# Patient Record
Sex: Female | Born: 1990 | Race: White | Hispanic: No | Marital: Single | Smoking: Never smoker
Health system: Southern US, Community
[De-identification: ages and names within clinical notes are randomized; demographics above are authoritative.]

## PROBLEM LIST (undated history)

## (undated) DIAGNOSIS — Z83719 Family history of colon polyps, unspecified: Secondary | ICD-10-CM

## (undated) DIAGNOSIS — F329 Major depressive disorder, single episode, unspecified: Secondary | ICD-10-CM

## (undated) DIAGNOSIS — Z808 Family history of malignant neoplasm of other organs or systems: Secondary | ICD-10-CM

## (undated) DIAGNOSIS — J45909 Unspecified asthma, uncomplicated: Secondary | ICD-10-CM

## (undated) DIAGNOSIS — Z8371 Family history of colonic polyps: Secondary | ICD-10-CM

## (undated) DIAGNOSIS — F419 Anxiety disorder, unspecified: Secondary | ICD-10-CM

## (undated) DIAGNOSIS — R112 Nausea with vomiting, unspecified: Secondary | ICD-10-CM

## (undated) DIAGNOSIS — K900 Celiac disease: Secondary | ICD-10-CM

## (undated) DIAGNOSIS — Z803 Family history of malignant neoplasm of breast: Secondary | ICD-10-CM

## (undated) DIAGNOSIS — T8859XA Other complications of anesthesia, initial encounter: Secondary | ICD-10-CM

## (undated) DIAGNOSIS — G43909 Migraine, unspecified, not intractable, without status migrainosus: Secondary | ICD-10-CM

## (undated) DIAGNOSIS — Z800 Family history of malignant neoplasm of digestive organs: Secondary | ICD-10-CM

## (undated) DIAGNOSIS — F32A Depression, unspecified: Secondary | ICD-10-CM

## (undated) HISTORY — DX: Family history of malignant neoplasm of other organs or systems: Z80.8

## (undated) HISTORY — DX: Depression, unspecified: F32.A

## (undated) HISTORY — DX: Family history of colonic polyps: Z83.71

## (undated) HISTORY — DX: Family history of colon polyps, unspecified: Z83.719

## (undated) HISTORY — DX: Family history of malignant neoplasm of digestive organs: Z80.0

## (undated) HISTORY — PX: WRIST SURGERY: SHX841

## (undated) HISTORY — PX: WISDOM TOOTH EXTRACTION: SHX21

## (undated) HISTORY — DX: Celiac disease: K90.0

## (undated) HISTORY — DX: Migraine, unspecified, not intractable, without status migrainosus: G43.909

## (undated) HISTORY — DX: Major depressive disorder, single episode, unspecified: F32.9

## (undated) HISTORY — DX: Family history of malignant neoplasm of breast: Z80.3

---
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
## 2008-01-07 ENCOUNTER — Ambulatory Visit: Payer: Self-pay | Admitting: Family Medicine

## 2008-01-07 DIAGNOSIS — G43909 Migraine, unspecified, not intractable, without status migrainosus: Secondary | ICD-10-CM | POA: Insufficient documentation

## 2008-07-13 ENCOUNTER — Telehealth: Payer: Self-pay | Admitting: Family Medicine

## 2008-10-14 ENCOUNTER — Ambulatory Visit: Payer: Self-pay | Admitting: Family Medicine

## 2008-10-14 ENCOUNTER — Other Ambulatory Visit: Admission: RE | Admit: 2008-10-14 | Discharge: 2008-10-14 | Payer: Self-pay | Admitting: Family Medicine

## 2008-10-14 ENCOUNTER — Encounter: Payer: Self-pay | Admitting: Family Medicine

## 2008-10-14 DIAGNOSIS — N938 Other specified abnormal uterine and vaginal bleeding: Secondary | ICD-10-CM | POA: Insufficient documentation

## 2008-10-14 DIAGNOSIS — N949 Unspecified condition associated with female genital organs and menstrual cycle: Secondary | ICD-10-CM

## 2009-01-03 ENCOUNTER — Ambulatory Visit: Payer: Self-pay | Admitting: Family Medicine

## 2009-07-21 ENCOUNTER — Telehealth: Payer: Self-pay | Admitting: Family Medicine

## 2009-10-28 ENCOUNTER — Ambulatory Visit: Payer: Self-pay | Admitting: Family Medicine

## 2009-10-28 ENCOUNTER — Other Ambulatory Visit: Admission: RE | Admit: 2009-10-28 | Discharge: 2009-10-28 | Payer: Self-pay | Admitting: Family Medicine

## 2009-10-28 DIAGNOSIS — L301 Dyshidrosis [pompholyx]: Secondary | ICD-10-CM | POA: Insufficient documentation

## 2009-10-28 LAB — CONVERTED CEMR LAB: Pap Smear: NEGATIVE

## 2010-04-18 NOTE — Progress Notes (Signed)
Summary: Refills singulair  Phone Note Refill Request Message from:  Pharmacy on Jul 21, 2009 3:34 PM  Refills Requested: Medication #1:  SINGULAIR 10 MG TABS once daily Initial call taken by: Kathrynn Speed CMA,  Jul 21, 2009 3:35 PM    Prescriptions: SINGULAIR 10 MG TABS (MONTELUKAST SODIUM) once daily  #90 x 2   Entered by:   Kathrynn Speed CMA   Authorized by:   Roderick Pee MD   Signed by:   Kathrynn Speed CMA on 07/21/2009   Method used:   Electronically to        [facility]* (retail)       [address]       [place], [place]  [address]       Ph: [phone]       Fax: [phone]   RxID:   [id number]

## 2010-04-18 NOTE — Assessment & Plan Note (Signed)
Summary: cpx/ pap/mm   Vital Signs:  Patient profile:   20 year old female Menstrual status:  regular Height:      62 inches Weight:      106 pounds BMI:     19.46 Temp:     98.6 degrees F oral BP sitting:   98 / 70  (left arm) Cuff size:   regular  Vitals Entered By: Kathrynn Speed CMA (October 28, 2009 9:24 AM) CC: cxp, pap, src Is Patient Diabetic? No   CC:  cxp, pap, and src.  History of Present Illness: Pamela Norton is a 20 year old, married female, nonsmoker, who comes in today for general physical examination because of dysfunction uterine bleeding.  Migraine headaches, allergic rhinitis.  The dysfunction uterine bleeding is treated with BCPs 135.  Doing well.  No side effects, actually it's also decreased her migraines.  She takes an occasional Maxalt, but they decreased in frequency and severity since she started the BCPs.  For her allergy symptoms.  She takes Allegra 180 daily, Singulair 10 daily, Nasacort, and Astelin sprays also, prior p.r.n.  She's also on allergy shots weekly from her allergist.  Review of systems negative except for some eczema and a stress fracture in her left ankle, which is being treated by the orthopedist.  Tetanus 2007 seasonal flu shot 2010 meningitis vaccine 2007  Preventive Screening-Counseling & Management  Alcohol-Tobacco     Smoking Status: never     Passive Smoke Exposure: no  Current Medications (verified): 1)  Maxalt-Mlt 10 Mg  Tbdp (Rizatriptan Benzoate) .... Uad As Needed 2)  Allegra 180 Mg Tabs (Fexofenadine Hcl) .... Take One Tab Once Daily 3)  Singulair 10 Mg Tabs (Montelukast Sodium) .... Once Daily 4)  Nasacort Aq 55 Mcg/act Aers (Triamcinolone Acetonide(Nasal)) .... Once Daily 5)  Astelin 137 Mcg/spray Soln (Azelastine Hcl) .... Once Daily 6)  Proair Hfa 108 (90 Base) Mcg/act Aers (Albuterol Sulfate) .... Once Daily 7)  Allergy Shots .... Once A Week 8)  Zovia 1/35e (28) 1-35 Mg-Mcg Tabs (Ethynodiol Diac-Eth Estradiol) ....  Uad  Allergies (verified): 1)  ! Ceclor  Past History:  Past medical, surgical, family and social histories (including risk factors) reviewed, and no changes noted (except as noted below).  Past Medical History: Reviewed history from 09/26/2006 and no changes required. Allergic Rhinitis Asthma Headaches  Family History: Reviewed history from 09/26/2006 and no changes required. Family History of Hypertension Fam hx DM Fam hx Fibromyalgia  Social History: Reviewed history from 01/07/2008 and no changes required. Negative history of passive tobacco smoke exposure.  Not using alcohol Not using substances of abuse student at [employer], [place], double major Investment banker, corporate.  History and drama  Review of Systems      See HPI  Physical Exam  General:  Well-developed,well-nourished,in no acute distress; alert,appropriate and cooperative throughout examination Head:  Normocephalic and atraumatic without obvious abnormalities. No apparent alopecia or balding. Eyes:  No corneal or conjunctival inflammation noted. EOMI. Perrla. Funduscopic exam benign, without hemorrhages, exudates or papilledema. Vision grossly normal. Ears:  External ear exam shows no significant lesions or deformities.  Otoscopic examination reveals clear canals, tympanic membranes are intact bilaterally without bulging, retraction, inflammation or discharge. Hearing is grossly normal bilaterally. Nose:  External nasal examination shows no deformity or inflammation. Nasal mucosa are pink and moist without lesions or exudates. Mouth:  Oral mucosa and oropharynx without lesions or exudates.  Teeth in good repair. Neck:  No deformities, masses, or tenderness noted. Chest Wall:  No  deformities, masses, or tenderness noted. Breasts:  No mass, nodules, thickening, tenderness, bulging, retraction, inflamation, nipple discharge or skin changes noted.   Lungs:  Normal respiratory effort, chest expands symmetrically.  Lungs are clear to auscultation, no crackles or wheezes. Heart:  Normal rate and regular rhythm. S1 and S2 normal without gallop, murmur, click, rub or other extra sounds. Abdomen:  Bowel sounds positive,abdomen soft and non-tender without masses, organomegaly or hernias noted. Genitalia:  Pelvic Exam:        External: normal female genitalia without lesions or masses        Vagina: normal without lesions or masses        Cervix: normal without lesions or masses        Adnexa: normal bimanual exam without masses or fullness        Uterus: normal by palpation        Pap smear: performed Msk:  No deformity or scoliosis noted of thoracic or lumbar spine.   Pulses:  R and L carotid,radial,femoral,dorsalis pedis and posterior tibial pulses are full and equal bilaterally Extremities:  No clubbing, cyanosis, edema, or deformity noted with normal full range of motion of all joints.   Neurologic:  No cranial nerve deficits noted. Station and gait are normal. Plantar reflexes are down-going bilaterally. DTRs are symmetrical throughout. Sensory, motor and coordinative functions appear intact. Skin:  Intact without suspicious lesions or rashes Cervical Nodes:  No lymphadenopathy noted Axillary Nodes:  No palpable lymphadenopathy Inguinal Nodes:  No significant adenopathy Psych:  Cognition and judgment appear intact. Alert and cooperative with normal attention span and concentration. 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 Uad as needed  Orders: Prescription Created Electronically [phone])  Problem # 3:  WELL ADULT EXAM (ICD-V70.0) Assessment:  Unchanged  Orders: Prescription Created Electronically [phone])  Problem # 4:  ASTHMA (ICD-493.90) Assessment: Improved  Her updated medication list for this problem includes:    Singulair 10 Mg Tabs (Montelukast sodium) ..... Once daily    Proair Hfa 108 (90 Base) Mcg/act Aers (Albuterol sulfate) ..... Once daily  Problem # 5:  S/P ALLERGIC RHINITIS (ICD-477.9) Assessment: Improved  Her updated medication list for this problem includes:    Allegra 180 Mg Tabs (Fexofenadine hcl) .Marland Kitchen... Take one tab once daily    Nasacort Aq 55 Mcg/act Aers (Triamcinolone acetonide(nasal)) ..... Once daily    Astelin 137 Mcg/spray Soln (Azelastine hcl) ..... Once daily  Problem # 6:  DYSHIDROSIS (ICD-705.81) Assessment: New  Orders: Prescription Created Electronically [phone])  Complete Medication List: 1)  Maxalt-mlt 10 Mg Tbdp (Rizatriptan benzoate) .... Uad as needed 2)  Allegra 180 Mg Tabs (Fexofenadine hcl) .... Take one tab once daily 3)  Singulair 10 Mg Tabs (Montelukast sodium) .... Once daily 4)  Nasacort Aq 55 Mcg/act Aers (Triamcinolone acetonide(nasal)) .... Once daily 5)  Astelin 137 Mcg/spray Soln (Azelastine hcl) .... Once daily 6)  Proair Hfa 108 (90 Base) Mcg/act Aers (Albuterol sulfate) .... Once daily 7)  Allergy Shots  .... Once a week 8)  Zovia 1/35e (28) 1-35 Mg-mcg Tabs (Ethynodiol diac-eth estradiol) .... Uad 9)  Triamcinolone Acetonide 0.1 % Oint (Triamcinolone acetonide) .... Apply two times a day as needed  Patient Instructions: 1)  use udder crm/and cortisone cream twice daily as needed for outbreaks of the skin rash. 2)  Please schedule a follow-up appointment in 1 year. Prescriptions: ZOVIA 1/35E (28) 1-35 MG-MCG TABS (ETHYNODIOL DIAC-ETH ESTRADIOL) UAD  #3 paks x 4   Entered and Authorized by:   Roderick Pee MD   Signed by:   Roderick Pee MD on 10/28/2009   Method used:   Electronically to        [facility] [phone]* (retail)       [address]        [place], [place]  [address]       Ph: [phone] or [phone]       Fax: [phone]   RxID:   [id number] MAXALT-MLT 10 MG  TBDP (RIZATRIPTAN BENZOATE) uad as needed  #6 x 11   Entered and Authorized by:   Roderick Pee MD   Signed by:   Roderick Pee MD on 10/28/2009   Method used:   Electronically to        [facility] [phone]* (retail)       [address]       [place], [place]  [address]       Ph: [phone] or [phone]       Fax: [phone]   RxID:   [id number] TRIAMCINOLONE ACETONIDE 0.1 % OINT (TRIAMCINOLONE ACETONIDE) apply two times a day as needed  #60 gr x 2   Entered and Authorized by:   Roderick Pee MD   Signed by:   Roderick Pee MD on 10/28/2009   Method used:   Electronically to        [facility] [phone]* (retail)       [address]       [place], [place]  [address]       Ph: [phone] or [phone]       Fax: [phone]   RxID:   [id number]

## 2010-08-04 NOTE — Assessment & Plan Note (Signed)
[facility]                              [facility] OFFICE NOTE   NAME:Pamela Norton, Pamela Norton                       MRN:          [id number]  DATE:10/16/2005                            DOB:          [birth year]    This is a first [facility] visit for this 20 year old female who comes in  accompanied by her mother for evaluation of numerous issues.   PAST MEDICAL HISTORY, PAST HOSPITALIZATIONS:  None.   OUTPATIENT SURGERY:  None.   ILLNESSES:  None.   INJURY:  None.   DRUG ALLERGIES:  Ceclor gives her hives.   Does not smoke or drink any alcohol or use any street drugs.   CURRENT MEDICATIONS:  1.  Allegra 180 q. day.  2.  Singulair 10 q. day.  3.  Afrin nasal spray b.i.d.  4.  Nasacort nasal spray.  5.  She also uses Atarax 10 mg on a p.r.n. basis.  6.  Albuterol p.r.n. for asthma.  7.  She says she uses maybe 2 cans of  albuterol in a 12 month period of      time.  8.  She also sees Dr. Stevphen Rochester for allergy shots.   REVIEW OF SYSTEMS:  She has migraine headaches which she would like to  discuss.  Her migraines are without ora.  They are not menstrual and they  occur maybe once to three times a year but sometimes they only last two or  three days.  She tried over-the-counter medications and they do not help and  would like to discuss prescription medicine.  HEENT:  Negative.  Regular  dental care.  CARDIOPULMONARY:  Negative.  GASTROINTESTINAL:  Negative.  GENITOURINARY:  Negative.  GYN:  Gravida 0, para 0. LMP July 17 to 21.  Menses are regular, no problems.  Minimal cramps first day.  She is not  sexually active.  ENDOCRINOLOGY:  Negative.  Weight steady.  MUSCULOSKELETAL:  Negative except she is a horse back rider and we talked  about that in relationship to her total health, head injuries, etc.  She  said she wears a helmet.  VASCULAR:  Negative.  ALLERGY:  Positive as noted  above.  SKIN:  Negative.   SOCIAL HISTORY:  She is a  Holiday representative at [employer].  Her GPA is 4.6.  She  is not sure where she is going to school after high school.   FAMILY HISTORY:  Dad has a PhD in good health.  Mother has multiple medical  problems including fibromyalgia, hypertension, hyperlipidemia, obesity and  diabetes.  No brothers or sisters.   VACCINATION HISTORY TO DATE:  Mother does not have the records but knows she  needs a DT and wants a meningitis vaccine.  She said her insurance denied  the Gardasil.   PHYSICAL EVALUATION:  VITAL SIGNS:  Height 5 foot 2 inches.  Weight 113  pounds.  Pulse was 70 and regular.  Temperature 99.  GENERAL:  She is a well developed, well nourished white female in no acute  distress.  HEENT:  Negative.  NECK:  Supple.  Thyroid is not enlarged.  CHEST:  Clear to auscultation.  CARDIAC:  Negative.  ABDOMEN:  Negative.  EXTREMITIES: No ischemia.  Peripheral pulses normal.   LABORATORY DATA:  None indicated.   IMPRESSION:  Allergic rhinitis.   PLAN:  1.  Continue Allegra 180 q. day.  2.  Asthma - continue Singulair 10 q. day.  Nasonex nasal spray, Nasacort      and allergy shots.  Also Atarax p.r.n.  Also albuterol p.r.n.  3.  Migraine headaches.  We had a discussion of migraines and she opts for      Zomig 5 mg ZMT.  She knows how to use it.  Will give her a trial.  If      this controls her migraines we will do nothing further.  If however, it      does not she will come back to see [place].                                   Jeffrey A. Tawanna Cooler, MD   JAT/MedQ  DD:  10/16/2005  DT:  10/16/2005  Job #:  [id number]

## 2011-03-23 ENCOUNTER — Other Ambulatory Visit (HOSPITAL_COMMUNITY)
Admission: RE | Admit: 2011-03-23 | Discharge: 2011-03-23 | Disposition: A | Discharge status: Home / Self Care | Payer: BC Managed Care – PPO | Source: Ambulatory Visit | Attending: Family Medicine | Admitting: Family Medicine

## 2011-03-23 DIAGNOSIS — Z124 Encounter for screening for malignant neoplasm of cervix: Secondary | ICD-10-CM | POA: Insufficient documentation

## 2011-03-28 ENCOUNTER — Other Ambulatory Visit: Payer: Self-pay | Admitting: Physician Assistant

## 2012-05-13 ENCOUNTER — Other Ambulatory Visit (HOSPITAL_COMMUNITY)
Admission: RE | Admit: 2012-05-13 | Discharge: 2012-05-13 | Disposition: A | Discharge status: Home / Self Care | Payer: BC Managed Care – PPO | Source: Ambulatory Visit | Attending: Family Medicine | Admitting: Family Medicine

## 2012-05-13 ENCOUNTER — Other Ambulatory Visit: Payer: Self-pay | Admitting: Physician Assistant

## 2012-05-13 DIAGNOSIS — Z124 Encounter for screening for malignant neoplasm of cervix: Secondary | ICD-10-CM | POA: Insufficient documentation

## 2012-09-18 ENCOUNTER — Emergency Department (HOSPITAL_COMMUNITY)
Admission: EM | Admit: 2012-09-18 | Discharge: 2012-09-18 | Disposition: A | Discharge status: Home / Self Care | Payer: BC Managed Care – PPO | Attending: Emergency Medicine | Admitting: Emergency Medicine

## 2012-09-18 ENCOUNTER — Encounter (HOSPITAL_COMMUNITY): Payer: Self-pay | Admitting: Emergency Medicine

## 2012-09-18 DIAGNOSIS — R42 Dizziness and giddiness: Secondary | ICD-10-CM | POA: Insufficient documentation

## 2012-09-18 DIAGNOSIS — J45901 Unspecified asthma with (acute) exacerbation: Secondary | ICD-10-CM | POA: Insufficient documentation

## 2012-09-18 DIAGNOSIS — R059 Cough, unspecified: Secondary | ICD-10-CM | POA: Insufficient documentation

## 2012-09-18 DIAGNOSIS — Z8659 Personal history of other mental and behavioral disorders: Secondary | ICD-10-CM | POA: Insufficient documentation

## 2012-09-18 DIAGNOSIS — R05 Cough: Secondary | ICD-10-CM | POA: Insufficient documentation

## 2012-09-18 DIAGNOSIS — R0789 Other chest pain: Secondary | ICD-10-CM | POA: Insufficient documentation

## 2012-09-18 DIAGNOSIS — J4521 Mild intermittent asthma with (acute) exacerbation: Secondary | ICD-10-CM

## 2012-09-18 HISTORY — DX: Anxiety disorder, unspecified: F41.9

## 2012-09-18 HISTORY — DX: Unspecified asthma, uncomplicated: J45.909

## 2012-09-18 MED ORDER — ALBUTEROL SULFATE (5 MG/ML) 0.5% IN NEBU
2.5000 mg | INHALATION_SOLUTION | Freq: Once | RESPIRATORY_TRACT | Status: AC
  Administered 2012-09-18: 2.5 mg via RESPIRATORY_TRACT
  Filled 2012-09-18: qty 0.5

## 2012-09-18 MED ORDER — IPRATROPIUM BROMIDE 0.02 % IN SOLN
0.5000 mg | Freq: Once | RESPIRATORY_TRACT | Status: DC
  Filled 2012-09-18: qty 2.5

## 2012-09-18 MED ORDER — DEXAMETHASONE SODIUM PHOSPHATE 10 MG/ML IJ SOLN
10.0000 mg | Freq: Once | INTRAMUSCULAR | Status: AC
  Administered 2012-09-18: 10 mg via INTRAMUSCULAR
  Filled 2012-09-18: qty 1

## 2012-09-18 NOTE — ED Notes (Addendum)
Pt reports difficulty breathing and reports that her asthma has flared up. Pt reports a history of asthma. Pt O2 saturation on room air is 98%.  Wheezing heard upon ascultation to the bilateral lung bases. NAD. Pt reports having a non-productive cough.

## 2012-09-18 NOTE — ED Notes (Signed)
After IM administration pt c/o lightheaded and dizziness. Pt laid in supine position and VS obtained. HR 73 BP 85/46. Abigail PA notified. Pt VS's to be reassessed before discharge.

## 2012-09-18 NOTE — ED Provider Notes (Signed)
History    This chart was scribed for non-physician practitioner working with Derwood Kaplan, MD by Leone Payor, ED Scribe. This patient was seen in room WTR5/WTR5 and the patient's care was started at 1708.  CSN: [id number] Arrival date & time 09/18/12  1708  None    No chief complaint on file.   The history is provided by the patient. No language interpreter was used.    HPI Comments: Pamela Norton is a 22 y.o. female with past medical history of asthma, who presents to the Emergency Department complaining of constant, unchanged, non-radiating, central chest tightness starting today at 12:30pm. Has tried to use her rescue inhaler without relief. Pain is worse with deep breathing. She has some associated lightheadedness, cough. Reports taking birth control pills. Denies being pregnant. States she has h/o anxiety but this pain is unlike that. Denies recent travel, h/o blood clots. Mother has h/o DVT/PE. Denies hemoptysis, sore throat. She has a history of anxiety and panic attack.  Pt denies having any sick contacts with similar symptoms.    No past medical history on file. No past surgical history on file. No family history on file. History  Substance Use Topics  . Smoking status: Not on file  . Smokeless tobacco: Not on file  . Alcohol Use: Not on file   OB History   No data available     Review of Systems  Constitutional: Negative for fever and chills.  HENT: Negative for congestion, sore throat, rhinorrhea, trouble swallowing, voice change and sinus pressure.   Eyes: Negative for discharge and itching.  Respiratory: Positive for cough, chest tightness and shortness of breath. Negative for wheezing and stridor.   Cardiovascular: Negative for chest pain.  Gastrointestinal: Negative for nausea, vomiting and abdominal pain.  Genitourinary: Negative for dysuria.  Musculoskeletal: Negative for myalgias and arthralgias.  Skin: Negative for rash.  Neurological: Positive for  light-headedness.    Allergies  Cefaclor  Home Medications  No current outpatient prescriptions on file. BP 123/80  Pulse 83  Temp(Src) 98.4 F (36.9 C) (Oral)  Resp 14  Ht 5\' 2"  (1.575 m)  Wt 105 lb (47.628 kg)  BMI 19.2 kg/m2  SpO2 98% Physical Exam  Nursing note and vitals reviewed. Constitutional: She is oriented to person, place, and time. She appears well-developed and well-nourished. No distress.  HENT:  Head: Normocephalic and atraumatic.  Eyes: EOM are normal.  Neck: Normal range of motion. Neck supple. No tracheal deviation present.  Cardiovascular: Normal rate, regular rhythm and normal heart sounds.   Pulmonary/Chest: Effort normal. No respiratory distress. She exhibits no tenderness.  Dry cough noted.   Rhonchi noted. Upper airway clear with cough. Very mild expiratory wheezes. No increased respiratory effort or use of accessory muscles.   Musculoskeletal: Normal range of motion.  Lymphadenopathy:    She has no cervical adenopathy.  Neurological: She is alert and oriented to person, place, and time.  Skin: Skin is warm and dry.  Multiple, well-healed scars that appear to self-inflicted from superficial lacerations to the L anterior forearm.   Psychiatric: She has a normal mood and affect. Her behavior is normal.    ED Course  Procedures (including critical care time)  DIAGNOSTIC STUDIES: Oxygen Saturation is 98% on RA, normal by my interpretation.    COORDINATION OF CARE: 5:38 PM Discussed treatment plan with pt at bedside and pt agreed to plan.   Labs Reviewed - No data to display No results found. 1. Asthma exacerbation, mild  intermittent     MDM  no current signs of respiratory distress. Lung exam improved after nebulizer treatment. Patient feels better. Decadron given in the ED. Pt states they are breathing at baseline. Pt has been instructed to continue using prescribed medications and to speak with PCP about today's exacerbation.    I  personally performed the services described in this documentation, which was scribed in my presence. The recorded information has been reviewed and is accurate.    Arthor Captain, PA-C 09/25/12 1339

## 2012-09-26 NOTE — ED Provider Notes (Signed)
Medical screening examination/treatment/procedure(s) were performed by non-physician practitioner and as supervising physician I was immediately available for consultation/collaboration.  Carle Fenech, MD 09/26/12 0728 

## 2012-10-01 ENCOUNTER — Other Ambulatory Visit: Payer: Self-pay | Admitting: Allergy and Immunology

## 2012-10-01 ENCOUNTER — Other Ambulatory Visit: Payer: Self-pay

## 2012-10-01 ENCOUNTER — Ambulatory Visit
Admission: RE | Admit: 2012-10-01 | Discharge: 2012-10-01 | Disposition: A | Discharge status: Home / Self Care | Payer: BC Managed Care – PPO | Source: Ambulatory Visit | Attending: Allergy and Immunology | Admitting: Allergy and Immunology

## 2012-10-01 DIAGNOSIS — R079 Chest pain, unspecified: Secondary | ICD-10-CM

## 2012-10-01 DIAGNOSIS — R05 Cough: Secondary | ICD-10-CM

## 2012-10-01 DIAGNOSIS — R059 Cough, unspecified: Secondary | ICD-10-CM

## 2013-05-26 ENCOUNTER — Other Ambulatory Visit: Payer: Self-pay | Admitting: Physician Assistant

## 2013-05-26 ENCOUNTER — Other Ambulatory Visit (HOSPITAL_COMMUNITY)
Admission: RE | Admit: 2013-05-26 | Discharge: 2013-05-26 | Disposition: A | Discharge status: Home / Self Care | Payer: BC Managed Care – PPO | Source: Ambulatory Visit | Attending: Family Medicine | Admitting: Family Medicine

## 2013-05-26 DIAGNOSIS — Z124 Encounter for screening for malignant neoplasm of cervix: Secondary | ICD-10-CM | POA: Insufficient documentation

## 2014-05-28 ENCOUNTER — Other Ambulatory Visit (HOSPITAL_COMMUNITY)
Admission: RE | Admit: 2014-05-28 | Discharge: 2014-05-28 | Disposition: A | Discharge status: Home / Self Care | Payer: BLUE CROSS/BLUE SHIELD | Source: Ambulatory Visit | Attending: Family Medicine | Admitting: Family Medicine

## 2014-05-28 ENCOUNTER — Other Ambulatory Visit: Payer: Self-pay | Admitting: Physician Assistant

## 2014-05-28 DIAGNOSIS — Z124 Encounter for screening for malignant neoplasm of cervix: Secondary | ICD-10-CM | POA: Insufficient documentation

## 2014-06-01 LAB — CYTOLOGY - PAP

## 2015-02-22 DIAGNOSIS — M654 Radial styloid tenosynovitis [de Quervain]: Secondary | ICD-10-CM | POA: Insufficient documentation

## 2015-02-25 ENCOUNTER — Other Ambulatory Visit: Payer: Self-pay | Admitting: Allergy and Immunology

## 2015-02-25 DIAGNOSIS — J0191 Acute recurrent sinusitis, unspecified: Secondary | ICD-10-CM

## 2015-03-08 ENCOUNTER — Ambulatory Visit
Admission: RE | Admit: 2015-03-08 | Discharge: 2015-03-08 | Disposition: A | Discharge status: Home / Self Care | Payer: BLUE CROSS/BLUE SHIELD | Source: Ambulatory Visit | Attending: Allergy and Immunology | Admitting: Allergy and Immunology

## 2015-03-08 DIAGNOSIS — J0191 Acute recurrent sinusitis, unspecified: Secondary | ICD-10-CM

## 2015-06-09 ENCOUNTER — Other Ambulatory Visit: Payer: Self-pay | Admitting: Physician Assistant

## 2015-06-09 ENCOUNTER — Other Ambulatory Visit (HOSPITAL_COMMUNITY)
Admission: RE | Admit: 2015-06-09 | Discharge: 2015-06-09 | Disposition: A | Discharge status: Home / Self Care | Payer: BLUE CROSS/BLUE SHIELD | Source: Ambulatory Visit | Attending: Family Medicine | Admitting: Family Medicine

## 2015-06-09 DIAGNOSIS — Z124 Encounter for screening for malignant neoplasm of cervix: Secondary | ICD-10-CM | POA: Insufficient documentation

## 2015-06-13 LAB — CYTOLOGY - PAP

## 2015-06-22 DIAGNOSIS — J3081 Allergic rhinitis due to animal (cat) (dog) hair and dander: Secondary | ICD-10-CM | POA: Diagnosis not present

## 2015-06-22 DIAGNOSIS — J3089 Other allergic rhinitis: Secondary | ICD-10-CM | POA: Diagnosis not present

## 2015-06-22 DIAGNOSIS — J301 Allergic rhinitis due to pollen: Secondary | ICD-10-CM | POA: Diagnosis not present

## 2015-07-04 DIAGNOSIS — K900 Celiac disease: Secondary | ICD-10-CM | POA: Diagnosis not present

## 2015-07-06 DIAGNOSIS — J3089 Other allergic rhinitis: Secondary | ICD-10-CM | POA: Diagnosis not present

## 2015-07-06 DIAGNOSIS — J301 Allergic rhinitis due to pollen: Secondary | ICD-10-CM | POA: Diagnosis not present

## 2015-07-06 DIAGNOSIS — J3081 Allergic rhinitis due to animal (cat) (dog) hair and dander: Secondary | ICD-10-CM | POA: Diagnosis not present

## 2015-07-13 DIAGNOSIS — J3081 Allergic rhinitis due to animal (cat) (dog) hair and dander: Secondary | ICD-10-CM | POA: Diagnosis not present

## 2015-07-13 DIAGNOSIS — J301 Allergic rhinitis due to pollen: Secondary | ICD-10-CM | POA: Diagnosis not present

## 2015-07-13 DIAGNOSIS — J3089 Other allergic rhinitis: Secondary | ICD-10-CM | POA: Diagnosis not present

## 2015-07-20 DIAGNOSIS — J301 Allergic rhinitis due to pollen: Secondary | ICD-10-CM | POA: Diagnosis not present

## 2015-07-20 DIAGNOSIS — J3089 Other allergic rhinitis: Secondary | ICD-10-CM | POA: Diagnosis not present

## 2015-07-20 DIAGNOSIS — J3081 Allergic rhinitis due to animal (cat) (dog) hair and dander: Secondary | ICD-10-CM | POA: Diagnosis not present

## 2015-07-27 DIAGNOSIS — J3089 Other allergic rhinitis: Secondary | ICD-10-CM | POA: Diagnosis not present

## 2015-07-27 DIAGNOSIS — J301 Allergic rhinitis due to pollen: Secondary | ICD-10-CM | POA: Diagnosis not present

## 2015-07-27 DIAGNOSIS — J3081 Allergic rhinitis due to animal (cat) (dog) hair and dander: Secondary | ICD-10-CM | POA: Diagnosis not present

## 2015-08-03 DIAGNOSIS — J3081 Allergic rhinitis due to animal (cat) (dog) hair and dander: Secondary | ICD-10-CM | POA: Diagnosis not present

## 2015-08-03 DIAGNOSIS — J301 Allergic rhinitis due to pollen: Secondary | ICD-10-CM | POA: Diagnosis not present

## 2015-08-03 DIAGNOSIS — J3089 Other allergic rhinitis: Secondary | ICD-10-CM | POA: Diagnosis not present

## 2015-08-10 DIAGNOSIS — J301 Allergic rhinitis due to pollen: Secondary | ICD-10-CM | POA: Diagnosis not present

## 2015-08-10 DIAGNOSIS — J3089 Other allergic rhinitis: Secondary | ICD-10-CM | POA: Diagnosis not present

## 2015-08-10 DIAGNOSIS — J3081 Allergic rhinitis due to animal (cat) (dog) hair and dander: Secondary | ICD-10-CM | POA: Diagnosis not present

## 2015-08-17 DIAGNOSIS — J301 Allergic rhinitis due to pollen: Secondary | ICD-10-CM | POA: Diagnosis not present

## 2015-08-17 DIAGNOSIS — J3089 Other allergic rhinitis: Secondary | ICD-10-CM | POA: Diagnosis not present

## 2015-08-17 DIAGNOSIS — J3081 Allergic rhinitis due to animal (cat) (dog) hair and dander: Secondary | ICD-10-CM | POA: Diagnosis not present

## 2015-08-18 ENCOUNTER — Other Ambulatory Visit: Payer: Self-pay | Admitting: Physician Assistant

## 2015-08-18 ENCOUNTER — Ambulatory Visit
Admission: RE | Admit: 2015-08-18 | Discharge: 2015-08-18 | Disposition: A | Discharge status: Home / Self Care | Payer: BLUE CROSS/BLUE SHIELD | Source: Ambulatory Visit | Attending: Physician Assistant | Admitting: Physician Assistant

## 2015-08-18 DIAGNOSIS — N643 Galactorrhea not associated with childbirth: Secondary | ICD-10-CM | POA: Diagnosis not present

## 2015-08-18 DIAGNOSIS — R05 Cough: Secondary | ICD-10-CM

## 2015-08-18 DIAGNOSIS — R059 Cough, unspecified: Secondary | ICD-10-CM

## 2015-08-18 DIAGNOSIS — O926 Galactorrhea: Secondary | ICD-10-CM | POA: Diagnosis not present

## 2015-08-24 DIAGNOSIS — J3081 Allergic rhinitis due to animal (cat) (dog) hair and dander: Secondary | ICD-10-CM | POA: Diagnosis not present

## 2015-08-24 DIAGNOSIS — J3089 Other allergic rhinitis: Secondary | ICD-10-CM | POA: Diagnosis not present

## 2015-08-24 DIAGNOSIS — J301 Allergic rhinitis due to pollen: Secondary | ICD-10-CM | POA: Diagnosis not present

## 2015-08-26 DIAGNOSIS — J4540 Moderate persistent asthma, uncomplicated: Secondary | ICD-10-CM | POA: Diagnosis not present

## 2015-08-26 DIAGNOSIS — J3081 Allergic rhinitis due to animal (cat) (dog) hair and dander: Secondary | ICD-10-CM | POA: Diagnosis not present

## 2015-08-26 DIAGNOSIS — J0191 Acute recurrent sinusitis, unspecified: Secondary | ICD-10-CM | POA: Diagnosis not present

## 2015-08-26 DIAGNOSIS — J301 Allergic rhinitis due to pollen: Secondary | ICD-10-CM | POA: Diagnosis not present

## 2015-09-02 DIAGNOSIS — J3089 Other allergic rhinitis: Secondary | ICD-10-CM | POA: Diagnosis not present

## 2015-09-02 DIAGNOSIS — J3081 Allergic rhinitis due to animal (cat) (dog) hair and dander: Secondary | ICD-10-CM | POA: Diagnosis not present

## 2015-09-02 DIAGNOSIS — J301 Allergic rhinitis due to pollen: Secondary | ICD-10-CM | POA: Diagnosis not present

## 2015-09-09 DIAGNOSIS — J3081 Allergic rhinitis due to animal (cat) (dog) hair and dander: Secondary | ICD-10-CM | POA: Diagnosis not present

## 2015-09-09 DIAGNOSIS — J3089 Other allergic rhinitis: Secondary | ICD-10-CM | POA: Diagnosis not present

## 2015-09-09 DIAGNOSIS — J301 Allergic rhinitis due to pollen: Secondary | ICD-10-CM | POA: Diagnosis not present

## 2015-09-16 DIAGNOSIS — J301 Allergic rhinitis due to pollen: Secondary | ICD-10-CM | POA: Diagnosis not present

## 2015-09-16 DIAGNOSIS — J3081 Allergic rhinitis due to animal (cat) (dog) hair and dander: Secondary | ICD-10-CM | POA: Diagnosis not present

## 2015-09-16 DIAGNOSIS — J3089 Other allergic rhinitis: Secondary | ICD-10-CM | POA: Diagnosis not present

## 2015-09-21 DIAGNOSIS — J3089 Other allergic rhinitis: Secondary | ICD-10-CM | POA: Diagnosis not present

## 2015-09-21 DIAGNOSIS — J301 Allergic rhinitis due to pollen: Secondary | ICD-10-CM | POA: Diagnosis not present

## 2015-09-21 DIAGNOSIS — J3081 Allergic rhinitis due to animal (cat) (dog) hair and dander: Secondary | ICD-10-CM | POA: Diagnosis not present

## 2015-09-26 DIAGNOSIS — J3089 Other allergic rhinitis: Secondary | ICD-10-CM | POA: Diagnosis not present

## 2015-09-26 DIAGNOSIS — J3081 Allergic rhinitis due to animal (cat) (dog) hair and dander: Secondary | ICD-10-CM | POA: Diagnosis not present

## 2015-09-26 DIAGNOSIS — J301 Allergic rhinitis due to pollen: Secondary | ICD-10-CM | POA: Diagnosis not present

## 2015-10-04 DIAGNOSIS — J45909 Unspecified asthma, uncomplicated: Secondary | ICD-10-CM | POA: Diagnosis not present

## 2015-10-04 DIAGNOSIS — R0602 Shortness of breath: Secondary | ICD-10-CM | POA: Diagnosis not present

## 2015-10-04 DIAGNOSIS — J309 Allergic rhinitis, unspecified: Secondary | ICD-10-CM | POA: Diagnosis not present

## 2015-10-11 DIAGNOSIS — J301 Allergic rhinitis due to pollen: Secondary | ICD-10-CM | POA: Diagnosis not present

## 2015-10-11 DIAGNOSIS — J4540 Moderate persistent asthma, uncomplicated: Secondary | ICD-10-CM | POA: Diagnosis not present

## 2015-10-11 DIAGNOSIS — J3089 Other allergic rhinitis: Secondary | ICD-10-CM | POA: Diagnosis not present

## 2015-10-11 DIAGNOSIS — J3081 Allergic rhinitis due to animal (cat) (dog) hair and dander: Secondary | ICD-10-CM | POA: Diagnosis not present

## 2015-10-20 DIAGNOSIS — J301 Allergic rhinitis due to pollen: Secondary | ICD-10-CM | POA: Diagnosis not present

## 2015-10-20 DIAGNOSIS — J3089 Other allergic rhinitis: Secondary | ICD-10-CM | POA: Diagnosis not present

## 2015-10-20 DIAGNOSIS — J3081 Allergic rhinitis due to animal (cat) (dog) hair and dander: Secondary | ICD-10-CM | POA: Diagnosis not present

## 2015-10-25 DIAGNOSIS — L918 Other hypertrophic disorders of the skin: Secondary | ICD-10-CM | POA: Diagnosis not present

## 2015-10-25 DIAGNOSIS — I781 Nevus, non-neoplastic: Secondary | ICD-10-CM | POA: Diagnosis not present

## 2015-10-25 DIAGNOSIS — Z1283 Encounter for screening for malignant neoplasm of skin: Secondary | ICD-10-CM | POA: Diagnosis not present

## 2015-10-27 DIAGNOSIS — J3089 Other allergic rhinitis: Secondary | ICD-10-CM | POA: Diagnosis not present

## 2015-10-27 DIAGNOSIS — J301 Allergic rhinitis due to pollen: Secondary | ICD-10-CM | POA: Diagnosis not present

## 2015-10-27 DIAGNOSIS — J3081 Allergic rhinitis due to animal (cat) (dog) hair and dander: Secondary | ICD-10-CM | POA: Diagnosis not present

## 2015-11-03 DIAGNOSIS — J3081 Allergic rhinitis due to animal (cat) (dog) hair and dander: Secondary | ICD-10-CM | POA: Diagnosis not present

## 2015-11-03 DIAGNOSIS — J3089 Other allergic rhinitis: Secondary | ICD-10-CM | POA: Diagnosis not present

## 2015-11-03 DIAGNOSIS — J301 Allergic rhinitis due to pollen: Secondary | ICD-10-CM | POA: Diagnosis not present

## 2015-11-10 DIAGNOSIS — J3089 Other allergic rhinitis: Secondary | ICD-10-CM | POA: Diagnosis not present

## 2015-11-10 DIAGNOSIS — J3081 Allergic rhinitis due to animal (cat) (dog) hair and dander: Secondary | ICD-10-CM | POA: Diagnosis not present

## 2015-11-10 DIAGNOSIS — J301 Allergic rhinitis due to pollen: Secondary | ICD-10-CM | POA: Diagnosis not present

## 2015-11-15 DIAGNOSIS — J3089 Other allergic rhinitis: Secondary | ICD-10-CM | POA: Diagnosis not present

## 2015-11-17 DIAGNOSIS — J3081 Allergic rhinitis due to animal (cat) (dog) hair and dander: Secondary | ICD-10-CM | POA: Diagnosis not present

## 2015-11-17 DIAGNOSIS — J3089 Other allergic rhinitis: Secondary | ICD-10-CM | POA: Diagnosis not present

## 2015-11-17 DIAGNOSIS — J301 Allergic rhinitis due to pollen: Secondary | ICD-10-CM | POA: Diagnosis not present

## 2015-11-23 DIAGNOSIS — J3081 Allergic rhinitis due to animal (cat) (dog) hair and dander: Secondary | ICD-10-CM | POA: Diagnosis not present

## 2015-11-23 DIAGNOSIS — J301 Allergic rhinitis due to pollen: Secondary | ICD-10-CM | POA: Diagnosis not present

## 2015-11-23 DIAGNOSIS — J3089 Other allergic rhinitis: Secondary | ICD-10-CM | POA: Diagnosis not present

## 2015-11-30 DIAGNOSIS — J3081 Allergic rhinitis due to animal (cat) (dog) hair and dander: Secondary | ICD-10-CM | POA: Diagnosis not present

## 2015-11-30 DIAGNOSIS — J301 Allergic rhinitis due to pollen: Secondary | ICD-10-CM | POA: Diagnosis not present

## 2015-11-30 DIAGNOSIS — J3089 Other allergic rhinitis: Secondary | ICD-10-CM | POA: Diagnosis not present

## 2015-12-15 DIAGNOSIS — J3089 Other allergic rhinitis: Secondary | ICD-10-CM | POA: Diagnosis not present

## 2015-12-15 DIAGNOSIS — J301 Allergic rhinitis due to pollen: Secondary | ICD-10-CM | POA: Diagnosis not present

## 2015-12-15 DIAGNOSIS — J3081 Allergic rhinitis due to animal (cat) (dog) hair and dander: Secondary | ICD-10-CM | POA: Diagnosis not present

## 2015-12-20 DIAGNOSIS — J301 Allergic rhinitis due to pollen: Secondary | ICD-10-CM | POA: Diagnosis not present

## 2015-12-20 DIAGNOSIS — J3081 Allergic rhinitis due to animal (cat) (dog) hair and dander: Secondary | ICD-10-CM | POA: Diagnosis not present

## 2015-12-27 DIAGNOSIS — F339 Major depressive disorder, recurrent, unspecified: Secondary | ICD-10-CM | POA: Diagnosis not present

## 2015-12-27 DIAGNOSIS — R51 Headache: Secondary | ICD-10-CM | POA: Diagnosis not present

## 2015-12-29 DIAGNOSIS — J3089 Other allergic rhinitis: Secondary | ICD-10-CM | POA: Diagnosis not present

## 2015-12-29 DIAGNOSIS — J3081 Allergic rhinitis due to animal (cat) (dog) hair and dander: Secondary | ICD-10-CM | POA: Diagnosis not present

## 2015-12-29 DIAGNOSIS — J301 Allergic rhinitis due to pollen: Secondary | ICD-10-CM | POA: Diagnosis not present

## 2016-01-03 DIAGNOSIS — J3081 Allergic rhinitis due to animal (cat) (dog) hair and dander: Secondary | ICD-10-CM | POA: Diagnosis not present

## 2016-01-03 DIAGNOSIS — J3089 Other allergic rhinitis: Secondary | ICD-10-CM | POA: Diagnosis not present

## 2016-01-03 DIAGNOSIS — J301 Allergic rhinitis due to pollen: Secondary | ICD-10-CM | POA: Diagnosis not present

## 2016-01-11 DIAGNOSIS — J3081 Allergic rhinitis due to animal (cat) (dog) hair and dander: Secondary | ICD-10-CM | POA: Diagnosis not present

## 2016-01-11 DIAGNOSIS — J3089 Other allergic rhinitis: Secondary | ICD-10-CM | POA: Diagnosis not present

## 2016-01-11 DIAGNOSIS — J301 Allergic rhinitis due to pollen: Secondary | ICD-10-CM | POA: Diagnosis not present

## 2016-01-18 DIAGNOSIS — J3089 Other allergic rhinitis: Secondary | ICD-10-CM | POA: Diagnosis not present

## 2016-01-18 DIAGNOSIS — J301 Allergic rhinitis due to pollen: Secondary | ICD-10-CM | POA: Diagnosis not present

## 2016-01-18 DIAGNOSIS — J3081 Allergic rhinitis due to animal (cat) (dog) hair and dander: Secondary | ICD-10-CM | POA: Diagnosis not present

## 2016-02-02 DIAGNOSIS — J3089 Other allergic rhinitis: Secondary | ICD-10-CM | POA: Diagnosis not present

## 2016-02-02 DIAGNOSIS — J301 Allergic rhinitis due to pollen: Secondary | ICD-10-CM | POA: Diagnosis not present

## 2016-02-02 DIAGNOSIS — J3081 Allergic rhinitis due to animal (cat) (dog) hair and dander: Secondary | ICD-10-CM | POA: Diagnosis not present

## 2016-02-08 DIAGNOSIS — J3081 Allergic rhinitis due to animal (cat) (dog) hair and dander: Secondary | ICD-10-CM | POA: Diagnosis not present

## 2016-02-08 DIAGNOSIS — J3089 Other allergic rhinitis: Secondary | ICD-10-CM | POA: Diagnosis not present

## 2016-02-08 DIAGNOSIS — J301 Allergic rhinitis due to pollen: Secondary | ICD-10-CM | POA: Diagnosis not present

## 2016-02-13 ENCOUNTER — Encounter: Payer: Self-pay | Admitting: Obstetrics and Gynecology

## 2016-02-13 ENCOUNTER — Ambulatory Visit (INDEPENDENT_AMBULATORY_CARE_PROVIDER_SITE_OTHER): Payer: BLUE CROSS/BLUE SHIELD | Admitting: Obstetrics and Gynecology

## 2016-02-13 VITALS — BP 108/60 | HR 100 | Resp 15 | Ht 62.25 in | Wt 131.0 lb

## 2016-02-13 DIAGNOSIS — L659 Nonscarring hair loss, unspecified: Secondary | ICD-10-CM | POA: Diagnosis not present

## 2016-02-13 DIAGNOSIS — R5383 Other fatigue: Secondary | ICD-10-CM | POA: Diagnosis not present

## 2016-02-13 DIAGNOSIS — N915 Oligomenorrhea, unspecified: Secondary | ICD-10-CM | POA: Diagnosis not present

## 2016-02-13 DIAGNOSIS — O926 Galactorrhea: Secondary | ICD-10-CM

## 2016-02-13 DIAGNOSIS — N643 Galactorrhea not associated with childbirth: Secondary | ICD-10-CM

## 2016-02-13 LAB — COMPREHENSIVE METABOLIC PANEL
ALK PHOS: 45 U/L (ref 33–115)
ALT: 13 U/L (ref 6–29)
AST: 18 U/L (ref 10–30)
Albumin: 4.3 g/dL (ref 3.6–5.1)
BUN: 9 mg/dL (ref 7–25)
CO2: 26 mmol/L (ref 20–31)
CREATININE: 0.64 mg/dL (ref 0.50–1.10)
Calcium: 9.6 mg/dL (ref 8.6–10.2)
Chloride: 106 mmol/L (ref 98–110)
Glucose, Bld: 81 mg/dL (ref 65–99)
Potassium: 4.4 mmol/L (ref 3.5–5.3)
SODIUM: 141 mmol/L (ref 135–146)
TOTAL PROTEIN: 7.1 g/dL (ref 6.1–8.1)
Total Bilirubin: 0.3 mg/dL (ref 0.2–1.2)

## 2016-02-13 LAB — CBC
HEMATOCRIT: 40.7 % (ref 35.0–45.0)
HEMOGLOBIN: 13.5 g/dL (ref 11.7–15.5)
MCH: 30.3 pg (ref 27.0–33.0)
MCHC: 33.2 g/dL (ref 32.0–36.0)
MCV: 91.3 fL (ref 80.0–100.0)
MPV: 8.9 fL (ref 7.5–12.5)
Platelets: 369 10*3/uL (ref 140–400)
RBC: 4.46 MIL/uL (ref 3.80–5.10)
RDW: 12.6 % (ref 11.0–15.0)
WBC: 7.2 10*3/uL (ref 3.8–10.8)

## 2016-02-13 NOTE — Progress Notes (Signed)
25 y.o. G0P0000 SingleCaucasianF here c/o bilateral nipple discharge and hair loss.   The nipple d/c started in May. She noticed accidentally when she bumped her breast. She only notices it when she checks for it. Milky white d/c, multiple ducts, bilaterally, more on the left.  No change in baseline headaches, no visual changes.  [facility] She has noticed some bald spots. She had an undetectable prolactin level and a normal TSH in 5/17. Severe fatigue all the time.  Period Cycle (Days): 28 Period Duration (Days): 2 days  Period Pattern: Regular Menstrual Flow: Light Menstrual Control: Thin pad Dysmenorrhea: (!) Severe Dysmenorrhea Symptoms: Headache  Sometimes she skips her cycle, 3-4 x a year she skips one cycle. Has always been like this. She is on OCP's.  She was on Celexa for 6 years, recently switched to Prozac about 1.5 weeks ago (because of hair loss). No significant acne, no hair loss.   Patient's last menstrual period was 01/24/2016.          Sexually active: No.  The current method of family planning is OCP (estrogen/progesterone).    Exercising: Yes.    walking/ pure barr Smoker:  no  Health Maintenance: Pap:  06-09-15 WNL  History of abnormal Pap:  no TdaP: 10-16-05 Gardasil: no    reports that she has never smoked. She has never used smokeless tobacco. She reports that she drinks about 0.6 oz of alcohol per week . She reports that she does not use drugs. 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   Past Medical History:  Diagnosis Date  . Anxiety   . Asthma   . Celiac disease   . Depression     Past Surgical History:  Procedure Laterality Date  . WISDOM TOOTH EXTRACTION    . 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 albuterol (ACCUNEB) 0.63 MG/3ML nebulizer solution Take 1 ampule by  nebulization every 6 (six) hours as needed for Wheezing.    Marland Kitchen. BIOTIN PO Take 500 mg by mouth daily.    . budesonide-formoterol (SYMBICORT) 160-4.5 MCG/ACT inhaler USE 2 PUFFS TWICE A DAY    . eletriptan (RELPAX) 40 MG tablet Take 40 mg by mouth as needed for migraine or headache. May repeat in 2 hours if headache persists or recurs.    Marland Kitchen. EPINEPHrine 0.3 mg/0.3 mL IJ SOAJ injection See admin instructions.  0  . ethynodiol-ethinyl estradiol [place](KELNOR 1/35) 1-35 MG-MCG tablet     . FLUoxetine (PROZAC) 20 MG capsule Take 20 mg by mouth every morning.  2  . levocetirizine (XYZAL) 5 MG tablet Take 5 mg by mouth every evening.  2  . montelukast (SINGULAIR) 10 MG tablet Take 10 mg by mouth at bedtime.    . Multiple Vitamin (MULTIVITAMIN) capsule Take 1 capsule by mouth daily.    . ranitidine (ZANTAC) 150 MG tablet Take 150 mg by mouth 2 (two) times daily.  1  . triamcinolone (NASACORT ALLERGY 24HR) 55 MCG/ACT AERO nasal inhaler Place 2 sprays into the nose daily.     No current facility-administered medications for this visit.     Family History  Problem Relation Age of Onset  . Thyroid disease Mother   . Hypothyroidism Mother   . Osteoarthritis Mother   . Fibromyalgia Mother   . Restless legs syndrome Mother   .  Colon cancer Maternal Grandmother   . Kidney cancer Maternal Grandfather     Review of Systems  Constitutional: Negative.   HENT: Negative.   Eyes: Negative.   Respiratory: Negative.   Cardiovascular: Negative.   Gastrointestinal: Negative.   Endocrine: Negative.        Hair loss  Genitourinary: Negative.        Bilateral nipple discharge   Musculoskeletal: Negative.   Skin: Negative.   Allergic/Immunologic: Negative.   Neurological: Negative.   Psychiatric/Behavioral: Negative.     Exam:   BP 108/60 (BP Location: Right Arm, Patient Position: Sitting, Cuff Size: Normal)   Pulse 100   Resp 15   Ht 5' 2.25" (1.581 m)   Wt 131 lb (59.4 kg)   LMP 01/24/2016   BMI 23.77  kg/m   Weight change: @WEIGHTCHANGE @ Height:   Height: 5' 2.25" (158.1 cm)  Ht Readings from Last 3 Encounters:  02/13/16 5' 2.25" (1.581 m)  09/18/12 5\' 2"  (1.575 m)  10/28/09 5\' 2"  (1.575 m)    General appearance: alert, cooperative and appears stated age Head: Normocephalic, without obvious abnormality, atraumatic Neck: no adenopathy, supple, symmetrical, trachea midline and thyroid normal to inspection and palpation Lungs: clear to auscultation bilaterally Breasts: normal appearance, no masses or tenderness, bilateral nipples express milky d/c. Appears in one duct on each side, the patient states typically muliple ducts Heart: regular rate and rhythm Abdomen: soft, non-tender; bowel sounds normal; no masses,  no organomegaly Extremities: extremities normal, atraumatic, no cyanosis or edema Skin: Skin color, texture, turgor normal. No rashes or lesions. She does have thinning of the hair on the back of her head diffusely (when compared to the front). No bald spots noted.  Lymph nodes: Cervical, supraclavicular, and axillary nodes normal. No abnormal inguinal nodes palpated Neurologic: Grossly normal    A:  Galactorrhea, prior undetectable prolactin and normal TSH in 5/17  She was on Celexa which is rarely associated with galactorrhea  Recent problems with hair loss  Severe fatigue  P:   TSH, Prolactin, total testosterone, DHEAS, BhcG, CMP  Further plans after labs return  Recommend she make an appointment with dermatology for hair loss  Don't try and stimulate nipple d/c  Information on galactorrhea given

## 2016-02-13 NOTE — Patient Instructions (Signed)
Galactorrhea Introduction Galactorrhea is the flow of a milky fluid (discharge) from the breast. It is different from normal milk in nursing mothers. It can be caused by many things. Most cases are not serious and do not require treatment. Watch your condition to make sure it goes away. Follow these instructions at home:  Take medicines only as told by your doctor.  Do not squeeze your breasts or nipples.  Avoid any touching of your breasts during sexual activity.  Perform a breast self-exam only once a month.  Avoid clothes that rub on your nipples.  Use breast pads to absorb the fluid.  Wear a support bra or a breast binder.  Keep all follow-up visits as told by your doctor. This is important. Contact a doctor if:  You have hot flashes.  You have vaginal dryness.  You have a lack of sexual desire.  You stop having menstrual periods, or they are far apart or not regular.  You have headaches.  You have vision problems. Get help right away if:  Your breast discharge is bloody or yellowish white (puslike).  You have breast pain.  You feel a lump in your breast.  Your breast shows wrinkling or dimpling.  Your breast becomes red and swollen. This information is not intended to replace advice given to you by your health care provider. Make sure you discuss any questions you have with your health care provider. Document Released: 04/07/2010 Document Revised: 08/11/2015 Document Reviewed: 10/06/2013  2017 Elsevier

## 2016-02-14 LAB — PROLACTIN: Prolactin: 10.9 ng/mL

## 2016-02-14 LAB — DHEA-SULFATE: DHEA-SO4: 50 ug/dL (ref 18–391)

## 2016-02-14 LAB — TSH: TSH: 1.98 mIU/L

## 2016-02-14 LAB — HCG, QUANTITATIVE, PREGNANCY

## 2016-02-15 LAB — TESTOSTERONE, TOTAL, LC/MS/MS: Testosterone, Total, LC-MS-MS: 4 ng/dL (ref 2–45)

## 2016-02-16 DIAGNOSIS — J3081 Allergic rhinitis due to animal (cat) (dog) hair and dander: Secondary | ICD-10-CM | POA: Diagnosis not present

## 2016-02-16 DIAGNOSIS — J3089 Other allergic rhinitis: Secondary | ICD-10-CM | POA: Diagnosis not present

## 2016-02-16 DIAGNOSIS — J301 Allergic rhinitis due to pollen: Secondary | ICD-10-CM | POA: Diagnosis not present

## 2016-02-21 DIAGNOSIS — L650 Telogen effluvium: Secondary | ICD-10-CM | POA: Diagnosis not present

## 2016-02-23 DIAGNOSIS — J3089 Other allergic rhinitis: Secondary | ICD-10-CM | POA: Diagnosis not present

## 2016-02-23 DIAGNOSIS — J301 Allergic rhinitis due to pollen: Secondary | ICD-10-CM | POA: Diagnosis not present

## 2016-02-23 DIAGNOSIS — J3081 Allergic rhinitis due to animal (cat) (dog) hair and dander: Secondary | ICD-10-CM | POA: Diagnosis not present

## 2016-03-01 DIAGNOSIS — J3089 Other allergic rhinitis: Secondary | ICD-10-CM | POA: Diagnosis not present

## 2016-03-01 DIAGNOSIS — J301 Allergic rhinitis due to pollen: Secondary | ICD-10-CM | POA: Diagnosis not present

## 2016-03-01 DIAGNOSIS — J3081 Allergic rhinitis due to animal (cat) (dog) hair and dander: Secondary | ICD-10-CM | POA: Diagnosis not present

## 2016-03-06 DIAGNOSIS — J4540 Moderate persistent asthma, uncomplicated: Secondary | ICD-10-CM | POA: Diagnosis not present

## 2016-03-06 DIAGNOSIS — J301 Allergic rhinitis due to pollen: Secondary | ICD-10-CM | POA: Diagnosis not present

## 2016-03-06 DIAGNOSIS — J3089 Other allergic rhinitis: Secondary | ICD-10-CM | POA: Diagnosis not present

## 2016-03-06 DIAGNOSIS — J3081 Allergic rhinitis due to animal (cat) (dog) hair and dander: Secondary | ICD-10-CM | POA: Diagnosis not present

## 2016-03-07 DIAGNOSIS — M222X2 Patellofemoral disorders, left knee: Secondary | ICD-10-CM | POA: Diagnosis not present

## 2016-03-07 DIAGNOSIS — M222X1 Patellofemoral disorders, right knee: Secondary | ICD-10-CM | POA: Diagnosis not present

## 2016-03-16 DIAGNOSIS — J3081 Allergic rhinitis due to animal (cat) (dog) hair and dander: Secondary | ICD-10-CM | POA: Diagnosis not present

## 2016-03-16 DIAGNOSIS — J3089 Other allergic rhinitis: Secondary | ICD-10-CM | POA: Diagnosis not present

## 2016-03-16 DIAGNOSIS — J301 Allergic rhinitis due to pollen: Secondary | ICD-10-CM | POA: Diagnosis not present

## 2016-03-22 DIAGNOSIS — J3081 Allergic rhinitis due to animal (cat) (dog) hair and dander: Secondary | ICD-10-CM | POA: Diagnosis not present

## 2016-03-22 DIAGNOSIS — J301 Allergic rhinitis due to pollen: Secondary | ICD-10-CM | POA: Diagnosis not present

## 2016-03-22 DIAGNOSIS — J3089 Other allergic rhinitis: Secondary | ICD-10-CM | POA: Diagnosis not present

## 2016-03-27 DIAGNOSIS — M222X1 Patellofemoral disorders, right knee: Secondary | ICD-10-CM | POA: Diagnosis not present

## 2016-03-29 DIAGNOSIS — J3089 Other allergic rhinitis: Secondary | ICD-10-CM | POA: Diagnosis not present

## 2016-03-29 DIAGNOSIS — J301 Allergic rhinitis due to pollen: Secondary | ICD-10-CM | POA: Diagnosis not present

## 2016-03-29 DIAGNOSIS — J3081 Allergic rhinitis due to animal (cat) (dog) hair and dander: Secondary | ICD-10-CM | POA: Diagnosis not present

## 2016-04-12 DIAGNOSIS — J3081 Allergic rhinitis due to animal (cat) (dog) hair and dander: Secondary | ICD-10-CM | POA: Diagnosis not present

## 2016-04-12 DIAGNOSIS — J301 Allergic rhinitis due to pollen: Secondary | ICD-10-CM | POA: Diagnosis not present

## 2016-04-12 DIAGNOSIS — J3089 Other allergic rhinitis: Secondary | ICD-10-CM | POA: Diagnosis not present

## 2016-04-19 DIAGNOSIS — J3081 Allergic rhinitis due to animal (cat) (dog) hair and dander: Secondary | ICD-10-CM | POA: Diagnosis not present

## 2016-04-19 DIAGNOSIS — J3089 Other allergic rhinitis: Secondary | ICD-10-CM | POA: Diagnosis not present

## 2016-04-19 DIAGNOSIS — J301 Allergic rhinitis due to pollen: Secondary | ICD-10-CM | POA: Diagnosis not present

## 2016-04-26 DIAGNOSIS — J301 Allergic rhinitis due to pollen: Secondary | ICD-10-CM | POA: Diagnosis not present

## 2016-04-26 DIAGNOSIS — J3089 Other allergic rhinitis: Secondary | ICD-10-CM | POA: Diagnosis not present

## 2016-04-26 DIAGNOSIS — J3081 Allergic rhinitis due to animal (cat) (dog) hair and dander: Secondary | ICD-10-CM | POA: Diagnosis not present

## 2016-05-03 DIAGNOSIS — J3089 Other allergic rhinitis: Secondary | ICD-10-CM | POA: Diagnosis not present

## 2016-05-03 DIAGNOSIS — J301 Allergic rhinitis due to pollen: Secondary | ICD-10-CM | POA: Diagnosis not present

## 2016-05-03 DIAGNOSIS — J3081 Allergic rhinitis due to animal (cat) (dog) hair and dander: Secondary | ICD-10-CM | POA: Diagnosis not present

## 2016-05-10 DIAGNOSIS — J301 Allergic rhinitis due to pollen: Secondary | ICD-10-CM | POA: Diagnosis not present

## 2016-05-10 DIAGNOSIS — J3081 Allergic rhinitis due to animal (cat) (dog) hair and dander: Secondary | ICD-10-CM | POA: Diagnosis not present

## 2016-05-10 DIAGNOSIS — J3089 Other allergic rhinitis: Secondary | ICD-10-CM | POA: Diagnosis not present

## 2016-05-15 DIAGNOSIS — J4540 Moderate persistent asthma, uncomplicated: Secondary | ICD-10-CM | POA: Diagnosis not present

## 2016-05-15 DIAGNOSIS — J3089 Other allergic rhinitis: Secondary | ICD-10-CM | POA: Diagnosis not present

## 2016-05-15 DIAGNOSIS — J3081 Allergic rhinitis due to animal (cat) (dog) hair and dander: Secondary | ICD-10-CM | POA: Diagnosis not present

## 2016-05-15 DIAGNOSIS — Z91018 Allergy to other foods: Secondary | ICD-10-CM | POA: Diagnosis not present

## 2016-05-15 DIAGNOSIS — J301 Allergic rhinitis due to pollen: Secondary | ICD-10-CM | POA: Diagnosis not present

## 2016-05-24 DIAGNOSIS — J301 Allergic rhinitis due to pollen: Secondary | ICD-10-CM | POA: Diagnosis not present

## 2016-05-24 DIAGNOSIS — J3081 Allergic rhinitis due to animal (cat) (dog) hair and dander: Secondary | ICD-10-CM | POA: Diagnosis not present

## 2016-05-24 DIAGNOSIS — J3089 Other allergic rhinitis: Secondary | ICD-10-CM | POA: Diagnosis not present

## 2016-05-31 DIAGNOSIS — J3089 Other allergic rhinitis: Secondary | ICD-10-CM | POA: Diagnosis not present

## 2016-05-31 DIAGNOSIS — J301 Allergic rhinitis due to pollen: Secondary | ICD-10-CM | POA: Diagnosis not present

## 2016-05-31 DIAGNOSIS — J3081 Allergic rhinitis due to animal (cat) (dog) hair and dander: Secondary | ICD-10-CM | POA: Diagnosis not present

## 2016-06-07 DIAGNOSIS — J3089 Other allergic rhinitis: Secondary | ICD-10-CM | POA: Diagnosis not present

## 2016-06-07 DIAGNOSIS — J3081 Allergic rhinitis due to animal (cat) (dog) hair and dander: Secondary | ICD-10-CM | POA: Diagnosis not present

## 2016-06-07 DIAGNOSIS — J301 Allergic rhinitis due to pollen: Secondary | ICD-10-CM | POA: Diagnosis not present

## 2016-06-11 DIAGNOSIS — J3089 Other allergic rhinitis: Secondary | ICD-10-CM | POA: Diagnosis not present

## 2016-06-11 DIAGNOSIS — J301 Allergic rhinitis due to pollen: Secondary | ICD-10-CM | POA: Diagnosis not present

## 2016-06-11 DIAGNOSIS — J3081 Allergic rhinitis due to animal (cat) (dog) hair and dander: Secondary | ICD-10-CM | POA: Diagnosis not present

## 2016-06-20 DIAGNOSIS — J3081 Allergic rhinitis due to animal (cat) (dog) hair and dander: Secondary | ICD-10-CM | POA: Diagnosis not present

## 2016-06-20 DIAGNOSIS — J3089 Other allergic rhinitis: Secondary | ICD-10-CM | POA: Diagnosis not present

## 2016-06-20 DIAGNOSIS — J301 Allergic rhinitis due to pollen: Secondary | ICD-10-CM | POA: Diagnosis not present

## 2016-06-26 DIAGNOSIS — R51 Headache: Secondary | ICD-10-CM | POA: Diagnosis not present

## 2016-06-26 DIAGNOSIS — F339 Major depressive disorder, recurrent, unspecified: Secondary | ICD-10-CM | POA: Diagnosis not present

## 2016-06-26 DIAGNOSIS — Z0000 Encounter for general adult medical examination without abnormal findings: Secondary | ICD-10-CM | POA: Diagnosis not present

## 2016-06-27 DIAGNOSIS — J301 Allergic rhinitis due to pollen: Secondary | ICD-10-CM | POA: Diagnosis not present

## 2016-06-27 DIAGNOSIS — J3089 Other allergic rhinitis: Secondary | ICD-10-CM | POA: Diagnosis not present

## 2016-06-27 DIAGNOSIS — K900 Celiac disease: Secondary | ICD-10-CM | POA: Diagnosis not present

## 2016-06-27 DIAGNOSIS — J3081 Allergic rhinitis due to animal (cat) (dog) hair and dander: Secondary | ICD-10-CM | POA: Diagnosis not present

## 2016-07-03 DIAGNOSIS — J3081 Allergic rhinitis due to animal (cat) (dog) hair and dander: Secondary | ICD-10-CM | POA: Diagnosis not present

## 2016-07-03 DIAGNOSIS — J301 Allergic rhinitis due to pollen: Secondary | ICD-10-CM | POA: Diagnosis not present

## 2016-07-03 DIAGNOSIS — J3089 Other allergic rhinitis: Secondary | ICD-10-CM | POA: Diagnosis not present

## 2016-07-10 DIAGNOSIS — J3089 Other allergic rhinitis: Secondary | ICD-10-CM | POA: Diagnosis not present

## 2016-07-10 DIAGNOSIS — J301 Allergic rhinitis due to pollen: Secondary | ICD-10-CM | POA: Diagnosis not present

## 2016-07-10 DIAGNOSIS — J3081 Allergic rhinitis due to animal (cat) (dog) hair and dander: Secondary | ICD-10-CM | POA: Diagnosis not present

## 2016-07-19 DIAGNOSIS — J3089 Other allergic rhinitis: Secondary | ICD-10-CM | POA: Diagnosis not present

## 2016-07-19 DIAGNOSIS — J301 Allergic rhinitis due to pollen: Secondary | ICD-10-CM | POA: Diagnosis not present

## 2016-07-19 DIAGNOSIS — J3081 Allergic rhinitis due to animal (cat) (dog) hair and dander: Secondary | ICD-10-CM | POA: Diagnosis not present

## 2016-07-24 DIAGNOSIS — J3089 Other allergic rhinitis: Secondary | ICD-10-CM | POA: Diagnosis not present

## 2016-07-24 DIAGNOSIS — J301 Allergic rhinitis due to pollen: Secondary | ICD-10-CM | POA: Diagnosis not present

## 2016-07-24 DIAGNOSIS — J3081 Allergic rhinitis due to animal (cat) (dog) hair and dander: Secondary | ICD-10-CM | POA: Diagnosis not present

## 2016-08-01 DIAGNOSIS — J3089 Other allergic rhinitis: Secondary | ICD-10-CM | POA: Diagnosis not present

## 2016-08-01 DIAGNOSIS — J3081 Allergic rhinitis due to animal (cat) (dog) hair and dander: Secondary | ICD-10-CM | POA: Diagnosis not present

## 2016-08-01 DIAGNOSIS — J301 Allergic rhinitis due to pollen: Secondary | ICD-10-CM | POA: Diagnosis not present

## 2016-08-08 DIAGNOSIS — J3081 Allergic rhinitis due to animal (cat) (dog) hair and dander: Secondary | ICD-10-CM | POA: Diagnosis not present

## 2016-08-08 DIAGNOSIS — J301 Allergic rhinitis due to pollen: Secondary | ICD-10-CM | POA: Diagnosis not present

## 2016-08-08 DIAGNOSIS — J3089 Other allergic rhinitis: Secondary | ICD-10-CM | POA: Diagnosis not present

## 2016-08-16 DIAGNOSIS — J3089 Other allergic rhinitis: Secondary | ICD-10-CM | POA: Diagnosis not present

## 2016-08-16 DIAGNOSIS — J301 Allergic rhinitis due to pollen: Secondary | ICD-10-CM | POA: Diagnosis not present

## 2016-08-16 DIAGNOSIS — J3081 Allergic rhinitis due to animal (cat) (dog) hair and dander: Secondary | ICD-10-CM | POA: Diagnosis not present

## 2016-08-23 DIAGNOSIS — J301 Allergic rhinitis due to pollen: Secondary | ICD-10-CM | POA: Diagnosis not present

## 2016-08-23 DIAGNOSIS — J3089 Other allergic rhinitis: Secondary | ICD-10-CM | POA: Diagnosis not present

## 2016-08-23 DIAGNOSIS — J3081 Allergic rhinitis due to animal (cat) (dog) hair and dander: Secondary | ICD-10-CM | POA: Diagnosis not present

## 2016-08-29 DIAGNOSIS — J3081 Allergic rhinitis due to animal (cat) (dog) hair and dander: Secondary | ICD-10-CM | POA: Diagnosis not present

## 2016-08-29 DIAGNOSIS — J3089 Other allergic rhinitis: Secondary | ICD-10-CM | POA: Diagnosis not present

## 2016-08-29 DIAGNOSIS — J301 Allergic rhinitis due to pollen: Secondary | ICD-10-CM | POA: Diagnosis not present

## 2016-08-31 DIAGNOSIS — J3089 Other allergic rhinitis: Secondary | ICD-10-CM | POA: Diagnosis not present

## 2016-08-31 DIAGNOSIS — J4540 Moderate persistent asthma, uncomplicated: Secondary | ICD-10-CM | POA: Diagnosis not present

## 2016-08-31 DIAGNOSIS — J301 Allergic rhinitis due to pollen: Secondary | ICD-10-CM | POA: Diagnosis not present

## 2016-08-31 DIAGNOSIS — J3081 Allergic rhinitis due to animal (cat) (dog) hair and dander: Secondary | ICD-10-CM | POA: Diagnosis not present

## 2016-09-20 DIAGNOSIS — J3089 Other allergic rhinitis: Secondary | ICD-10-CM | POA: Diagnosis not present

## 2016-09-20 DIAGNOSIS — J301 Allergic rhinitis due to pollen: Secondary | ICD-10-CM | POA: Diagnosis not present

## 2016-09-20 DIAGNOSIS — J3081 Allergic rhinitis due to animal (cat) (dog) hair and dander: Secondary | ICD-10-CM | POA: Diagnosis not present

## 2016-09-27 DIAGNOSIS — J3089 Other allergic rhinitis: Secondary | ICD-10-CM | POA: Diagnosis not present

## 2016-09-27 DIAGNOSIS — J301 Allergic rhinitis due to pollen: Secondary | ICD-10-CM | POA: Diagnosis not present

## 2016-09-27 DIAGNOSIS — J3081 Allergic rhinitis due to animal (cat) (dog) hair and dander: Secondary | ICD-10-CM | POA: Diagnosis not present

## 2016-10-09 DIAGNOSIS — J3089 Other allergic rhinitis: Secondary | ICD-10-CM | POA: Diagnosis not present

## 2016-10-09 DIAGNOSIS — J301 Allergic rhinitis due to pollen: Secondary | ICD-10-CM | POA: Diagnosis not present

## 2016-10-09 DIAGNOSIS — J3081 Allergic rhinitis due to animal (cat) (dog) hair and dander: Secondary | ICD-10-CM | POA: Diagnosis not present

## 2016-10-09 DIAGNOSIS — J4540 Moderate persistent asthma, uncomplicated: Secondary | ICD-10-CM | POA: Diagnosis not present

## 2016-10-16 DIAGNOSIS — J301 Allergic rhinitis due to pollen: Secondary | ICD-10-CM | POA: Diagnosis not present

## 2016-10-16 DIAGNOSIS — J3089 Other allergic rhinitis: Secondary | ICD-10-CM | POA: Diagnosis not present

## 2016-10-16 DIAGNOSIS — J3081 Allergic rhinitis due to animal (cat) (dog) hair and dander: Secondary | ICD-10-CM | POA: Diagnosis not present

## 2016-10-24 DIAGNOSIS — J3089 Other allergic rhinitis: Secondary | ICD-10-CM | POA: Diagnosis not present

## 2016-10-24 DIAGNOSIS — J301 Allergic rhinitis due to pollen: Secondary | ICD-10-CM | POA: Diagnosis not present

## 2016-10-24 DIAGNOSIS — J3081 Allergic rhinitis due to animal (cat) (dog) hair and dander: Secondary | ICD-10-CM | POA: Diagnosis not present

## 2016-11-01 DIAGNOSIS — J3081 Allergic rhinitis due to animal (cat) (dog) hair and dander: Secondary | ICD-10-CM | POA: Diagnosis not present

## 2016-11-01 DIAGNOSIS — J3089 Other allergic rhinitis: Secondary | ICD-10-CM | POA: Diagnosis not present

## 2016-11-01 DIAGNOSIS — J301 Allergic rhinitis due to pollen: Secondary | ICD-10-CM | POA: Diagnosis not present

## 2016-11-07 DIAGNOSIS — J301 Allergic rhinitis due to pollen: Secondary | ICD-10-CM | POA: Diagnosis not present

## 2016-11-07 DIAGNOSIS — J3081 Allergic rhinitis due to animal (cat) (dog) hair and dander: Secondary | ICD-10-CM | POA: Diagnosis not present

## 2016-11-07 DIAGNOSIS — J3089 Other allergic rhinitis: Secondary | ICD-10-CM | POA: Diagnosis not present

## 2016-11-13 DIAGNOSIS — J3089 Other allergic rhinitis: Secondary | ICD-10-CM | POA: Diagnosis not present

## 2016-11-13 DIAGNOSIS — J3081 Allergic rhinitis due to animal (cat) (dog) hair and dander: Secondary | ICD-10-CM | POA: Diagnosis not present

## 2016-11-13 DIAGNOSIS — J301 Allergic rhinitis due to pollen: Secondary | ICD-10-CM | POA: Diagnosis not present

## 2016-11-21 DIAGNOSIS — J3089 Other allergic rhinitis: Secondary | ICD-10-CM | POA: Diagnosis not present

## 2016-11-21 DIAGNOSIS — J301 Allergic rhinitis due to pollen: Secondary | ICD-10-CM | POA: Diagnosis not present

## 2016-11-21 DIAGNOSIS — J3081 Allergic rhinitis due to animal (cat) (dog) hair and dander: Secondary | ICD-10-CM | POA: Diagnosis not present

## 2016-11-28 DIAGNOSIS — J3089 Other allergic rhinitis: Secondary | ICD-10-CM | POA: Diagnosis not present

## 2016-11-28 DIAGNOSIS — J3081 Allergic rhinitis due to animal (cat) (dog) hair and dander: Secondary | ICD-10-CM | POA: Diagnosis not present

## 2016-11-28 DIAGNOSIS — J301 Allergic rhinitis due to pollen: Secondary | ICD-10-CM | POA: Diagnosis not present

## 2016-12-06 DIAGNOSIS — J3081 Allergic rhinitis due to animal (cat) (dog) hair and dander: Secondary | ICD-10-CM | POA: Diagnosis not present

## 2016-12-06 DIAGNOSIS — J3089 Other allergic rhinitis: Secondary | ICD-10-CM | POA: Diagnosis not present

## 2016-12-06 DIAGNOSIS — J301 Allergic rhinitis due to pollen: Secondary | ICD-10-CM | POA: Diagnosis not present

## 2016-12-11 DIAGNOSIS — J3089 Other allergic rhinitis: Secondary | ICD-10-CM | POA: Diagnosis not present

## 2016-12-11 DIAGNOSIS — J3081 Allergic rhinitis due to animal (cat) (dog) hair and dander: Secondary | ICD-10-CM | POA: Diagnosis not present

## 2016-12-11 DIAGNOSIS — J301 Allergic rhinitis due to pollen: Secondary | ICD-10-CM | POA: Diagnosis not present

## 2016-12-24 DIAGNOSIS — Z23 Encounter for immunization: Secondary | ICD-10-CM | POA: Diagnosis not present

## 2016-12-26 DIAGNOSIS — J3089 Other allergic rhinitis: Secondary | ICD-10-CM | POA: Diagnosis not present

## 2016-12-26 DIAGNOSIS — J301 Allergic rhinitis due to pollen: Secondary | ICD-10-CM | POA: Diagnosis not present

## 2016-12-26 DIAGNOSIS — J3081 Allergic rhinitis due to animal (cat) (dog) hair and dander: Secondary | ICD-10-CM | POA: Diagnosis not present

## 2017-01-02 DIAGNOSIS — J3081 Allergic rhinitis due to animal (cat) (dog) hair and dander: Secondary | ICD-10-CM | POA: Diagnosis not present

## 2017-01-02 DIAGNOSIS — J301 Allergic rhinitis due to pollen: Secondary | ICD-10-CM | POA: Diagnosis not present

## 2017-01-02 DIAGNOSIS — J3089 Other allergic rhinitis: Secondary | ICD-10-CM | POA: Diagnosis not present

## 2017-01-10 DIAGNOSIS — J3081 Allergic rhinitis due to animal (cat) (dog) hair and dander: Secondary | ICD-10-CM | POA: Diagnosis not present

## 2017-01-10 DIAGNOSIS — J3089 Other allergic rhinitis: Secondary | ICD-10-CM | POA: Diagnosis not present

## 2017-01-10 DIAGNOSIS — J301 Allergic rhinitis due to pollen: Secondary | ICD-10-CM | POA: Diagnosis not present

## 2017-01-16 DIAGNOSIS — J301 Allergic rhinitis due to pollen: Secondary | ICD-10-CM | POA: Diagnosis not present

## 2017-01-16 DIAGNOSIS — J3089 Other allergic rhinitis: Secondary | ICD-10-CM | POA: Diagnosis not present

## 2017-01-16 DIAGNOSIS — J3081 Allergic rhinitis due to animal (cat) (dog) hair and dander: Secondary | ICD-10-CM | POA: Diagnosis not present

## 2017-01-24 DIAGNOSIS — J3081 Allergic rhinitis due to animal (cat) (dog) hair and dander: Secondary | ICD-10-CM | POA: Diagnosis not present

## 2017-01-24 DIAGNOSIS — J3089 Other allergic rhinitis: Secondary | ICD-10-CM | POA: Diagnosis not present

## 2017-01-24 DIAGNOSIS — J301 Allergic rhinitis due to pollen: Secondary | ICD-10-CM | POA: Diagnosis not present

## 2017-01-25 DIAGNOSIS — J3089 Other allergic rhinitis: Secondary | ICD-10-CM | POA: Diagnosis not present

## 2017-01-29 DIAGNOSIS — R05 Cough: Secondary | ICD-10-CM | POA: Diagnosis not present

## 2017-01-29 DIAGNOSIS — J069 Acute upper respiratory infection, unspecified: Secondary | ICD-10-CM | POA: Diagnosis not present

## 2017-02-03 DIAGNOSIS — J069 Acute upper respiratory infection, unspecified: Secondary | ICD-10-CM | POA: Diagnosis not present

## 2017-02-03 DIAGNOSIS — R05 Cough: Secondary | ICD-10-CM | POA: Diagnosis not present

## 2017-02-13 DIAGNOSIS — J3081 Allergic rhinitis due to animal (cat) (dog) hair and dander: Secondary | ICD-10-CM | POA: Diagnosis not present

## 2017-02-13 DIAGNOSIS — J301 Allergic rhinitis due to pollen: Secondary | ICD-10-CM | POA: Diagnosis not present

## 2017-02-13 DIAGNOSIS — J3089 Other allergic rhinitis: Secondary | ICD-10-CM | POA: Diagnosis not present

## 2017-02-20 DIAGNOSIS — J3089 Other allergic rhinitis: Secondary | ICD-10-CM | POA: Diagnosis not present

## 2017-02-20 DIAGNOSIS — J301 Allergic rhinitis due to pollen: Secondary | ICD-10-CM | POA: Diagnosis not present

## 2017-02-20 DIAGNOSIS — J3081 Allergic rhinitis due to animal (cat) (dog) hair and dander: Secondary | ICD-10-CM | POA: Diagnosis not present

## 2017-02-27 DIAGNOSIS — J301 Allergic rhinitis due to pollen: Secondary | ICD-10-CM | POA: Diagnosis not present

## 2017-02-27 DIAGNOSIS — J3089 Other allergic rhinitis: Secondary | ICD-10-CM | POA: Diagnosis not present

## 2017-02-27 DIAGNOSIS — J3081 Allergic rhinitis due to animal (cat) (dog) hair and dander: Secondary | ICD-10-CM | POA: Diagnosis not present

## 2017-03-07 DIAGNOSIS — J3089 Other allergic rhinitis: Secondary | ICD-10-CM | POA: Diagnosis not present

## 2017-03-07 DIAGNOSIS — J3081 Allergic rhinitis due to animal (cat) (dog) hair and dander: Secondary | ICD-10-CM | POA: Diagnosis not present

## 2017-03-07 DIAGNOSIS — J301 Allergic rhinitis due to pollen: Secondary | ICD-10-CM | POA: Diagnosis not present

## 2017-03-15 DIAGNOSIS — J3081 Allergic rhinitis due to animal (cat) (dog) hair and dander: Secondary | ICD-10-CM | POA: Diagnosis not present

## 2017-03-15 DIAGNOSIS — J301 Allergic rhinitis due to pollen: Secondary | ICD-10-CM | POA: Diagnosis not present

## 2017-03-15 DIAGNOSIS — J3089 Other allergic rhinitis: Secondary | ICD-10-CM | POA: Diagnosis not present

## 2017-03-21 DIAGNOSIS — J301 Allergic rhinitis due to pollen: Secondary | ICD-10-CM | POA: Diagnosis not present

## 2017-03-21 DIAGNOSIS — J3089 Other allergic rhinitis: Secondary | ICD-10-CM | POA: Diagnosis not present

## 2017-03-21 DIAGNOSIS — J3081 Allergic rhinitis due to animal (cat) (dog) hair and dander: Secondary | ICD-10-CM | POA: Diagnosis not present

## 2017-03-26 DIAGNOSIS — J3081 Allergic rhinitis due to animal (cat) (dog) hair and dander: Secondary | ICD-10-CM | POA: Diagnosis not present

## 2017-03-26 DIAGNOSIS — J301 Allergic rhinitis due to pollen: Secondary | ICD-10-CM | POA: Diagnosis not present

## 2017-03-26 DIAGNOSIS — J3089 Other allergic rhinitis: Secondary | ICD-10-CM | POA: Diagnosis not present

## 2017-04-03 DIAGNOSIS — J3081 Allergic rhinitis due to animal (cat) (dog) hair and dander: Secondary | ICD-10-CM | POA: Diagnosis not present

## 2017-04-03 DIAGNOSIS — J301 Allergic rhinitis due to pollen: Secondary | ICD-10-CM | POA: Diagnosis not present

## 2017-04-03 DIAGNOSIS — J3089 Other allergic rhinitis: Secondary | ICD-10-CM | POA: Diagnosis not present

## 2017-04-10 DIAGNOSIS — J3089 Other allergic rhinitis: Secondary | ICD-10-CM | POA: Diagnosis not present

## 2017-04-10 DIAGNOSIS — J301 Allergic rhinitis due to pollen: Secondary | ICD-10-CM | POA: Diagnosis not present

## 2017-04-10 DIAGNOSIS — J3081 Allergic rhinitis due to animal (cat) (dog) hair and dander: Secondary | ICD-10-CM | POA: Diagnosis not present

## 2017-04-11 DIAGNOSIS — J3081 Allergic rhinitis due to animal (cat) (dog) hair and dander: Secondary | ICD-10-CM | POA: Diagnosis not present

## 2017-04-11 DIAGNOSIS — J301 Allergic rhinitis due to pollen: Secondary | ICD-10-CM | POA: Diagnosis not present

## 2017-04-15 DIAGNOSIS — J3081 Allergic rhinitis due to animal (cat) (dog) hair and dander: Secondary | ICD-10-CM | POA: Diagnosis not present

## 2017-04-15 DIAGNOSIS — J301 Allergic rhinitis due to pollen: Secondary | ICD-10-CM | POA: Diagnosis not present

## 2017-04-15 DIAGNOSIS — J3089 Other allergic rhinitis: Secondary | ICD-10-CM | POA: Diagnosis not present

## 2017-04-24 DIAGNOSIS — J3081 Allergic rhinitis due to animal (cat) (dog) hair and dander: Secondary | ICD-10-CM | POA: Diagnosis not present

## 2017-04-24 DIAGNOSIS — J3089 Other allergic rhinitis: Secondary | ICD-10-CM | POA: Diagnosis not present

## 2017-04-24 DIAGNOSIS — J301 Allergic rhinitis due to pollen: Secondary | ICD-10-CM | POA: Diagnosis not present

## 2017-05-01 DIAGNOSIS — J3089 Other allergic rhinitis: Secondary | ICD-10-CM | POA: Diagnosis not present

## 2017-05-01 DIAGNOSIS — J3081 Allergic rhinitis due to animal (cat) (dog) hair and dander: Secondary | ICD-10-CM | POA: Diagnosis not present

## 2017-05-01 DIAGNOSIS — J301 Allergic rhinitis due to pollen: Secondary | ICD-10-CM | POA: Diagnosis not present

## 2017-05-15 DIAGNOSIS — J3081 Allergic rhinitis due to animal (cat) (dog) hair and dander: Secondary | ICD-10-CM | POA: Diagnosis not present

## 2017-05-15 DIAGNOSIS — J301 Allergic rhinitis due to pollen: Secondary | ICD-10-CM | POA: Diagnosis not present

## 2017-05-15 DIAGNOSIS — J3089 Other allergic rhinitis: Secondary | ICD-10-CM | POA: Diagnosis not present

## 2017-05-22 DIAGNOSIS — J3089 Other allergic rhinitis: Secondary | ICD-10-CM | POA: Diagnosis not present

## 2017-05-22 DIAGNOSIS — J301 Allergic rhinitis due to pollen: Secondary | ICD-10-CM | POA: Diagnosis not present

## 2017-05-22 DIAGNOSIS — J3081 Allergic rhinitis due to animal (cat) (dog) hair and dander: Secondary | ICD-10-CM | POA: Diagnosis not present

## 2017-06-05 DIAGNOSIS — J3081 Allergic rhinitis due to animal (cat) (dog) hair and dander: Secondary | ICD-10-CM | POA: Diagnosis not present

## 2017-06-05 DIAGNOSIS — J301 Allergic rhinitis due to pollen: Secondary | ICD-10-CM | POA: Diagnosis not present

## 2017-06-05 DIAGNOSIS — J3089 Other allergic rhinitis: Secondary | ICD-10-CM | POA: Diagnosis not present

## 2017-06-12 DIAGNOSIS — J3089 Other allergic rhinitis: Secondary | ICD-10-CM | POA: Diagnosis not present

## 2017-06-12 DIAGNOSIS — J301 Allergic rhinitis due to pollen: Secondary | ICD-10-CM | POA: Diagnosis not present

## 2017-06-12 DIAGNOSIS — J3081 Allergic rhinitis due to animal (cat) (dog) hair and dander: Secondary | ICD-10-CM | POA: Diagnosis not present

## 2017-06-24 DIAGNOSIS — J4540 Moderate persistent asthma, uncomplicated: Secondary | ICD-10-CM | POA: Diagnosis not present

## 2017-06-24 DIAGNOSIS — J3081 Allergic rhinitis due to animal (cat) (dog) hair and dander: Secondary | ICD-10-CM | POA: Diagnosis not present

## 2017-06-24 DIAGNOSIS — J301 Allergic rhinitis due to pollen: Secondary | ICD-10-CM | POA: Diagnosis not present

## 2017-06-24 DIAGNOSIS — J019 Acute sinusitis, unspecified: Secondary | ICD-10-CM | POA: Diagnosis not present

## 2017-06-27 DIAGNOSIS — F339 Major depressive disorder, recurrent, unspecified: Secondary | ICD-10-CM | POA: Diagnosis not present

## 2017-06-27 DIAGNOSIS — Z13220 Encounter for screening for lipoid disorders: Secondary | ICD-10-CM | POA: Diagnosis not present

## 2017-06-27 DIAGNOSIS — R5383 Other fatigue: Secondary | ICD-10-CM | POA: Diagnosis not present

## 2017-06-27 DIAGNOSIS — Z0000 Encounter for general adult medical examination without abnormal findings: Secondary | ICD-10-CM | POA: Diagnosis not present

## 2017-06-27 DIAGNOSIS — R51 Headache: Secondary | ICD-10-CM | POA: Diagnosis not present

## 2017-07-17 DIAGNOSIS — R070 Pain in throat: Secondary | ICD-10-CM | POA: Diagnosis not present

## 2017-07-31 DIAGNOSIS — J3089 Other allergic rhinitis: Secondary | ICD-10-CM | POA: Diagnosis not present

## 2017-07-31 DIAGNOSIS — J3081 Allergic rhinitis due to animal (cat) (dog) hair and dander: Secondary | ICD-10-CM | POA: Diagnosis not present

## 2017-07-31 DIAGNOSIS — J301 Allergic rhinitis due to pollen: Secondary | ICD-10-CM | POA: Diagnosis not present

## 2017-08-06 DIAGNOSIS — K900 Celiac disease: Secondary | ICD-10-CM | POA: Diagnosis not present

## 2017-08-07 DIAGNOSIS — J301 Allergic rhinitis due to pollen: Secondary | ICD-10-CM | POA: Diagnosis not present

## 2017-08-07 DIAGNOSIS — J3081 Allergic rhinitis due to animal (cat) (dog) hair and dander: Secondary | ICD-10-CM | POA: Diagnosis not present

## 2017-08-07 DIAGNOSIS — J3089 Other allergic rhinitis: Secondary | ICD-10-CM | POA: Diagnosis not present

## 2017-08-14 DIAGNOSIS — J301 Allergic rhinitis due to pollen: Secondary | ICD-10-CM | POA: Diagnosis not present

## 2017-08-14 DIAGNOSIS — J3089 Other allergic rhinitis: Secondary | ICD-10-CM | POA: Diagnosis not present

## 2017-08-14 DIAGNOSIS — J3081 Allergic rhinitis due to animal (cat) (dog) hair and dander: Secondary | ICD-10-CM | POA: Diagnosis not present

## 2017-08-21 DIAGNOSIS — J3081 Allergic rhinitis due to animal (cat) (dog) hair and dander: Secondary | ICD-10-CM | POA: Diagnosis not present

## 2017-08-21 DIAGNOSIS — J301 Allergic rhinitis due to pollen: Secondary | ICD-10-CM | POA: Diagnosis not present

## 2017-08-21 DIAGNOSIS — J3089 Other allergic rhinitis: Secondary | ICD-10-CM | POA: Diagnosis not present

## 2017-08-22 DIAGNOSIS — G4719 Other hypersomnia: Secondary | ICD-10-CM | POA: Diagnosis not present

## 2017-08-27 ENCOUNTER — Encounter: Payer: Self-pay | Admitting: Skilled Nursing Facility1

## 2017-08-27 ENCOUNTER — Encounter: Payer: BLUE CROSS/BLUE SHIELD | Attending: Gastroenterology | Admitting: Skilled Nursing Facility1

## 2017-08-27 DIAGNOSIS — Z713 Dietary counseling and surveillance: Secondary | ICD-10-CM | POA: Insufficient documentation

## 2017-08-27 DIAGNOSIS — K900 Celiac disease: Secondary | ICD-10-CM | POA: Diagnosis not present

## 2017-08-27 NOTE — Progress Notes (Signed)
  Assessment:  Primary concerns today: celiac.   Allergies: Peanuts, pine nuts, squash, melon, bell peppers, radishes, soy bean, lobster, crab, red meats, garlic, sesame seeds, feta cheese, blue cheese, gorgonzola, goat cheese, wheat, rye, barley, oats.  Pt states her bowel movements are typically good and no nausea and vomiting. Pt states she has been feeling fatigue for many years. Pt states she sleeps about 10 pm-6:45am and then a few more after a while. Pt states she lives with her parents who are not gluten free. Pt states she is very frustrated with all of her allergies and celiac making everything hard.   Allergic reactions: hives, itching, vomiting  MEDICATIONS: See List   DIETARY INTAKE:  Usual eating pattern includes 3 meals and 0 snacks per day.  Everyday foods include none stated.  Avoided foods include alleries.    24-hr recall:  B ( AM): greek yogurt and banana or crispex with coffee with milk and sugar Snk ( AM):  L ( PM): 2 slices of cheese and gluten free roll cup of carrots and nuts with almonds and cashews and applesauce  Snk ( PM): D ( PM): pasta with carrots and applesauce with cheese or yogurt or chicken and rice or corn or salad with ham and eggs  Snk ( PM): ice cream or gluten free cake  Beverages: water  Usual physical activity: 2 days a week purre bar   Estimated energy needs: 160 calories 180 g carbohydrates 120 g protein 44 g fat  Progress Towards Goal(s):  In progress.    Intervention:  Nutrition counseling for food allergies. Goals: -Incorporate more snacks with fresh fruit -Eat more variety of proteins such as chicken and fish -You have done a fantastic job with your diet! -Try yoga  Teaching Method Utilized:  Visual Auditory Hands on  Barriers to learning/adherence to lifestyle change: none identified   Demonstrated degree of understanding via:  Teach Back   Monitoring/Evaluation:  Dietary intake, exercise, and body weight prn.

## 2017-08-28 DIAGNOSIS — J3089 Other allergic rhinitis: Secondary | ICD-10-CM | POA: Diagnosis not present

## 2017-08-28 DIAGNOSIS — J301 Allergic rhinitis due to pollen: Secondary | ICD-10-CM | POA: Diagnosis not present

## 2017-08-28 DIAGNOSIS — J3081 Allergic rhinitis due to animal (cat) (dog) hair and dander: Secondary | ICD-10-CM | POA: Diagnosis not present

## 2017-08-29 ENCOUNTER — Other Ambulatory Visit (HOSPITAL_BASED_OUTPATIENT_CLINIC_OR_DEPARTMENT_OTHER): Payer: Self-pay

## 2017-08-29 DIAGNOSIS — G4710 Hypersomnia, unspecified: Secondary | ICD-10-CM

## 2017-08-29 DIAGNOSIS — G478 Other sleep disorders: Secondary | ICD-10-CM

## 2017-08-29 DIAGNOSIS — R5383 Other fatigue: Secondary | ICD-10-CM

## 2017-08-29 DIAGNOSIS — G4733 Obstructive sleep apnea (adult) (pediatric): Secondary | ICD-10-CM

## 2017-08-29 DIAGNOSIS — J3089 Other allergic rhinitis: Secondary | ICD-10-CM | POA: Diagnosis not present

## 2017-09-05 DIAGNOSIS — J3089 Other allergic rhinitis: Secondary | ICD-10-CM | POA: Diagnosis not present

## 2017-09-05 DIAGNOSIS — J3081 Allergic rhinitis due to animal (cat) (dog) hair and dander: Secondary | ICD-10-CM | POA: Diagnosis not present

## 2017-09-05 DIAGNOSIS — J301 Allergic rhinitis due to pollen: Secondary | ICD-10-CM | POA: Diagnosis not present

## 2017-09-13 DIAGNOSIS — J3081 Allergic rhinitis due to animal (cat) (dog) hair and dander: Secondary | ICD-10-CM | POA: Diagnosis not present

## 2017-09-13 DIAGNOSIS — J3089 Other allergic rhinitis: Secondary | ICD-10-CM | POA: Diagnosis not present

## 2017-09-13 DIAGNOSIS — J301 Allergic rhinitis due to pollen: Secondary | ICD-10-CM | POA: Diagnosis not present

## 2017-09-17 DIAGNOSIS — J3089 Other allergic rhinitis: Secondary | ICD-10-CM | POA: Diagnosis not present

## 2017-09-17 DIAGNOSIS — J3081 Allergic rhinitis due to animal (cat) (dog) hair and dander: Secondary | ICD-10-CM | POA: Diagnosis not present

## 2017-09-17 DIAGNOSIS — J301 Allergic rhinitis due to pollen: Secondary | ICD-10-CM | POA: Diagnosis not present

## 2017-09-25 DIAGNOSIS — J3081 Allergic rhinitis due to animal (cat) (dog) hair and dander: Secondary | ICD-10-CM | POA: Diagnosis not present

## 2017-09-25 DIAGNOSIS — J3089 Other allergic rhinitis: Secondary | ICD-10-CM | POA: Diagnosis not present

## 2017-09-25 DIAGNOSIS — J301 Allergic rhinitis due to pollen: Secondary | ICD-10-CM | POA: Diagnosis not present

## 2017-10-04 DIAGNOSIS — J3081 Allergic rhinitis due to animal (cat) (dog) hair and dander: Secondary | ICD-10-CM | POA: Diagnosis not present

## 2017-10-04 DIAGNOSIS — J3089 Other allergic rhinitis: Secondary | ICD-10-CM | POA: Diagnosis not present

## 2017-10-04 DIAGNOSIS — J301 Allergic rhinitis due to pollen: Secondary | ICD-10-CM | POA: Diagnosis not present

## 2017-10-08 ENCOUNTER — Encounter (HOSPITAL_BASED_OUTPATIENT_CLINIC_OR_DEPARTMENT_OTHER): Payer: Self-pay

## 2017-10-09 ENCOUNTER — Encounter (HOSPITAL_BASED_OUTPATIENT_CLINIC_OR_DEPARTMENT_OTHER): Payer: Self-pay

## 2017-10-09 DIAGNOSIS — J3081 Allergic rhinitis due to animal (cat) (dog) hair and dander: Secondary | ICD-10-CM | POA: Diagnosis not present

## 2017-10-09 DIAGNOSIS — J3089 Other allergic rhinitis: Secondary | ICD-10-CM | POA: Diagnosis not present

## 2017-10-09 DIAGNOSIS — J301 Allergic rhinitis due to pollen: Secondary | ICD-10-CM | POA: Diagnosis not present

## 2017-10-16 DIAGNOSIS — J301 Allergic rhinitis due to pollen: Secondary | ICD-10-CM | POA: Diagnosis not present

## 2017-10-16 DIAGNOSIS — J3089 Other allergic rhinitis: Secondary | ICD-10-CM | POA: Diagnosis not present

## 2017-10-16 DIAGNOSIS — J3081 Allergic rhinitis due to animal (cat) (dog) hair and dander: Secondary | ICD-10-CM | POA: Diagnosis not present

## 2017-10-17 DIAGNOSIS — G4711 Idiopathic hypersomnia with long sleep time: Secondary | ICD-10-CM | POA: Insufficient documentation

## 2017-10-20 ENCOUNTER — Ambulatory Visit (HOSPITAL_BASED_OUTPATIENT_CLINIC_OR_DEPARTMENT_OTHER): Payer: BLUE CROSS/BLUE SHIELD | Attending: Internal Medicine | Admitting: Internal Medicine

## 2017-10-20 VITALS — Ht 62.0 in | Wt 127.0 lb

## 2017-10-20 DIAGNOSIS — G4710 Hypersomnia, unspecified: Secondary | ICD-10-CM

## 2017-10-20 DIAGNOSIS — G4711 Idiopathic hypersomnia with long sleep time: Secondary | ICD-10-CM | POA: Insufficient documentation

## 2017-10-20 DIAGNOSIS — R5383 Other fatigue: Secondary | ICD-10-CM

## 2017-10-21 ENCOUNTER — Ambulatory Visit (HOSPITAL_BASED_OUTPATIENT_CLINIC_OR_DEPARTMENT_OTHER): Payer: BLUE CROSS/BLUE SHIELD | Attending: Internal Medicine | Admitting: Internal Medicine

## 2017-10-21 DIAGNOSIS — G4710 Hypersomnia, unspecified: Secondary | ICD-10-CM | POA: Diagnosis not present

## 2017-10-21 DIAGNOSIS — G478 Other sleep disorders: Secondary | ICD-10-CM

## 2017-10-21 DIAGNOSIS — R5383 Other fatigue: Secondary | ICD-10-CM

## 2017-10-23 DIAGNOSIS — J3081 Allergic rhinitis due to animal (cat) (dog) hair and dander: Secondary | ICD-10-CM | POA: Diagnosis not present

## 2017-10-23 DIAGNOSIS — J301 Allergic rhinitis due to pollen: Secondary | ICD-10-CM | POA: Diagnosis not present

## 2017-10-23 DIAGNOSIS — J3089 Other allergic rhinitis: Secondary | ICD-10-CM | POA: Diagnosis not present

## 2017-10-30 DIAGNOSIS — J3081 Allergic rhinitis due to animal (cat) (dog) hair and dander: Secondary | ICD-10-CM | POA: Diagnosis not present

## 2017-10-30 DIAGNOSIS — J301 Allergic rhinitis due to pollen: Secondary | ICD-10-CM | POA: Diagnosis not present

## 2017-10-30 DIAGNOSIS — J3089 Other allergic rhinitis: Secondary | ICD-10-CM | POA: Diagnosis not present

## 2017-11-03 NOTE — Procedures (Signed)
    NAME: Pamela DawsonRachel A Diluzio DATE OF BIRTH:  [birth year] MEDICAL RECORD NUMBER [id number]  LOCATION: [facility]  PHYSICIAN: Deretha EmoryJames C Braylyn Kalter  DATE OF STUDY: 10/21/2017  SLEEP STUDY TYPE: Multiple Sleep Latency Test               REFERRING PHYSICIAN: Deretha Emorysborne, Jadeyn Hargett C, MD  INDICATION FOR STUDY: Excessive daytime sleepiness with normal sleep times and negative PSG  EPWORTH SLEEPINESS SCORE:   HEIGHT: 5\' 2"  (157.5 cm)  WEIGHT: 127 lb (57.6 kg)    Body mass index is 23.23 kg/m.  NECK SIZE: 12.5 in.  MEDICATIONS  Patient self administered medications include: XYZAL, MONTELUKAST, SYMBICORT. Medications administered during study include No sleep medicine administered.Marland Kitchen.   SLEEP STUDY TECHNIQUE  A multiple sleep latency test was performed. The channels recorded and monitored were central and occipital EEG, electrooculogram (EOG), submentalis EMG (chin), and electrocardiogram.   TECHNICAL COMMENTS  Comments added by Technician: NONE Comments added by Scorer: N/A   IMPRESSIONS  No sleep onset REMs present. This study does not suggest narcolepsy. Total number of naps attempted: 5 . Total number of naps with sleep attained: 5 Pathologic sleepiness is suggested by short mean sleep latency of 6:25 minutes  DIAGNOSIS  Idiopathic hypersomnia (327.11 [G47.11 ICD-10])  RECOMMENDATIONS  Return for follow up and management of Idiopathic Hypersomnia.   Deretha EmoryJames C Dicie Edelen Sleep specialist, American Board of Internal Medicine  ELECTRONICALLY SIGNED ON:  11/03/2017, 8:26 PM [facility] PH: [phone]   FX: [phone] ACCREDITED BY THE AMERICAN ACADEMY OF SLEEP MEDICINE

## 2017-11-05 DIAGNOSIS — J3081 Allergic rhinitis due to animal (cat) (dog) hair and dander: Secondary | ICD-10-CM | POA: Diagnosis not present

## 2017-11-05 DIAGNOSIS — J301 Allergic rhinitis due to pollen: Secondary | ICD-10-CM | POA: Diagnosis not present

## 2017-11-05 DIAGNOSIS — J3089 Other allergic rhinitis: Secondary | ICD-10-CM | POA: Diagnosis not present

## 2017-11-10 NOTE — Procedures (Signed)
   NAME: Pamela DawsonRachel A Neth DATE OF BIRTH:  [birth year] MEDICAL RECORD NUMBER [id number]  LOCATION: [facility]  PHYSICIAN: Deretha EmoryJames C Jeshua Ransford  DATE OF STUDY: 10/20/2017  SLEEP STUDY TYPE: Nocturnal Polysomnogram               REFERRING PHYSICIAN: Deretha Emorysborne, Yadhira Mckneely C, MD  INDICATION FOR STUDY: excessive daytime sleepiness  EPWORTH SLEEPINESS SCORE:  11 HEIGHT: 5\' 2"  (157.5 cm)  WEIGHT: 127 lb (57.6 kg)    Body mass index is 23.23 kg/m.  NECK SIZE: 12.5 in.  MEDICATIONS  Patient self administered medications include: XYZAL, MONTELUKAST, SYMBICORT. Medications administered during study include No sleep medicine administered.Marland Kitchen.   SLEEP STUDY TECHNIQUE  A multi-channel overnight Polysomnography study was performed. The channels recorded and monitored were central and occipital EEG, electrooculogram (EOG), submentalis EMG (chin), nasal and oral airflow, thoracic and abdominal wall motion, anterior tibialis EMG, snore microphone, electrocardiogram, and a pulse oximetry.   TECHNICAL COMMENTS  Comments added by Technician: NO RESTROOM VISTED. Patient had difficulty initiating sleep. Patient was restless all through the night. Comments added by Scorer: N/A   SLEEP ARCHITECTURE  The study was initiated at 10:50:46 PM and terminated at 6:00:57 AM. The total recorded time was 430.2 minutes. EEG confirmed total sleep time was 402 minutes yielding a sleep efficiency of 93.4%%. Sleep onset after lights out was 12.0 minutes with a REM latency of 154.0 minutes. The patient spent 4.4%% of the night in stage N1 sleep, 69.5%% in stage N2 sleep, 10.9%% in stage N3 and 15.2% in REM. Wake after sleep onset (WASO) was 16.2 minutes. The Arousal Index was 35.7/hour.   RESPIRATORY PARAMETERS  There were a total of 0 respiratory disturbances out of which 0 were apneas ( 0 obstructive, 0 mixed, 0 central) and 0 hypopneas. The apnea/hypopnea index (AHI) was 0.0 events/hour. The central sleep apnea index  was 0.0 events/hour. The REM AHI was 0.0 events/hour and NREM AHI was 0.0 events/hour. The supine AHI was 0.0 events/hour and the non supine AHI was 0 supine during 33.17% of sleep. Respiratory disturbances were associated with oxygen desaturation down to a nadir of 92.0% during sleep. The mean oxygen saturation during the study was 95.3%. The cumulative time under 88% oxygen saturation was 5.5 minutes.   LEG MOVEMENT DATA  The total leg movements were 0 with a resulting leg movement index of 0.0/hr . Associated arousal with leg movement index was 0.0/hr.   CARDIAC DATA  The underlying cardiac rhythm was most consistent with sinus rhythm. Mean heart rate during sleep was 78.0 bpm. Additional rhythm abnormalities include None.   IMPRESSIONS  No Significant Obstructive Sleep apnea(OSA)  No significant periodic leg movements(PLMs) during sleep.  May proceed with MSLT  DIAGNOSIS  Excessive daytime sleepiness.   RECOMMENDATIONS  May proceed with MSLT   Deretha EmoryJames C Kalijah Westfall Sleep specialist, American Board of Internal Medicine  ELECTRONICALLY SIGNED ON:  11/10/2017, 9:13 PM [facility] PH: [phone]   FX: [phone] ACCREDITED BY THE AMERICAN ACADEMY OF SLEEP MEDICINE

## 2017-11-13 DIAGNOSIS — J3089 Other allergic rhinitis: Secondary | ICD-10-CM | POA: Diagnosis not present

## 2017-11-13 DIAGNOSIS — J301 Allergic rhinitis due to pollen: Secondary | ICD-10-CM | POA: Diagnosis not present

## 2017-11-13 DIAGNOSIS — J3081 Allergic rhinitis due to animal (cat) (dog) hair and dander: Secondary | ICD-10-CM | POA: Diagnosis not present

## 2017-11-20 DIAGNOSIS — J3081 Allergic rhinitis due to animal (cat) (dog) hair and dander: Secondary | ICD-10-CM | POA: Diagnosis not present

## 2017-11-20 DIAGNOSIS — J301 Allergic rhinitis due to pollen: Secondary | ICD-10-CM | POA: Diagnosis not present

## 2017-11-20 DIAGNOSIS — J3089 Other allergic rhinitis: Secondary | ICD-10-CM | POA: Diagnosis not present

## 2017-11-26 DIAGNOSIS — J3089 Other allergic rhinitis: Secondary | ICD-10-CM | POA: Diagnosis not present

## 2017-11-26 DIAGNOSIS — J3081 Allergic rhinitis due to animal (cat) (dog) hair and dander: Secondary | ICD-10-CM | POA: Diagnosis not present

## 2017-11-26 DIAGNOSIS — J301 Allergic rhinitis due to pollen: Secondary | ICD-10-CM | POA: Diagnosis not present

## 2017-11-27 DIAGNOSIS — G4711 Idiopathic hypersomnia with long sleep time: Secondary | ICD-10-CM | POA: Diagnosis not present

## 2017-12-04 DIAGNOSIS — J3089 Other allergic rhinitis: Secondary | ICD-10-CM | POA: Diagnosis not present

## 2017-12-04 DIAGNOSIS — J3081 Allergic rhinitis due to animal (cat) (dog) hair and dander: Secondary | ICD-10-CM | POA: Diagnosis not present

## 2017-12-04 DIAGNOSIS — J301 Allergic rhinitis due to pollen: Secondary | ICD-10-CM | POA: Diagnosis not present

## 2017-12-11 DIAGNOSIS — J3081 Allergic rhinitis due to animal (cat) (dog) hair and dander: Secondary | ICD-10-CM | POA: Diagnosis not present

## 2017-12-11 DIAGNOSIS — J3089 Other allergic rhinitis: Secondary | ICD-10-CM | POA: Diagnosis not present

## 2017-12-11 DIAGNOSIS — J301 Allergic rhinitis due to pollen: Secondary | ICD-10-CM | POA: Diagnosis not present

## 2017-12-17 DIAGNOSIS — J3081 Allergic rhinitis due to animal (cat) (dog) hair and dander: Secondary | ICD-10-CM | POA: Diagnosis not present

## 2017-12-17 DIAGNOSIS — J019 Acute sinusitis, unspecified: Secondary | ICD-10-CM | POA: Diagnosis not present

## 2017-12-17 DIAGNOSIS — J301 Allergic rhinitis due to pollen: Secondary | ICD-10-CM | POA: Diagnosis not present

## 2017-12-17 DIAGNOSIS — J4540 Moderate persistent asthma, uncomplicated: Secondary | ICD-10-CM | POA: Diagnosis not present

## 2018-01-01 DIAGNOSIS — J3081 Allergic rhinitis due to animal (cat) (dog) hair and dander: Secondary | ICD-10-CM | POA: Diagnosis not present

## 2018-01-01 DIAGNOSIS — J301 Allergic rhinitis due to pollen: Secondary | ICD-10-CM | POA: Diagnosis not present

## 2018-01-01 DIAGNOSIS — J3089 Other allergic rhinitis: Secondary | ICD-10-CM | POA: Diagnosis not present

## 2018-01-08 DIAGNOSIS — J301 Allergic rhinitis due to pollen: Secondary | ICD-10-CM | POA: Diagnosis not present

## 2018-01-08 DIAGNOSIS — J3081 Allergic rhinitis due to animal (cat) (dog) hair and dander: Secondary | ICD-10-CM | POA: Diagnosis not present

## 2018-01-08 DIAGNOSIS — J3089 Other allergic rhinitis: Secondary | ICD-10-CM | POA: Diagnosis not present

## 2018-01-10 IMAGING — CR DG CHEST 2V
2 series · 2 of 2 positions shown · non-contrast
Comparison: Chest x-ray of October 01, 2012

CLINICAL DATA: Two months of cough, history of asthma, nonsmoker.

EXAM:
CHEST  2 VIEW

[view not recorded (1 of 2)]
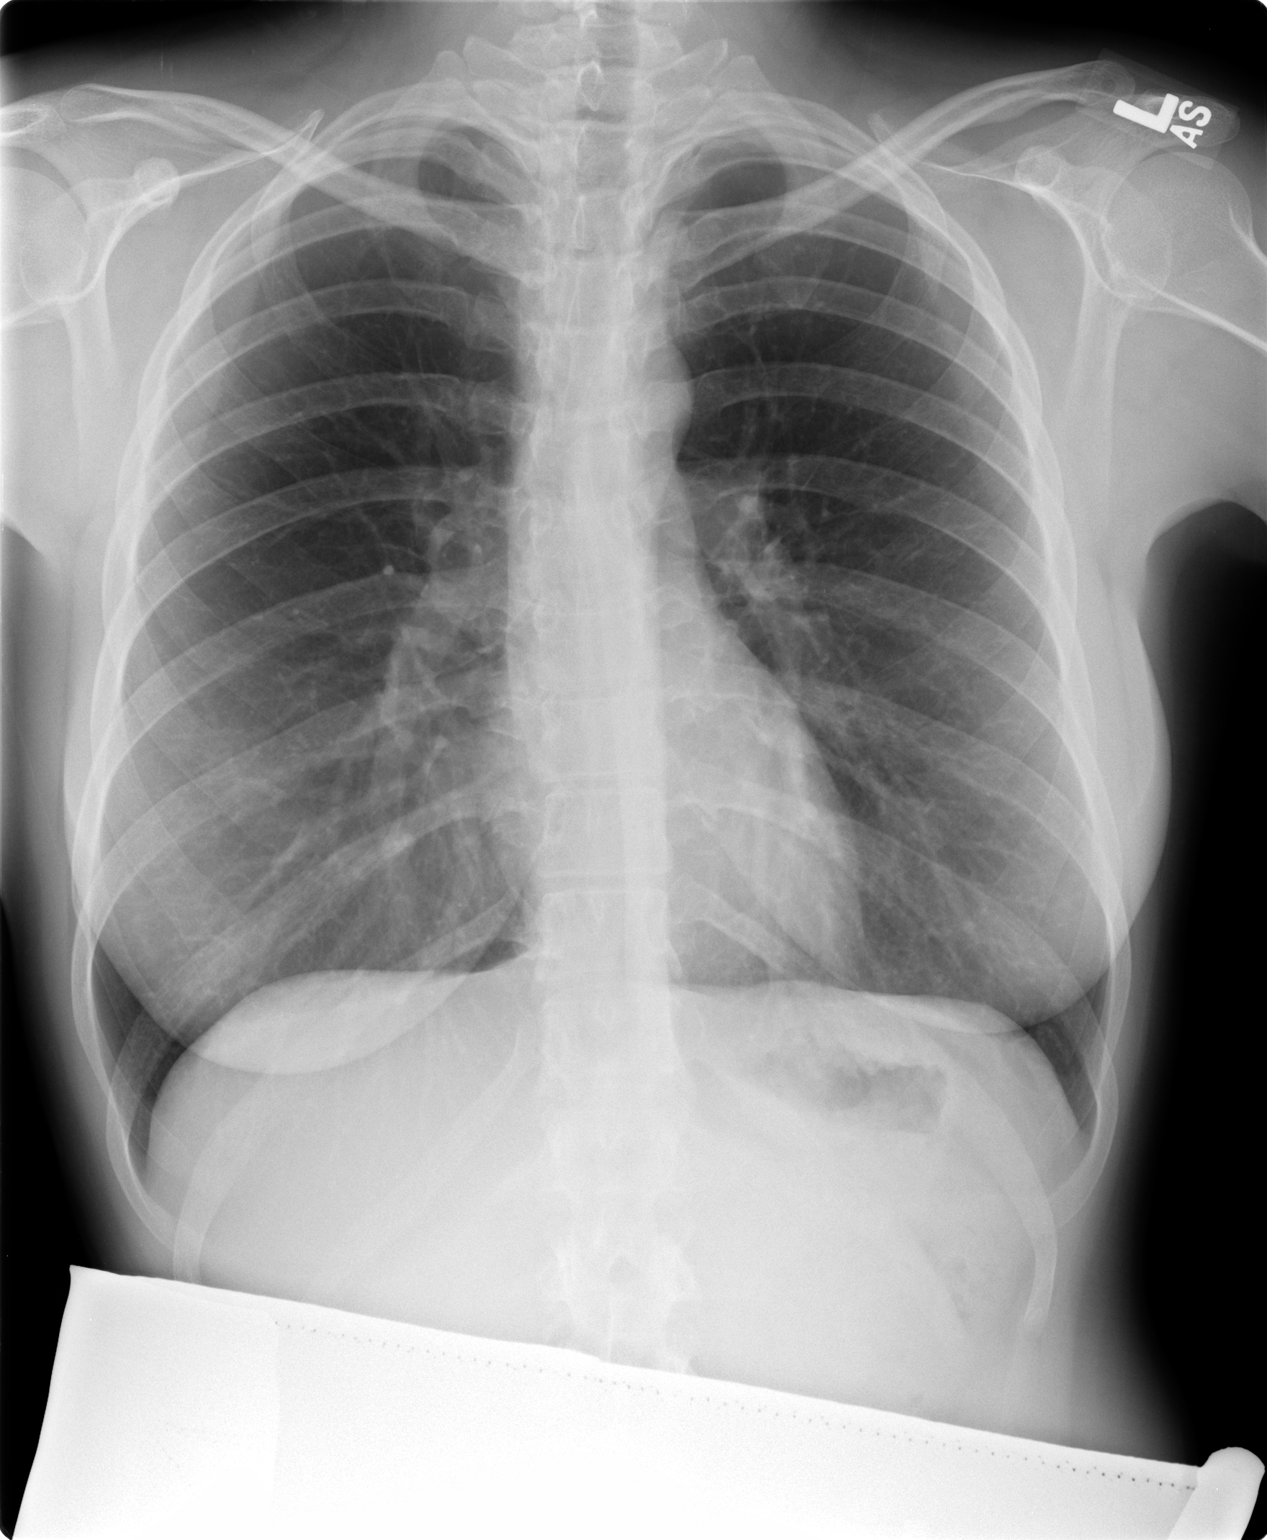

[view not recorded (2 of 2)]
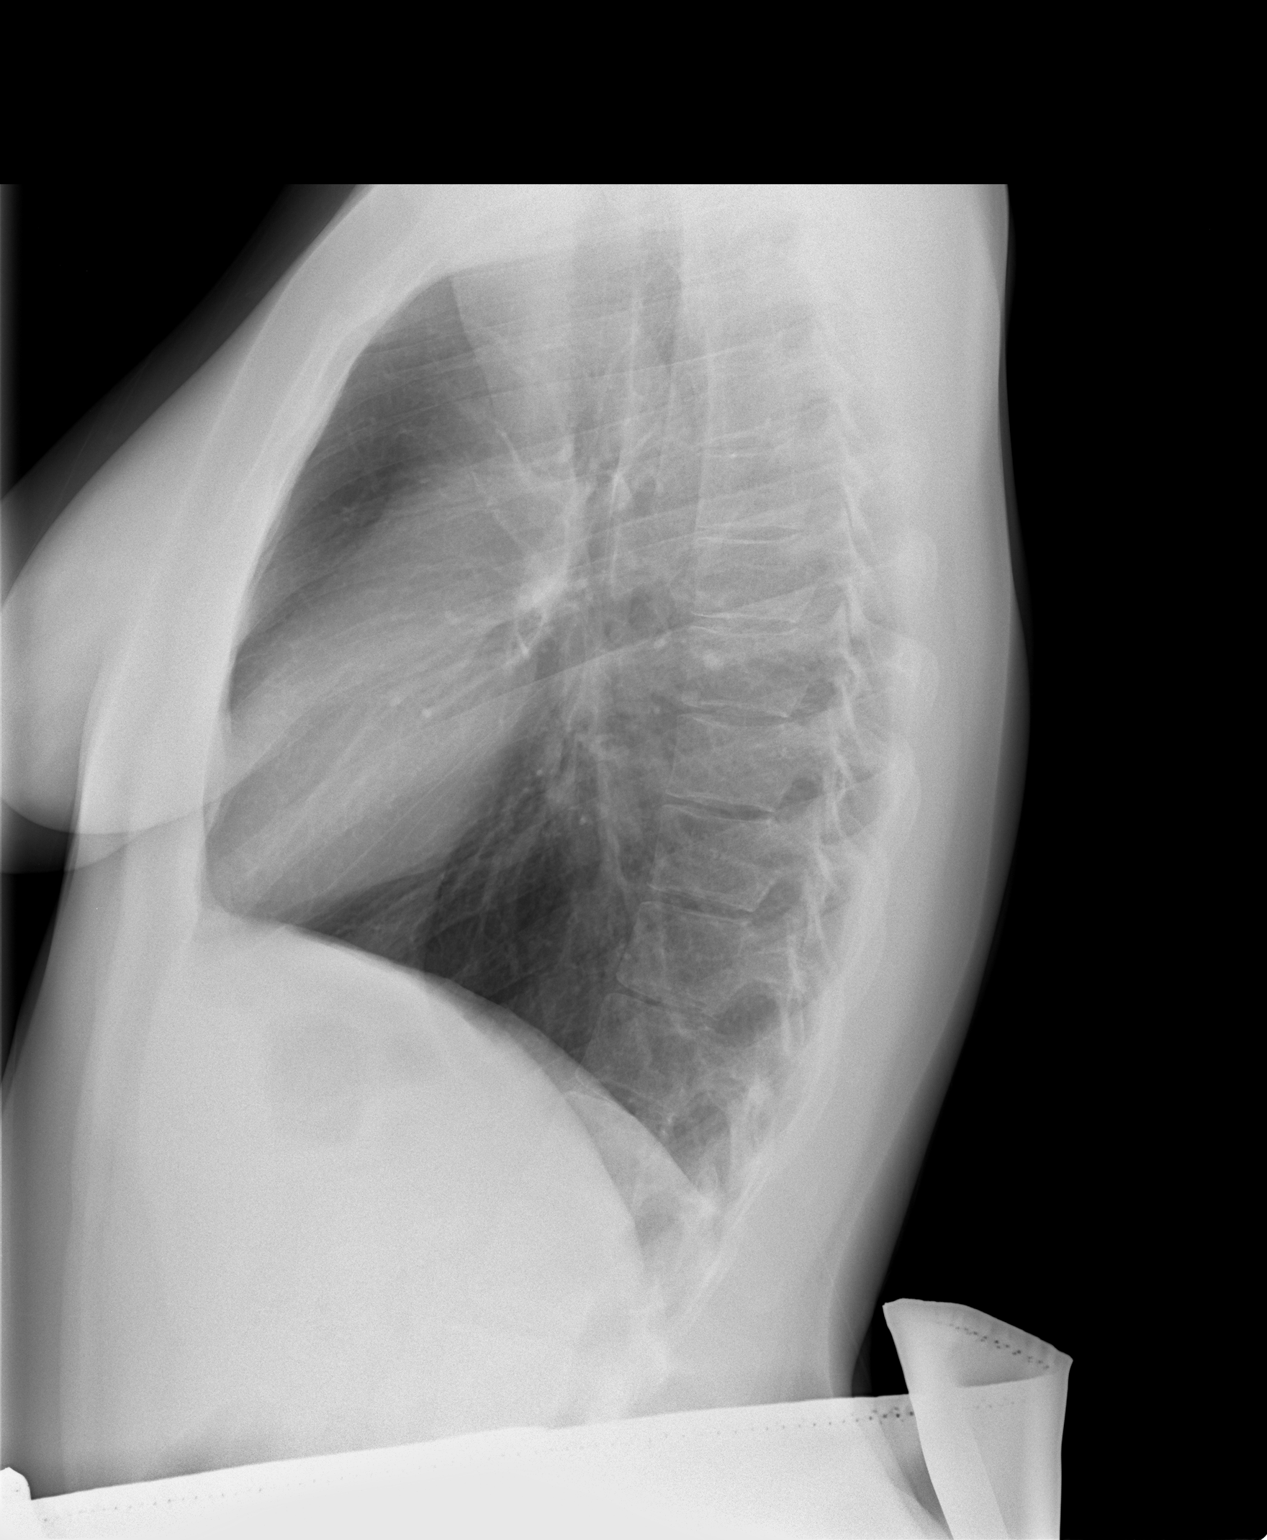

[2 of 2 positions shown; findings below may reference images not displayed]

FINDINGS: The lungs remain mildly hyperinflated. There is no focal infiltrate.
There is no pleural effusion. The heart and pulmonary vascularity
are normal. The mediastinum is normal in width. No pulmonary
parenchymal nodules or masses are observed.
IMPRESSION: Mild hyperinflation consistent with reactive airway disease. There
is no pneumonia, CHF, nor other acute cardiopulmonary abnormality.

## 2018-01-14 DIAGNOSIS — J3081 Allergic rhinitis due to animal (cat) (dog) hair and dander: Secondary | ICD-10-CM | POA: Diagnosis not present

## 2018-01-14 DIAGNOSIS — J3089 Other allergic rhinitis: Secondary | ICD-10-CM | POA: Diagnosis not present

## 2018-01-14 DIAGNOSIS — J301 Allergic rhinitis due to pollen: Secondary | ICD-10-CM | POA: Diagnosis not present

## 2018-01-15 DIAGNOSIS — G4711 Idiopathic hypersomnia with long sleep time: Secondary | ICD-10-CM | POA: Diagnosis not present

## 2018-01-15 DIAGNOSIS — J301 Allergic rhinitis due to pollen: Secondary | ICD-10-CM | POA: Diagnosis not present

## 2018-01-24 DIAGNOSIS — J3089 Other allergic rhinitis: Secondary | ICD-10-CM | POA: Diagnosis not present

## 2018-01-24 DIAGNOSIS — J3081 Allergic rhinitis due to animal (cat) (dog) hair and dander: Secondary | ICD-10-CM | POA: Diagnosis not present

## 2018-01-24 DIAGNOSIS — J301 Allergic rhinitis due to pollen: Secondary | ICD-10-CM | POA: Diagnosis not present

## 2018-01-29 DIAGNOSIS — J3089 Other allergic rhinitis: Secondary | ICD-10-CM | POA: Diagnosis not present

## 2018-01-29 DIAGNOSIS — J301 Allergic rhinitis due to pollen: Secondary | ICD-10-CM | POA: Diagnosis not present

## 2018-01-29 DIAGNOSIS — J3081 Allergic rhinitis due to animal (cat) (dog) hair and dander: Secondary | ICD-10-CM | POA: Diagnosis not present

## 2018-02-05 DIAGNOSIS — J3089 Other allergic rhinitis: Secondary | ICD-10-CM | POA: Diagnosis not present

## 2018-02-05 DIAGNOSIS — J3081 Allergic rhinitis due to animal (cat) (dog) hair and dander: Secondary | ICD-10-CM | POA: Diagnosis not present

## 2018-02-05 DIAGNOSIS — J301 Allergic rhinitis due to pollen: Secondary | ICD-10-CM | POA: Diagnosis not present

## 2018-02-11 DIAGNOSIS — J3089 Other allergic rhinitis: Secondary | ICD-10-CM | POA: Diagnosis not present

## 2018-02-11 DIAGNOSIS — J3081 Allergic rhinitis due to animal (cat) (dog) hair and dander: Secondary | ICD-10-CM | POA: Diagnosis not present

## 2018-02-11 DIAGNOSIS — J301 Allergic rhinitis due to pollen: Secondary | ICD-10-CM | POA: Diagnosis not present

## 2018-02-19 DIAGNOSIS — J3089 Other allergic rhinitis: Secondary | ICD-10-CM | POA: Diagnosis not present

## 2018-02-19 DIAGNOSIS — J301 Allergic rhinitis due to pollen: Secondary | ICD-10-CM | POA: Diagnosis not present

## 2018-02-19 DIAGNOSIS — J3081 Allergic rhinitis due to animal (cat) (dog) hair and dander: Secondary | ICD-10-CM | POA: Diagnosis not present

## 2018-02-26 DIAGNOSIS — J3089 Other allergic rhinitis: Secondary | ICD-10-CM | POA: Diagnosis not present

## 2018-02-26 DIAGNOSIS — J301 Allergic rhinitis due to pollen: Secondary | ICD-10-CM | POA: Diagnosis not present

## 2018-02-26 DIAGNOSIS — J3081 Allergic rhinitis due to animal (cat) (dog) hair and dander: Secondary | ICD-10-CM | POA: Diagnosis not present

## 2018-03-04 DIAGNOSIS — J3089 Other allergic rhinitis: Secondary | ICD-10-CM | POA: Diagnosis not present

## 2018-03-04 DIAGNOSIS — J3081 Allergic rhinitis due to animal (cat) (dog) hair and dander: Secondary | ICD-10-CM | POA: Diagnosis not present

## 2018-03-04 DIAGNOSIS — J301 Allergic rhinitis due to pollen: Secondary | ICD-10-CM | POA: Diagnosis not present

## 2018-03-14 DIAGNOSIS — J3089 Other allergic rhinitis: Secondary | ICD-10-CM | POA: Diagnosis not present

## 2018-03-14 DIAGNOSIS — J3081 Allergic rhinitis due to animal (cat) (dog) hair and dander: Secondary | ICD-10-CM | POA: Diagnosis not present

## 2018-03-14 DIAGNOSIS — J301 Allergic rhinitis due to pollen: Secondary | ICD-10-CM | POA: Diagnosis not present

## 2018-03-18 DIAGNOSIS — J3089 Other allergic rhinitis: Secondary | ICD-10-CM | POA: Diagnosis not present

## 2018-03-18 DIAGNOSIS — J301 Allergic rhinitis due to pollen: Secondary | ICD-10-CM | POA: Diagnosis not present

## 2018-03-18 DIAGNOSIS — J3081 Allergic rhinitis due to animal (cat) (dog) hair and dander: Secondary | ICD-10-CM | POA: Diagnosis not present

## 2018-03-25 DIAGNOSIS — J301 Allergic rhinitis due to pollen: Secondary | ICD-10-CM | POA: Diagnosis not present

## 2018-03-25 DIAGNOSIS — J3081 Allergic rhinitis due to animal (cat) (dog) hair and dander: Secondary | ICD-10-CM | POA: Diagnosis not present

## 2018-03-25 DIAGNOSIS — J3089 Other allergic rhinitis: Secondary | ICD-10-CM | POA: Diagnosis not present

## 2018-04-02 DIAGNOSIS — J3089 Other allergic rhinitis: Secondary | ICD-10-CM | POA: Diagnosis not present

## 2018-04-02 DIAGNOSIS — J301 Allergic rhinitis due to pollen: Secondary | ICD-10-CM | POA: Diagnosis not present

## 2018-04-02 DIAGNOSIS — J3081 Allergic rhinitis due to animal (cat) (dog) hair and dander: Secondary | ICD-10-CM | POA: Diagnosis not present

## 2018-04-08 DIAGNOSIS — S6392XA Sprain of unspecified part of left wrist and hand, initial encounter: Secondary | ICD-10-CM | POA: Diagnosis not present

## 2018-04-08 DIAGNOSIS — M222X1 Patellofemoral disorders, right knee: Secondary | ICD-10-CM | POA: Diagnosis not present

## 2018-04-09 DIAGNOSIS — J3089 Other allergic rhinitis: Secondary | ICD-10-CM | POA: Diagnosis not present

## 2018-04-09 DIAGNOSIS — J301 Allergic rhinitis due to pollen: Secondary | ICD-10-CM | POA: Diagnosis not present

## 2018-04-09 DIAGNOSIS — J3081 Allergic rhinitis due to animal (cat) (dog) hair and dander: Secondary | ICD-10-CM | POA: Diagnosis not present

## 2018-04-14 DIAGNOSIS — G4711 Idiopathic hypersomnia with long sleep time: Secondary | ICD-10-CM | POA: Diagnosis not present

## 2018-04-16 DIAGNOSIS — J301 Allergic rhinitis due to pollen: Secondary | ICD-10-CM | POA: Diagnosis not present

## 2018-04-16 DIAGNOSIS — J3081 Allergic rhinitis due to animal (cat) (dog) hair and dander: Secondary | ICD-10-CM | POA: Diagnosis not present

## 2018-04-16 DIAGNOSIS — J3089 Other allergic rhinitis: Secondary | ICD-10-CM | POA: Diagnosis not present

## 2018-04-22 DIAGNOSIS — J3089 Other allergic rhinitis: Secondary | ICD-10-CM | POA: Diagnosis not present

## 2018-04-22 DIAGNOSIS — J301 Allergic rhinitis due to pollen: Secondary | ICD-10-CM | POA: Diagnosis not present

## 2018-04-22 DIAGNOSIS — J3081 Allergic rhinitis due to animal (cat) (dog) hair and dander: Secondary | ICD-10-CM | POA: Diagnosis not present

## 2018-04-30 DIAGNOSIS — J3089 Other allergic rhinitis: Secondary | ICD-10-CM | POA: Diagnosis not present

## 2018-04-30 DIAGNOSIS — J3081 Allergic rhinitis due to animal (cat) (dog) hair and dander: Secondary | ICD-10-CM | POA: Diagnosis not present

## 2018-04-30 DIAGNOSIS — J301 Allergic rhinitis due to pollen: Secondary | ICD-10-CM | POA: Diagnosis not present

## 2018-05-01 DIAGNOSIS — M79645 Pain in left finger(s): Secondary | ICD-10-CM | POA: Diagnosis not present

## 2018-05-01 DIAGNOSIS — M25561 Pain in right knee: Secondary | ICD-10-CM | POA: Diagnosis not present

## 2018-05-01 DIAGNOSIS — M2550 Pain in unspecified joint: Secondary | ICD-10-CM | POA: Diagnosis not present

## 2018-05-07 DIAGNOSIS — J3081 Allergic rhinitis due to animal (cat) (dog) hair and dander: Secondary | ICD-10-CM | POA: Diagnosis not present

## 2018-05-07 DIAGNOSIS — J301 Allergic rhinitis due to pollen: Secondary | ICD-10-CM | POA: Diagnosis not present

## 2018-05-07 DIAGNOSIS — J3089 Other allergic rhinitis: Secondary | ICD-10-CM | POA: Diagnosis not present

## 2018-05-14 DIAGNOSIS — J301 Allergic rhinitis due to pollen: Secondary | ICD-10-CM | POA: Diagnosis not present

## 2018-05-14 DIAGNOSIS — J3089 Other allergic rhinitis: Secondary | ICD-10-CM | POA: Diagnosis not present

## 2018-05-14 DIAGNOSIS — J3081 Allergic rhinitis due to animal (cat) (dog) hair and dander: Secondary | ICD-10-CM | POA: Diagnosis not present

## 2018-05-28 DIAGNOSIS — J301 Allergic rhinitis due to pollen: Secondary | ICD-10-CM | POA: Diagnosis not present

## 2018-05-28 DIAGNOSIS — J3089 Other allergic rhinitis: Secondary | ICD-10-CM | POA: Diagnosis not present

## 2018-05-28 DIAGNOSIS — J3081 Allergic rhinitis due to animal (cat) (dog) hair and dander: Secondary | ICD-10-CM | POA: Diagnosis not present

## 2018-06-05 DIAGNOSIS — J3089 Other allergic rhinitis: Secondary | ICD-10-CM | POA: Diagnosis not present

## 2018-06-05 DIAGNOSIS — J3081 Allergic rhinitis due to animal (cat) (dog) hair and dander: Secondary | ICD-10-CM | POA: Diagnosis not present

## 2018-06-05 DIAGNOSIS — J301 Allergic rhinitis due to pollen: Secondary | ICD-10-CM | POA: Diagnosis not present

## 2018-06-12 DIAGNOSIS — J3081 Allergic rhinitis due to animal (cat) (dog) hair and dander: Secondary | ICD-10-CM | POA: Diagnosis not present

## 2018-06-12 DIAGNOSIS — J301 Allergic rhinitis due to pollen: Secondary | ICD-10-CM | POA: Diagnosis not present

## 2018-06-12 DIAGNOSIS — J3089 Other allergic rhinitis: Secondary | ICD-10-CM | POA: Diagnosis not present

## 2018-06-17 DIAGNOSIS — J3081 Allergic rhinitis due to animal (cat) (dog) hair and dander: Secondary | ICD-10-CM | POA: Diagnosis not present

## 2018-06-17 DIAGNOSIS — J301 Allergic rhinitis due to pollen: Secondary | ICD-10-CM | POA: Diagnosis not present

## 2018-06-19 DIAGNOSIS — J301 Allergic rhinitis due to pollen: Secondary | ICD-10-CM | POA: Diagnosis not present

## 2018-06-19 DIAGNOSIS — J3081 Allergic rhinitis due to animal (cat) (dog) hair and dander: Secondary | ICD-10-CM | POA: Diagnosis not present

## 2018-06-19 DIAGNOSIS — J3089 Other allergic rhinitis: Secondary | ICD-10-CM | POA: Diagnosis not present

## 2018-06-24 DIAGNOSIS — G4711 Idiopathic hypersomnia with long sleep time: Secondary | ICD-10-CM | POA: Diagnosis not present

## 2018-06-26 DIAGNOSIS — J3081 Allergic rhinitis due to animal (cat) (dog) hair and dander: Secondary | ICD-10-CM | POA: Diagnosis not present

## 2018-06-26 DIAGNOSIS — J3089 Other allergic rhinitis: Secondary | ICD-10-CM | POA: Diagnosis not present

## 2018-06-26 DIAGNOSIS — J301 Allergic rhinitis due to pollen: Secondary | ICD-10-CM | POA: Diagnosis not present

## 2018-06-30 DIAGNOSIS — J301 Allergic rhinitis due to pollen: Secondary | ICD-10-CM | POA: Diagnosis not present

## 2018-06-30 DIAGNOSIS — J4540 Moderate persistent asthma, uncomplicated: Secondary | ICD-10-CM | POA: Diagnosis not present

## 2018-06-30 DIAGNOSIS — J3089 Other allergic rhinitis: Secondary | ICD-10-CM | POA: Diagnosis not present

## 2018-06-30 DIAGNOSIS — J3081 Allergic rhinitis due to animal (cat) (dog) hair and dander: Secondary | ICD-10-CM | POA: Diagnosis not present

## 2018-07-04 DIAGNOSIS — R0789 Other chest pain: Secondary | ICD-10-CM | POA: Diagnosis not present

## 2018-07-04 DIAGNOSIS — J45909 Unspecified asthma, uncomplicated: Secondary | ICD-10-CM | POA: Diagnosis not present

## 2018-07-04 DIAGNOSIS — R509 Fever, unspecified: Secondary | ICD-10-CM | POA: Diagnosis not present

## 2018-07-04 DIAGNOSIS — J309 Allergic rhinitis, unspecified: Secondary | ICD-10-CM | POA: Diagnosis not present

## 2018-07-07 DIAGNOSIS — R509 Fever, unspecified: Secondary | ICD-10-CM | POA: Diagnosis not present

## 2018-07-14 DIAGNOSIS — K900 Celiac disease: Secondary | ICD-10-CM | POA: Diagnosis not present

## 2018-07-18 DIAGNOSIS — J3089 Other allergic rhinitis: Secondary | ICD-10-CM | POA: Diagnosis not present

## 2018-07-18 DIAGNOSIS — J301 Allergic rhinitis due to pollen: Secondary | ICD-10-CM | POA: Diagnosis not present

## 2018-07-18 DIAGNOSIS — J3081 Allergic rhinitis due to animal (cat) (dog) hair and dander: Secondary | ICD-10-CM | POA: Diagnosis not present

## 2018-07-24 DIAGNOSIS — J3081 Allergic rhinitis due to animal (cat) (dog) hair and dander: Secondary | ICD-10-CM | POA: Diagnosis not present

## 2018-07-24 DIAGNOSIS — J3089 Other allergic rhinitis: Secondary | ICD-10-CM | POA: Diagnosis not present

## 2018-07-24 DIAGNOSIS — J301 Allergic rhinitis due to pollen: Secondary | ICD-10-CM | POA: Diagnosis not present

## 2018-07-30 DIAGNOSIS — J3081 Allergic rhinitis due to animal (cat) (dog) hair and dander: Secondary | ICD-10-CM | POA: Diagnosis not present

## 2018-07-30 DIAGNOSIS — J3089 Other allergic rhinitis: Secondary | ICD-10-CM | POA: Diagnosis not present

## 2018-07-30 DIAGNOSIS — J301 Allergic rhinitis due to pollen: Secondary | ICD-10-CM | POA: Diagnosis not present

## 2018-08-08 DIAGNOSIS — J301 Allergic rhinitis due to pollen: Secondary | ICD-10-CM | POA: Diagnosis not present

## 2018-08-08 DIAGNOSIS — J3081 Allergic rhinitis due to animal (cat) (dog) hair and dander: Secondary | ICD-10-CM | POA: Diagnosis not present

## 2018-08-08 DIAGNOSIS — J3089 Other allergic rhinitis: Secondary | ICD-10-CM | POA: Diagnosis not present

## 2018-08-15 DIAGNOSIS — J3081 Allergic rhinitis due to animal (cat) (dog) hair and dander: Secondary | ICD-10-CM | POA: Diagnosis not present

## 2018-08-15 DIAGNOSIS — J301 Allergic rhinitis due to pollen: Secondary | ICD-10-CM | POA: Diagnosis not present

## 2018-08-15 DIAGNOSIS — J3089 Other allergic rhinitis: Secondary | ICD-10-CM | POA: Diagnosis not present

## 2018-08-21 DIAGNOSIS — J301 Allergic rhinitis due to pollen: Secondary | ICD-10-CM | POA: Diagnosis not present

## 2018-08-21 DIAGNOSIS — J3089 Other allergic rhinitis: Secondary | ICD-10-CM | POA: Diagnosis not present

## 2018-08-21 DIAGNOSIS — J3081 Allergic rhinitis due to animal (cat) (dog) hair and dander: Secondary | ICD-10-CM | POA: Diagnosis not present

## 2018-08-25 DIAGNOSIS — J3089 Other allergic rhinitis: Secondary | ICD-10-CM | POA: Diagnosis not present

## 2018-08-25 DIAGNOSIS — J019 Acute sinusitis, unspecified: Secondary | ICD-10-CM | POA: Diagnosis not present

## 2018-08-25 DIAGNOSIS — J3081 Allergic rhinitis due to animal (cat) (dog) hair and dander: Secondary | ICD-10-CM | POA: Diagnosis not present

## 2018-08-25 DIAGNOSIS — J301 Allergic rhinitis due to pollen: Secondary | ICD-10-CM | POA: Diagnosis not present

## 2018-09-04 DIAGNOSIS — J3089 Other allergic rhinitis: Secondary | ICD-10-CM | POA: Diagnosis not present

## 2018-09-04 DIAGNOSIS — J301 Allergic rhinitis due to pollen: Secondary | ICD-10-CM | POA: Diagnosis not present

## 2018-09-04 DIAGNOSIS — J3081 Allergic rhinitis due to animal (cat) (dog) hair and dander: Secondary | ICD-10-CM | POA: Diagnosis not present

## 2018-09-11 DIAGNOSIS — J301 Allergic rhinitis due to pollen: Secondary | ICD-10-CM | POA: Diagnosis not present

## 2018-09-11 DIAGNOSIS — J3089 Other allergic rhinitis: Secondary | ICD-10-CM | POA: Diagnosis not present

## 2018-09-11 DIAGNOSIS — J3081 Allergic rhinitis due to animal (cat) (dog) hair and dander: Secondary | ICD-10-CM | POA: Diagnosis not present

## 2018-09-15 DIAGNOSIS — J3089 Other allergic rhinitis: Secondary | ICD-10-CM | POA: Diagnosis not present

## 2018-09-17 DIAGNOSIS — J3089 Other allergic rhinitis: Secondary | ICD-10-CM | POA: Diagnosis not present

## 2018-09-17 DIAGNOSIS — J3081 Allergic rhinitis due to animal (cat) (dog) hair and dander: Secondary | ICD-10-CM | POA: Diagnosis not present

## 2018-09-17 DIAGNOSIS — J301 Allergic rhinitis due to pollen: Secondary | ICD-10-CM | POA: Diagnosis not present

## 2018-09-26 DIAGNOSIS — J3081 Allergic rhinitis due to animal (cat) (dog) hair and dander: Secondary | ICD-10-CM | POA: Diagnosis not present

## 2018-09-26 DIAGNOSIS — J3089 Other allergic rhinitis: Secondary | ICD-10-CM | POA: Diagnosis not present

## 2018-09-26 DIAGNOSIS — J301 Allergic rhinitis due to pollen: Secondary | ICD-10-CM | POA: Diagnosis not present

## 2018-10-03 DIAGNOSIS — J301 Allergic rhinitis due to pollen: Secondary | ICD-10-CM | POA: Diagnosis not present

## 2018-10-03 DIAGNOSIS — J3089 Other allergic rhinitis: Secondary | ICD-10-CM | POA: Diagnosis not present

## 2018-10-07 DIAGNOSIS — F339 Major depressive disorder, recurrent, unspecified: Secondary | ICD-10-CM | POA: Diagnosis not present

## 2018-10-07 DIAGNOSIS — J45909 Unspecified asthma, uncomplicated: Secondary | ICD-10-CM | POA: Diagnosis not present

## 2018-10-07 DIAGNOSIS — R51 Headache: Secondary | ICD-10-CM | POA: Diagnosis not present

## 2018-10-07 DIAGNOSIS — Z0000 Encounter for general adult medical examination without abnormal findings: Secondary | ICD-10-CM | POA: Diagnosis not present

## 2018-10-07 DIAGNOSIS — J309 Allergic rhinitis, unspecified: Secondary | ICD-10-CM | POA: Diagnosis not present

## 2018-10-09 DIAGNOSIS — J3089 Other allergic rhinitis: Secondary | ICD-10-CM | POA: Diagnosis not present

## 2018-10-09 DIAGNOSIS — J3081 Allergic rhinitis due to animal (cat) (dog) hair and dander: Secondary | ICD-10-CM | POA: Diagnosis not present

## 2018-10-09 DIAGNOSIS — J301 Allergic rhinitis due to pollen: Secondary | ICD-10-CM | POA: Diagnosis not present

## 2018-10-10 DIAGNOSIS — Z8349 Family history of other endocrine, nutritional and metabolic diseases: Secondary | ICD-10-CM | POA: Diagnosis not present

## 2018-10-10 DIAGNOSIS — Z23 Encounter for immunization: Secondary | ICD-10-CM | POA: Diagnosis not present

## 2018-10-10 DIAGNOSIS — K900 Celiac disease: Secondary | ICD-10-CM | POA: Diagnosis not present

## 2018-10-16 DIAGNOSIS — J3089 Other allergic rhinitis: Secondary | ICD-10-CM | POA: Diagnosis not present

## 2018-10-16 DIAGNOSIS — J301 Allergic rhinitis due to pollen: Secondary | ICD-10-CM | POA: Diagnosis not present

## 2018-10-16 DIAGNOSIS — J3081 Allergic rhinitis due to animal (cat) (dog) hair and dander: Secondary | ICD-10-CM | POA: Diagnosis not present

## 2018-10-24 DIAGNOSIS — J301 Allergic rhinitis due to pollen: Secondary | ICD-10-CM | POA: Diagnosis not present

## 2018-10-24 DIAGNOSIS — J3081 Allergic rhinitis due to animal (cat) (dog) hair and dander: Secondary | ICD-10-CM | POA: Diagnosis not present

## 2018-10-24 DIAGNOSIS — J3089 Other allergic rhinitis: Secondary | ICD-10-CM | POA: Diagnosis not present

## 2018-10-31 DIAGNOSIS — J3081 Allergic rhinitis due to animal (cat) (dog) hair and dander: Secondary | ICD-10-CM | POA: Diagnosis not present

## 2018-10-31 DIAGNOSIS — J301 Allergic rhinitis due to pollen: Secondary | ICD-10-CM | POA: Diagnosis not present

## 2018-10-31 DIAGNOSIS — J3089 Other allergic rhinitis: Secondary | ICD-10-CM | POA: Diagnosis not present

## 2018-11-07 DIAGNOSIS — J3081 Allergic rhinitis due to animal (cat) (dog) hair and dander: Secondary | ICD-10-CM | POA: Diagnosis not present

## 2018-11-07 DIAGNOSIS — J301 Allergic rhinitis due to pollen: Secondary | ICD-10-CM | POA: Diagnosis not present

## 2018-11-07 DIAGNOSIS — J3089 Other allergic rhinitis: Secondary | ICD-10-CM | POA: Diagnosis not present

## 2018-11-12 DIAGNOSIS — J301 Allergic rhinitis due to pollen: Secondary | ICD-10-CM | POA: Diagnosis not present

## 2018-11-12 DIAGNOSIS — J3089 Other allergic rhinitis: Secondary | ICD-10-CM | POA: Diagnosis not present

## 2018-11-12 DIAGNOSIS — J3081 Allergic rhinitis due to animal (cat) (dog) hair and dander: Secondary | ICD-10-CM | POA: Diagnosis not present

## 2018-11-21 DIAGNOSIS — J3089 Other allergic rhinitis: Secondary | ICD-10-CM | POA: Diagnosis not present

## 2018-11-21 DIAGNOSIS — J301 Allergic rhinitis due to pollen: Secondary | ICD-10-CM | POA: Diagnosis not present

## 2018-11-21 DIAGNOSIS — J3081 Allergic rhinitis due to animal (cat) (dog) hair and dander: Secondary | ICD-10-CM | POA: Diagnosis not present

## 2018-11-26 ENCOUNTER — Telehealth: Payer: Self-pay | Admitting: Licensed Clinical Social Worker

## 2018-11-26 NOTE — Telephone Encounter (Signed)
Called patient regarding upcoming Webex appointment, this will be a walk-in visit due to no communication to set this up as virtual.

## 2018-11-26 NOTE — Telephone Encounter (Signed)
I received a genetic counseling referral from [facility], [place] for  genetic testing:  PTEN, BRAF V600E. Ms. Offenberger has been cld and scheduled to see Faith Rogue on 9/10 at 9am. She's been made aware to arrive 15 minutes early.

## 2018-11-27 ENCOUNTER — Encounter: Payer: Self-pay | Admitting: Licensed Clinical Social Worker

## 2018-11-27 ENCOUNTER — Other Ambulatory Visit: Payer: Self-pay

## 2018-11-27 ENCOUNTER — Inpatient Hospital Stay: Payer: BC Managed Care – PPO | Attending: Genetic Counselor | Admitting: Licensed Clinical Social Worker

## 2018-11-27 ENCOUNTER — Inpatient Hospital Stay: Payer: BC Managed Care – PPO

## 2018-11-27 DIAGNOSIS — Z8371 Family history of colonic polyps: Secondary | ICD-10-CM

## 2018-11-27 DIAGNOSIS — Z803 Family history of malignant neoplasm of breast: Secondary | ICD-10-CM

## 2018-11-27 DIAGNOSIS — Z808 Family history of malignant neoplasm of other organs or systems: Secondary | ICD-10-CM

## 2018-11-27 DIAGNOSIS — Z800 Family history of malignant neoplasm of digestive organs: Secondary | ICD-10-CM

## 2018-11-27 DIAGNOSIS — Z83719 Family history of colon polyps, unspecified: Secondary | ICD-10-CM | POA: Insufficient documentation

## 2018-11-27 DIAGNOSIS — Z1379 Encounter for other screening for genetic and chromosomal anomalies: Secondary | ICD-10-CM

## 2018-11-27 NOTE — Progress Notes (Signed)
REFERRING PROVIDER: Lennie Odor, PA-C [address] [place],  [place] [address]  PRIMARY PROVIDER:  Lennie Odor, PA-C  PRIMARY REASON FOR VISIT:  1. Family history of colon cancer   2. Family history of thyroid cancer   3. Family history of breast cancer   4. Family history of melanoma   5. Family history of colonic polyps      HISTORY OF PRESENT ILLNESS:   Pamela Norton, a 28 y.o. female, was seen for a [place] cancer genetics consultation at the request of Dr. Barrie Folk due to a family history of cancer, and at the patient's mother's doctor's suggestion.  [name] presents to clinic today to discuss the possibility of a hereditary predisposition to cancer, genetic testing, and to further clarify her future cancer risks, as well as potential cancer risks for family members.   [name] is a 28 y.o. female with no personal history of cancer.    CANCER HISTORY:  Oncology History   No history exists.   RISK FACTORS:  Menarche was at age 65.  First live birth at age no children.  OCP use for approximately 10 years.  Ovaries intact: yes.  Hysterectomy: no.  Menopausal status: premenopausal.  HRT use: 0 years. Colonoscopy: no; not examined. Mammogram within the last year: no. Number of breast biopsies: 0. Up to date with pelvic exams: yes. Any excessive radiation exposure in the past: no  Past Medical History:  Diagnosis Date  . Anxiety   . Asthma   . Celiac disease   . Depression   . Family history of breast cancer   . Family history of colon cancer   . Family history of colonic polyps   . Family history of melanoma   . Family history of thyroid cancer   . Migraines     Past Surgical History:  Procedure Laterality Date  . WISDOM TOOTH EXTRACTION    . 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 Marital status: Single    Spouse name: Not on file  . Number of children: Not  on file  . Years of education: Not on file  . Highest education level: Not on file  Occupational History  . Not on file  Social Needs  . Financial resource strain: Not on file  . Food insecurity    Worry: Not on file    Inability: Not on file  . Transportation needs    Medical: Not on file    Non-medical: Not on file  Tobacco Use  . Smoking status: Never Smoker  . Smokeless tobacco: Never Used  Substance and Sexual Activity  . Alcohol use: Yes    Alcohol/week: 1.0 standard drinks    Types: 1 Cans of beer per week  . Drug use: No  . Sexual activity: Never    Birth control/protection: Pill  Lifestyle  . Physical activity    Days per week: Not on file    Minutes per session: Not on file  . Stress: Not on file  Relationships  . Social Herbalist on phone: Not on file    Gets together: Not on file    Attends religious service: Not on file    Active member of club or organization: Not on file    Attends meetings of clubs or organizations: Not on file    Relationship status: Not on  file  Other Topics Concern  . Not on file  Social History Narrative  . Not on file     FAMILY HISTORY:  We obtained a detailed, 4-generation family history.  Significant diagnoses are listed below: Family History  Problem Relation Age of Onset  . Thyroid disease Mother   . Hypothyroidism Mother   . Osteoarthritis Mother   . Fibromyalgia Mother   . Restless legs syndrome Mother   . Thyroid cancer Mother 52       Papillary  . Melanoma Mother 45  . Colon polyps Mother        cscope every 3 years  . Colon cancer Maternal Grandmother 74  . Kidney cancer Maternal Grandfather 72  . Colon polyps Father   . Dementia Paternal Grandmother   . Throat cancer Paternal Grandfather   . Melanoma Paternal Grandfather   . Breast cancer Other    [name] does not have children or siblings.  [name] mother was recently diagnosed with papillary thyroid cancer at age 36. She also had a  melanoma at 92, and has had colon polyps. Patient is unsure how many or what type of colon polyps, but notes her mother gets colonoscopies every 3 years. Patient has 1 maternal uncle that she has limited information about, no cancer diagnoses she is aware of. No cancers in maternal cousins. Her maternal grandmother was diagnosed with colon cancer at 21 and is living at 29. Maternal grandfather was diagnosed with kidney cancer at 56 and died at 46. This grandfather's sister, patient's great aunt, had breast cancer at 85.   [name] father is living at 66 and has had precancerous polyps in the past, patient unsure how many. Patient's father was an only child. Patient's maternal grandmother is living at 71 with dementia. Paternal grandfather had throat cancer at 21, melanoma at 64, and died at 28. This grandfather had a brother who had mouth cancer, and a sister who had bone cancer.  [name] is unaware of previous family history of genetic testing for hereditary cancer risks. There is no reported Ashkenazi Jewish ancestry. There is no known consanguinity.  GENETIC COUNSELING ASSESSMENT: [name] is a 28 y.o. female with a family history of cancer that is slightly suggestive of a hereditary cancer syndrome. We, therefore, discussed and recommended the following at today's visit.   DISCUSSION: We discussed that 5 -10% of cancer is hereditary. She reports her mother's doctors suggested testing for the PTEN gene in particular, so we discussed this gene and Cowden syndrome. She does not meet testing criteria for this syndrome, but there are a few cancers in her family that are part of minor criteria for testing such as kidney, colon and papillary thyroid cancer, however these are not all coming from the same side of her mom's family. We discussed that it's possible the type of polyps her mother has had could be the type that we see with this condition, which would then make her history more suggestive of  Cowden syndrome. There are other genes that can be associated with hereditary cancer syndromes as well, each one is unique with it's own cancers associated.  We discussed that testing is beneficial for several reasons including knowing how to follow individuals for cancer screenings and understanding if other family members could be at risk for cancer and allowing them to undergo genetic testing.   We reviewed the characteristics, features and inheritance patterns of hereditary cancer syndromes. We also discussed genetic testing, including  the appropriate family members to test, the process of testing, insurance coverage and turn-around-time for results. We discussed the implications of a negative, positive and/or variant of uncertain significant result. We discussed with Ms. Benecke that she does not technically meet testing criteria, but that we could still offer her testing if she was interested. She is interested in having this information, therefore we recommended Ms. Ku pursue genetic testing for the [facility] CancerNext-Expanded + RNAinsight gene panel.   The CancerNext-Expanded gene panel offered by [facility] and includes sequencing and rearrangement analysis for the following 67 genes:  AIP, ALK, APC*, ATM*, BAP1, BARD1, BLM, BMPR1A, BRCA1*, BRCA2*, BRIP1*, CDH1*, CDK4, CDKN1B, CDKN2A, CHEK2*, DICER1, FANCC, FH, FLCN, GALNT12, HOXB13, MAX, MEN1, MET, MLH1*, MRE11A, MSH2*, MSH6*, MUTYH*, NBN, NF1*, NF2, PALB2*, PHOX2B, PMS2*, POLD1, POLE, POT1, PRKAR1A, PTCH1, PTEN*, RAD50, RAD51C*, RAD51D*, RB1, RET, SDHA, SDHAF2, SDHB, SDHC, SDHD, SMAD4, SMARCA4, SMARCB1, SMARCE1, STK11, SUFU, TMEM127, TP53*, TSC1, TSC2, VHL and XRCC2 (sequencing and deletion/duplication); MITF (sequencing only); EPCAM and GREM1 (deletion/duplication only). DNA and RNA analyses performed for * genes.   Based on Ms. Schaffert's family history of cancer, she does not meet medical criteria for genetic testing. She has elected to pay the  patient pay price of $249 for her testing, and not go through insurance.  We discussed that some people do not want to undergo genetic testing due to fear of genetic discrimination.  A federal law called the Genetic Information Non-Discrimination Act (GINA) of 2008 helps protect individuals against genetic discrimination based on their genetic test results.  It impacts both health insurance and employment.  For health insurance, it protects against increased premiums, being kicked off insurance or being forced to take a test in order to be insured.  For employment it protects against hiring, firing and promoting decisions based on genetic test results.  Health status due to a cancer diagnosis is not protected under GINA.  This law does not protect life insurance, disability insurance, or other types of insurance.      PLAN: After considering the risks, benefits, and limitations, Ms. Janicki provided informed consent to pursue genetic testing and the blood sample was sent to [facility] for analysis of the CancerNext Expanded + RNAinsight panel. Results should be available within approximately 2-3 weeks' time, at which point they will be disclosed by telephone to Ms. Eifler, as will any additional recommendations warranted by these results. Ms. Emry will receive a summary of her genetic counseling visit and a copy of her results once available. This information will also be available in Epic.   Lastly, we encouraged Ms. Burford to remain in contact with cancer genetics annually so that we can continuously update the family history and inform her of any changes in cancer genetics and testing that may be of benefit for this family.   Ms. Becht questions were answered to her satisfaction today. Our contact information was provided should additional questions or concerns arise. Thank you for the referral and allowing [place] to share in the care of your patient.   Faith Rogue, MS, [facility] Genetic  [email or web address] Phone: [phone]  The patient was seen for a total of 35 minutes in face-to-face genetic counseling.  Drs. Magrinat, Lindi Adie and/or Burr Medico were available for discussion regarding this case.   _______________________________________________________________________ For Office Staff:  Number of people involved in session: 1 Was an Intern/ student involved with case: no

## 2018-11-28 DIAGNOSIS — J3089 Other allergic rhinitis: Secondary | ICD-10-CM | POA: Diagnosis not present

## 2018-11-28 DIAGNOSIS — J3081 Allergic rhinitis due to animal (cat) (dog) hair and dander: Secondary | ICD-10-CM | POA: Diagnosis not present

## 2018-11-28 DIAGNOSIS — J301 Allergic rhinitis due to pollen: Secondary | ICD-10-CM | POA: Diagnosis not present

## 2018-12-05 DIAGNOSIS — J301 Allergic rhinitis due to pollen: Secondary | ICD-10-CM | POA: Diagnosis not present

## 2018-12-05 DIAGNOSIS — J3081 Allergic rhinitis due to animal (cat) (dog) hair and dander: Secondary | ICD-10-CM | POA: Diagnosis not present

## 2018-12-05 DIAGNOSIS — J3089 Other allergic rhinitis: Secondary | ICD-10-CM | POA: Diagnosis not present

## 2018-12-10 DIAGNOSIS — J3089 Other allergic rhinitis: Secondary | ICD-10-CM | POA: Diagnosis not present

## 2018-12-10 DIAGNOSIS — J301 Allergic rhinitis due to pollen: Secondary | ICD-10-CM | POA: Diagnosis not present

## 2018-12-10 DIAGNOSIS — J3081 Allergic rhinitis due to animal (cat) (dog) hair and dander: Secondary | ICD-10-CM | POA: Diagnosis not present

## 2018-12-12 ENCOUNTER — Ambulatory Visit: Payer: Self-pay | Admitting: Licensed Clinical Social Worker

## 2018-12-12 ENCOUNTER — Telehealth: Payer: Self-pay | Admitting: Licensed Clinical Social Worker

## 2018-12-12 ENCOUNTER — Encounter: Payer: Self-pay | Admitting: Licensed Clinical Social Worker

## 2018-12-12 DIAGNOSIS — Z800 Family history of malignant neoplasm of digestive organs: Secondary | ICD-10-CM

## 2018-12-12 DIAGNOSIS — Z808 Family history of malignant neoplasm of other organs or systems: Secondary | ICD-10-CM

## 2018-12-12 DIAGNOSIS — Z1379 Encounter for other screening for genetic and chromosomal anomalies: Secondary | ICD-10-CM

## 2018-12-12 DIAGNOSIS — Z23 Encounter for immunization: Secondary | ICD-10-CM | POA: Diagnosis not present

## 2018-12-12 DIAGNOSIS — Z8371 Family history of colonic polyps: Secondary | ICD-10-CM

## 2018-12-12 DIAGNOSIS — Z803 Family history of malignant neoplasm of breast: Secondary | ICD-10-CM

## 2018-12-12 NOTE — Telephone Encounter (Signed)
Revealed negative genetic testing. This normal result is reassuring.  It is unlikely that there is an increased risk of cancer due to a mutation in one of these genes.  However, genetic testing is not perfect, and cannot definitively rule out a hereditary cause.  It will be important for her to keep in contact with genetics to learn if any additional testing may be needed in the future.   We also discussed it is possible that her mother had genetic testing on her thryoid tumor which may have revealed a PTEN mutation in the tumor itself.

## 2018-12-12 NOTE — Progress Notes (Signed)
HPI:  Ms. Giambra was previously seen in the [facility] due to a family history of cancer and concerns regarding a hereditary predisposition to cancer. Please refer to our prior cancer genetics clinic note for more information regarding our discussion, assessment and recommendations, at the time. Ms. Lisanti recent genetic test results were disclosed to her, as were recommendations warranted by these results. These results and recommendations are discussed in more detail below.  CANCER HISTORY:  Oncology History   No history exists.    FAMILY HISTORY:  We obtained a detailed, 4-generation family history.  Significant diagnoses are listed below: Family History  Problem Relation Age of Onset   Thyroid disease Mother    Hypothyroidism Mother    Osteoarthritis Mother    Fibromyalgia Mother    Restless legs syndrome Mother    Thyroid cancer Mother 34       Papillary   Melanoma Mother 40   Colon polyps Mother        cscope every 3 years   Colon cancer Maternal Grandmother 37   Kidney cancer Maternal Grandfather 80   Colon polyps Father    Dementia Paternal Grandmother    Throat cancer Paternal Grandfather    Melanoma Paternal Grandfather    Breast cancer Other    Ms. Puerta does not have children or siblings.  Ms. Foell mother was recently diagnosed with papillary thyroid cancer at age 27. She also had a melanoma at 53, and has had colon polyps. Patient is unsure how many or what type of colon polyps, but notes her mother gets colonoscopies every 3 years. Patient has 1 maternal uncle that she has limited information about, no cancer diagnoses she is aware of. No cancers in maternal cousins. Her maternal grandmother was diagnosed with colon cancer at 39 and is living at 37. Maternal grandfather was diagnosed with kidney cancer at 28 and died at 64. This grandfather's sister, patient's great aunt, had breast cancer at 50.   Ms. Carelli father is living  at 78 and has had precancerous polyps in the past, patient unsure how many. Patient's father was an only child. Patient's maternal grandmother is living at 87 with dementia. Paternal grandfather had throat cancer at 49, melanoma at 34, and died at 64. This grandfather had a brother who had mouth cancer, and a sister who had bone cancer.  Ms. Travaglini is unaware of previous family history of genetic testing for hereditary cancer risks. There is no reported Ashkenazi Jewish ancestry. There is no known consanguinity.  GENETIC TEST RESULTS: Genetic testing reported out on 12/12/2018 through the [facility] Expanded+ RNAinsight cancer panel found no pathogenic mutations. The CancerNext-Expanded + RNAinsight gene panel offered by [employer] and includes sequencing and rearrangement analysis for the following 77 genes: IP, ALK, APC*, ATM*, AXIN2, BAP1, BARD1, BLM, BMPR1A, BRCA1*, BRCA2*, BRIP1*, CDC73, CDH1*,CDK4, CDKN1B, CDKN2A, CHEK2*, CTNNA1, DICER1, FANCC, FH, FLCN, GALNT12, KIF1B, LZTR1, MAX, MEN1, MET, MLH1*, MSH2*, MSH3, MSH6*, MUTYH*, NBN, NF1*, NF2, NTHL1, PALB2*, PHOX2B, PMS2*, POT1, PRKAR1A, PTCH1, PTEN*, RAD51C*, RAD51D*,RB1, RECQL, RET, SDHA, SDHAF2, SDHB, SDHC, SDHD, SMAD4, SMARCA4, SMARCB1, SMARCE1, STK11, SUFU, TMEM127, TP53*,TSC1, TSC2, VHL and XRCC2 (sequencing and deletion/duplication); EGFR, EGLN1, HOXB13, KIT, MITF, PDGFRA, POLD1 and POLE (sequencing only); EPCAM and GREM1 (deletion/duplication only). DNA and RNA analyses performed for * genes. The test report has been scanned into EPIC and is located under the Molecular Pathology section of the Results Review tab.  A portion of the result report is included below  for reference.     We discussed with Ms. Faulstich that because current genetic testing is not perfect, it is possible there may be a gene mutation in one of these genes that current testing cannot detect, but that chance is small.  We also discussed, that there could be another gene  that has not yet been discovered, or that we have not yet tested, that is responsible for the cancer diagnoses in the family. It is also possible there is a hereditary cause for the cancer in the family that Ms. Ewton did not inherit and therefore was not identified in her testing.  Therefore, it is important to remain in touch with cancer genetics in the future so that we can continue to offer Ms. Angelini the most up to date genetic testing.   ADDITIONAL GENETIC TESTING: We discussed with Ms. Alfonzo that her genetic testing was fairly extensive.  If there are genes identified to increase cancer risk that can be analyzed in the future, we would be happy to discuss and coordinate this testing at that time.    CANCER SCREENING RECOMMENDATIONS: Ms. Tessier test result is considered negative (normal).  This means that we have not identified a hereditary cause for her  family history of cancer at this time.   While reassuring, this does not definitively rule out a hereditary predisposition to cancer. It is still possible that there could be genetic mutations that are undetectable by current technology. There could be genetic mutations in genes that have not been tested or identified to increase cancer risk.  Therefore, it is recommended she continue to follow the cancer management and screening guidelines provided by her primary healthcare provider.   An individual's cancer risk and medical management are not determined by genetic test results alone. Overall cancer risk assessment incorporates additional factors, including personal medical history, family history, and any available genetic information that may result in a personalized plan for cancer prevention and surveillance.  RECOMMENDATIONS FOR FAMILY MEMBERS:  Relatives in this family might be at some increased risk of developing cancer, over the general population risk, simply due to the family history of cancer.  We recommended female relatives in this  family have a yearly mammogram beginning at age 1, or 57 years younger than the earliest onset of cancer, an annual clinical breast exam, and perform monthly breast self-exams. Female relatives in this family should also have a gynecological exam as recommended by their primary provider. All family members should have a colonoscopy by age 63, or as directed by their physicians.  It is also possible there is a hereditary cause for the cancer in Ms. Kattner's family that she did not inherit and therefore was not identified in her.  Based on Ms. Zellars's family history, we recommended her mother have genetic counseling and testing. Ms. Lamba will let us know if we can be of any assistance in coordinating genetic counseling and/or testing for these family members.  FOLLOW-UP: Lastly, we discussed with Ms. Tardiff that cancer genetics is a rapidly advancing field and it is possible that new genetic tests will be appropriate for her and/or her family members in the future. We encouraged her to remain in contact with cancer genetics on an annual basis so we can update her personal and family histories and let her know of advances in cancer genetics that may benefit this family.   Our contact number was provided. Ms. Fairfax questions were answered to her satisfaction, and she knows she is welcome  to call us at anytime with additional questions or concerns.   Faith Rogue, MS, [facility] Genetic Counselor [email or web address] Phone: [phone]

## 2018-12-15 NOTE — Progress Notes (Signed)
letter

## 2018-12-19 DIAGNOSIS — J3089 Other allergic rhinitis: Secondary | ICD-10-CM | POA: Diagnosis not present

## 2018-12-19 DIAGNOSIS — J301 Allergic rhinitis due to pollen: Secondary | ICD-10-CM | POA: Diagnosis not present

## 2018-12-19 DIAGNOSIS — J3081 Allergic rhinitis due to animal (cat) (dog) hair and dander: Secondary | ICD-10-CM | POA: Diagnosis not present

## 2018-12-25 DIAGNOSIS — J301 Allergic rhinitis due to pollen: Secondary | ICD-10-CM | POA: Diagnosis not present

## 2018-12-25 DIAGNOSIS — J3081 Allergic rhinitis due to animal (cat) (dog) hair and dander: Secondary | ICD-10-CM | POA: Diagnosis not present

## 2018-12-25 DIAGNOSIS — J3089 Other allergic rhinitis: Secondary | ICD-10-CM | POA: Diagnosis not present

## 2019-01-02 DIAGNOSIS — J301 Allergic rhinitis due to pollen: Secondary | ICD-10-CM | POA: Diagnosis not present

## 2019-01-02 DIAGNOSIS — J3089 Other allergic rhinitis: Secondary | ICD-10-CM | POA: Diagnosis not present

## 2019-01-02 DIAGNOSIS — J3081 Allergic rhinitis due to animal (cat) (dog) hair and dander: Secondary | ICD-10-CM | POA: Diagnosis not present

## 2019-01-09 DIAGNOSIS — J3089 Other allergic rhinitis: Secondary | ICD-10-CM | POA: Diagnosis not present

## 2019-01-09 DIAGNOSIS — J301 Allergic rhinitis due to pollen: Secondary | ICD-10-CM | POA: Diagnosis not present

## 2019-01-09 DIAGNOSIS — J3081 Allergic rhinitis due to animal (cat) (dog) hair and dander: Secondary | ICD-10-CM | POA: Diagnosis not present

## 2019-01-12 DIAGNOSIS — T7840XA Allergy, unspecified, initial encounter: Secondary | ICD-10-CM | POA: Diagnosis not present

## 2019-01-23 DIAGNOSIS — J3089 Other allergic rhinitis: Secondary | ICD-10-CM | POA: Diagnosis not present

## 2019-01-23 DIAGNOSIS — J3081 Allergic rhinitis due to animal (cat) (dog) hair and dander: Secondary | ICD-10-CM | POA: Diagnosis not present

## 2019-01-23 DIAGNOSIS — J301 Allergic rhinitis due to pollen: Secondary | ICD-10-CM | POA: Diagnosis not present

## 2019-01-30 DIAGNOSIS — J3081 Allergic rhinitis due to animal (cat) (dog) hair and dander: Secondary | ICD-10-CM | POA: Diagnosis not present

## 2019-01-30 DIAGNOSIS — J301 Allergic rhinitis due to pollen: Secondary | ICD-10-CM | POA: Diagnosis not present

## 2019-01-30 DIAGNOSIS — J3089 Other allergic rhinitis: Secondary | ICD-10-CM | POA: Diagnosis not present

## 2019-02-06 DIAGNOSIS — J3081 Allergic rhinitis due to animal (cat) (dog) hair and dander: Secondary | ICD-10-CM | POA: Diagnosis not present

## 2019-02-06 DIAGNOSIS — J301 Allergic rhinitis due to pollen: Secondary | ICD-10-CM | POA: Diagnosis not present

## 2019-02-06 DIAGNOSIS — J3089 Other allergic rhinitis: Secondary | ICD-10-CM | POA: Diagnosis not present

## 2019-02-11 DIAGNOSIS — J3089 Other allergic rhinitis: Secondary | ICD-10-CM | POA: Diagnosis not present

## 2019-02-11 DIAGNOSIS — J3081 Allergic rhinitis due to animal (cat) (dog) hair and dander: Secondary | ICD-10-CM | POA: Diagnosis not present

## 2019-02-11 DIAGNOSIS — J301 Allergic rhinitis due to pollen: Secondary | ICD-10-CM | POA: Diagnosis not present

## 2019-02-17 DIAGNOSIS — J301 Allergic rhinitis due to pollen: Secondary | ICD-10-CM | POA: Diagnosis not present

## 2019-02-17 DIAGNOSIS — J3081 Allergic rhinitis due to animal (cat) (dog) hair and dander: Secondary | ICD-10-CM | POA: Diagnosis not present

## 2019-02-19 DIAGNOSIS — J3081 Allergic rhinitis due to animal (cat) (dog) hair and dander: Secondary | ICD-10-CM | POA: Diagnosis not present

## 2019-02-19 DIAGNOSIS — J301 Allergic rhinitis due to pollen: Secondary | ICD-10-CM | POA: Diagnosis not present

## 2019-02-19 DIAGNOSIS — J3089 Other allergic rhinitis: Secondary | ICD-10-CM | POA: Diagnosis not present

## 2019-02-27 DIAGNOSIS — J3081 Allergic rhinitis due to animal (cat) (dog) hair and dander: Secondary | ICD-10-CM | POA: Diagnosis not present

## 2019-02-27 DIAGNOSIS — J3089 Other allergic rhinitis: Secondary | ICD-10-CM | POA: Diagnosis not present

## 2019-02-27 DIAGNOSIS — J301 Allergic rhinitis due to pollen: Secondary | ICD-10-CM | POA: Diagnosis not present

## 2019-03-03 DIAGNOSIS — J3081 Allergic rhinitis due to animal (cat) (dog) hair and dander: Secondary | ICD-10-CM | POA: Diagnosis not present

## 2019-03-03 DIAGNOSIS — J4540 Moderate persistent asthma, uncomplicated: Secondary | ICD-10-CM | POA: Diagnosis not present

## 2019-03-03 DIAGNOSIS — J3089 Other allergic rhinitis: Secondary | ICD-10-CM | POA: Diagnosis not present

## 2019-03-03 DIAGNOSIS — J301 Allergic rhinitis due to pollen: Secondary | ICD-10-CM | POA: Diagnosis not present

## 2019-03-04 DIAGNOSIS — J301 Allergic rhinitis due to pollen: Secondary | ICD-10-CM | POA: Diagnosis not present

## 2019-03-04 DIAGNOSIS — J3081 Allergic rhinitis due to animal (cat) (dog) hair and dander: Secondary | ICD-10-CM | POA: Diagnosis not present

## 2019-03-05 DIAGNOSIS — J3089 Other allergic rhinitis: Secondary | ICD-10-CM | POA: Diagnosis not present

## 2019-10-08 ENCOUNTER — Other Ambulatory Visit: Payer: Self-pay | Admitting: Allergy and Immunology

## 2019-10-08 ENCOUNTER — Ambulatory Visit
Admission: RE | Admit: 2019-10-08 | Discharge: 2019-10-08 | Disposition: A | Discharge status: Home / Self Care | Payer: 59 | Source: Ambulatory Visit | Attending: Allergy and Immunology | Admitting: Allergy and Immunology

## 2019-10-08 DIAGNOSIS — R059 Cough, unspecified: Secondary | ICD-10-CM

## 2019-10-23 ENCOUNTER — Ambulatory Visit
Admission: RE | Admit: 2019-10-23 | Discharge: 2019-10-23 | Disposition: A | Discharge status: Home / Self Care | Payer: 59 | Source: Ambulatory Visit | Attending: Allergy and Immunology | Admitting: Allergy and Immunology

## 2019-10-23 ENCOUNTER — Other Ambulatory Visit: Payer: Self-pay | Admitting: Allergy and Immunology

## 2019-10-23 DIAGNOSIS — J019 Acute sinusitis, unspecified: Secondary | ICD-10-CM

## 2021-01-17 ENCOUNTER — Other Ambulatory Visit: Payer: Self-pay | Admitting: Otolaryngology

## 2021-01-17 DIAGNOSIS — J329 Chronic sinusitis, unspecified: Secondary | ICD-10-CM

## 2021-01-19 ENCOUNTER — Ambulatory Visit: Payer: 59 | Admitting: Podiatry

## 2021-01-19 ENCOUNTER — Encounter: Payer: Self-pay | Admitting: Podiatry

## 2021-01-19 ENCOUNTER — Other Ambulatory Visit: Payer: Self-pay

## 2021-01-19 ENCOUNTER — Ambulatory Visit (INDEPENDENT_AMBULATORY_CARE_PROVIDER_SITE_OTHER): Payer: 59

## 2021-01-19 DIAGNOSIS — M2012 Hallux valgus (acquired), left foot: Secondary | ICD-10-CM

## 2021-01-19 DIAGNOSIS — K900 Celiac disease: Secondary | ICD-10-CM | POA: Insufficient documentation

## 2021-01-19 DIAGNOSIS — M19072 Primary osteoarthritis, left ankle and foot: Secondary | ICD-10-CM

## 2021-01-19 DIAGNOSIS — L501 Idiopathic urticaria: Secondary | ICD-10-CM | POA: Insufficient documentation

## 2021-01-19 DIAGNOSIS — M79672 Pain in left foot: Secondary | ICD-10-CM | POA: Diagnosis not present

## 2021-01-19 DIAGNOSIS — Z91018 Allergy to other foods: Secondary | ICD-10-CM | POA: Insufficient documentation

## 2021-01-19 DIAGNOSIS — J309 Allergic rhinitis, unspecified: Secondary | ICD-10-CM | POA: Insufficient documentation

## 2021-01-19 DIAGNOSIS — Z9109 Other allergy status, other than to drugs and biological substances: Secondary | ICD-10-CM | POA: Insufficient documentation

## 2021-01-19 DIAGNOSIS — J45909 Unspecified asthma, uncomplicated: Secondary | ICD-10-CM | POA: Insufficient documentation

## 2021-01-19 DIAGNOSIS — J4541 Moderate persistent asthma with (acute) exacerbation: Secondary | ICD-10-CM | POA: Insufficient documentation

## 2021-01-19 DIAGNOSIS — M25872 Other specified joint disorders, left ankle and foot: Secondary | ICD-10-CM | POA: Diagnosis not present

## 2021-01-19 DIAGNOSIS — F32A Depression, unspecified: Secondary | ICD-10-CM | POA: Insufficient documentation

## 2021-01-19 DIAGNOSIS — J301 Allergic rhinitis due to pollen: Secondary | ICD-10-CM | POA: Insufficient documentation

## 2021-01-19 DIAGNOSIS — Z8261 Family history of arthritis: Secondary | ICD-10-CM | POA: Insufficient documentation

## 2021-01-19 DIAGNOSIS — F419 Anxiety disorder, unspecified: Secondary | ICD-10-CM | POA: Insufficient documentation

## 2021-01-19 DIAGNOSIS — G8929 Other chronic pain: Secondary | ICD-10-CM

## 2021-01-19 DIAGNOSIS — J3081 Allergic rhinitis due to animal (cat) (dog) hair and dander: Secondary | ICD-10-CM | POA: Insufficient documentation

## 2021-01-19 DIAGNOSIS — M92219 Osteochondrosis (juvenile) of carpal lunate [Kienbock], unspecified hand: Secondary | ICD-10-CM | POA: Insufficient documentation

## 2021-01-19 NOTE — Progress Notes (Signed)
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 female presents with the above complaint.   ROS: Denies fever chills nausea vomiting muscle aches pains calf pain back pain chest pain shortness of breath.  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     Current Outpatient Medications:    fexofenadine (ALLEGRA) 180 MG tablet, Take 180 mg by mouth in the morning and at bedtime., Disp: , Rfl:    albuterol (ACCUNEB) 0.63 MG/3ML nebulizer solution, Take 1 ampule by nebulization every 6 (six) hours as needed for Wheezing., Disp: , Rfl:    azelastine (ASTELIN) 0.1 % nasal spray, Place into both nostrils., Disp: , Rfl:    BIOTIN PO, Take 500 mg by mouth daily., Disp: , Rfl:    budesonide-formoterol (SYMBICORT) 160-4.5 MCG/ACT inhaler, USE 2 PUFFS TWICE A DAY, Disp: , Rfl:    eletriptan (RELPAX) 40 MG tablet, Take 40 mg by mouth as needed for migraine or headache. May repeat in 2 hours if headache persists or recurs., Disp: , Rfl:    EPINEPHrine 0.3 mg/0.3 mL IJ SOAJ injection, See admin instructions., Disp: , Rfl: 0   ethynodiol-ethinyl estradiol (KELNOR 1/35) 1-35 MG-MCG tablet, , Disp: , Rfl:    FLUoxetine (PROZAC) 20 MG capsule, Take 20 mg by mouth every morning., Disp: , Rfl: 2   montelukast (SINGULAIR) 10 MG tablet, Take 10 mg by mouth at bedtime., Disp: , Rfl:     Multiple Vitamin (MULTIVITAMIN) capsule, Take 1 capsule by mouth daily., Disp: , Rfl:    omeprazole (PRILOSEC) 20 MG capsule, Take 20 mg by mouth daily., Disp: , Rfl:    ranitidine (ZANTAC) 150 MG tablet, Take 150 mg by mouth 2 (two) times daily., Disp: , Rfl: 1   SPIRIVA RESPIMAT 1.25 MCG/ACT AERS, SMARTSIG:2 Puff(s) Via Inhaler Daily, Disp: , Rfl:    tacrolimus (PROTOPIC) 0.03 % ointment, Apply 1 application topically 2 (two) times daily as needed., Disp: , Rfl:    triamcinolone (NASACORT ALLERGY 24HR) 55 MCG/ACT AERO nasal inhaler, Place 2 sprays into the nose daily., Disp: , Rfl:    UBRELVY 100 MG TABS, Take by mouth., Disp: , Rfl:   Allergies  Allergen Reactions   Cefaclor     REACTION: Hives   Peanuts [Peanut Oil]    Prednisone     Other reaction(s): tachycardia   Soy Allergy    Coconut Oil Dermatitis, Hives and Rash   Latex Hives and Rash   Review of Systems Objective:  There were no vitals filed for this visit.  General: Well developed, nourished, in no acute distress, alert and oriented x3   Dermatological: Skin is warm, dry and supple bilateral. Nails x 10 are  well maintained; remaining integument appears unremarkable at this time. There are no open sores, no preulcerative lesions, no rash or signs of infection present.  Vascular: Dorsalis Pedis artery and Posterior Tibial artery pedal pulses are 2/4 bilateral with immedate capillary fill time. Pedal hair growth present. No varicosities and no lower extremity edema present bilateral.   Neruologic: Grossly intact via light touch bilateral. Vibratory intact via tuning fork bilateral. Protective threshold with Semmes Wienstein monofilament intact to all pedal sites bilateral. Patellar and Achilles deep tendon reflexes 2+ bilateral. No Babinski or clonus noted bilateral.   Musculoskeletal: No gross boney pedal deformities bilateral. No pain, crepitus, or limitation noted with foot and ankle range of motion bilateral. Muscular  strength 5/5 in all groups tested bilateral.  Pain on palpation of the first metatarsophalangeal joint left foot.  Easily reducible first metatarsal hallux valgus deformity.  She does have some tenderness on plantarflexion of the first metatarsal phalangeal joint.  She has pain on palpation of the sesamoids.  Gait: Unassisted, Nonantalgic.    Radiographs:  Radiographs left foot taken today demonstrate an osseously mature individual with increased in the first intermetatarsal angle greater than normal value.  Hallux abductus angle greater than normal value with tibial sesamoid position of greater than 4 early dislocation at the level of the first metatarsophalangeal joint.  Some dorsal spurring at the talonavicular joint but insignificant.  No osseous lesions identified.  No acute findings noted.  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  Plan: Discussed in detail today possible need for surgical options we did discuss the etiology pathology conservative versus surgical therapies we did discuss injection therapy as well.  She is leaning more toward a surgical fix at this point so we need to request an MRI of the first metatarsophalangeal joint for soft tissue evaluation.  Differential diagnoses and patient     Cameren Earnest T. [place], [place]

## 2021-01-23 ENCOUNTER — Encounter: Payer: Self-pay | Admitting: Podiatry

## 2021-01-24 ENCOUNTER — Other Ambulatory Visit: Payer: Self-pay

## 2021-01-24 ENCOUNTER — Ambulatory Visit
Admission: RE | Admit: 2021-01-24 | Discharge: 2021-01-24 | Disposition: A | Discharge status: Home / Self Care | Payer: 59 | Source: Ambulatory Visit | Attending: Otolaryngology | Admitting: Otolaryngology

## 2021-01-24 DIAGNOSIS — J329 Chronic sinusitis, unspecified: Secondary | ICD-10-CM

## 2021-02-05 ENCOUNTER — Other Ambulatory Visit: Payer: Self-pay

## 2021-02-05 ENCOUNTER — Ambulatory Visit
Admission: RE | Admit: 2021-02-05 | Discharge: 2021-02-05 | Disposition: A | Discharge status: Home / Self Care | Payer: 59 | Source: Ambulatory Visit | Attending: Podiatry | Admitting: Podiatry

## 2021-02-05 DIAGNOSIS — M25872 Other specified joint disorders, left ankle and foot: Secondary | ICD-10-CM

## 2021-02-05 DIAGNOSIS — M19072 Primary osteoarthritis, left ankle and foot: Secondary | ICD-10-CM

## 2021-02-05 DIAGNOSIS — G8929 Other chronic pain: Secondary | ICD-10-CM

## 2021-02-14 ENCOUNTER — Encounter: Payer: Self-pay | Admitting: Podiatry

## 2021-02-14 ENCOUNTER — Ambulatory Visit: Payer: 59 | Admitting: Podiatry

## 2021-02-14 ENCOUNTER — Other Ambulatory Visit: Payer: Self-pay

## 2021-02-14 DIAGNOSIS — M2012 Hallux valgus (acquired), left foot: Secondary | ICD-10-CM | POA: Diagnosis not present

## 2021-02-15 NOTE — Progress Notes (Signed)
She presents today for follow-up of her MRI.  She states that the foot seems to be getting worse.  Objective: Vital signs are stable she is alert and oriented x3.  Pulses are palpable.  Neurologic sensorium is intact Deetjen reflexes are intact muscle strength is normal symmetrical.  She has pain on range of motion of the first metatarsophalangeal joint left foot.  MRI did demonstrate hallux valgus deformity with some bursitis.  Assessment: Hallux abductovalgus deformity.  Plan: Discussed etiology pathology conservative surgical therapies.  We discussed alternative therapies such as injection therapy.  She would like to consider surgical therapy to correct the deformity at this time.  We consented her today for an [facility] bunion repair with screw she understands this and is amendable to it.  We did discuss the possible postop complications which may include but not limited to postop pain bleeding swelling infection recurrence need further surgery overcorrection under correction also digit loss of limb loss of life.  She was given information regarding the surgery center provided a cam boot and I will follow-up with her in the near future for surgical intervention.

## 2021-02-23 ENCOUNTER — Other Ambulatory Visit: Payer: Self-pay | Admitting: Podiatry

## 2021-02-23 MED ORDER — TRAMADOL HCL 50 MG PO TABS: 50.0000 mg | ORAL_TABLET | Freq: Three times a day (TID) | ORAL | 0 refills | Status: AC | PRN

## 2021-02-23 MED ORDER — CLINDAMYCIN HCL 150 MG PO CAPS: 150.0000 mg | ORAL_CAPSULE | Freq: Three times a day (TID) | ORAL | 0 refills | Status: DC

## 2021-02-23 MED ORDER — ONDANSETRON HCL 4 MG PO TABS: 4.0000 mg | ORAL_TABLET | Freq: Three times a day (TID) | ORAL | 0 refills | Status: DC | PRN

## 2021-02-23 NOTE — Progress Notes (Unsigned)
tram

## 2021-02-24 DIAGNOSIS — M2012 Hallux valgus (acquired), left foot: Secondary | ICD-10-CM

## 2021-03-02 ENCOUNTER — Encounter: Payer: Self-pay | Admitting: Podiatry

## 2021-03-02 ENCOUNTER — Other Ambulatory Visit: Payer: Self-pay

## 2021-03-02 ENCOUNTER — Ambulatory Visit (INDEPENDENT_AMBULATORY_CARE_PROVIDER_SITE_OTHER): Payer: 59 | Admitting: Podiatry

## 2021-03-02 ENCOUNTER — Ambulatory Visit (INDEPENDENT_AMBULATORY_CARE_PROVIDER_SITE_OTHER): Payer: 59

## 2021-03-02 DIAGNOSIS — Z9889 Other specified postprocedural states: Secondary | ICD-10-CM

## 2021-03-02 DIAGNOSIS — M2012 Hallux valgus (acquired), left foot: Secondary | ICD-10-CM

## 2021-03-02 MED ORDER — TRAMADOL HCL 50 MG PO TABS: 50.0000 mg | ORAL_TABLET | Freq: Three times a day (TID) | ORAL | 0 refills | Status: AC | PRN

## 2021-03-02 NOTE — Progress Notes (Signed)
She presents today for her first postop visit date of surgery 02/24/2021 [facility] bunionectomy left denies fever chills nausea vomiting muscle aches pain states that she is just felt really weak and tired.  Objective: Vital signs are stable alert and oriented x3 there is no erythema mild ecchymosis mild edema no cellulitis drainage or odor she has good range of motion active and passive of the first metatarsophalangeal joint left foot.  Radiographs taken today demonstrate a capital osteotomy head of the first metatarsal of the left foot.  Internal fixation is a single screw and is intact and appears to be compressed.  Assessment: Well-healing surgical foot x1 week.  Plan: Redressed today dressed a compressive dressing continue to elevate increased range of motion activities encouraged range of motion activities.  I like to follow-up with her in 1 week.

## 2021-03-09 ENCOUNTER — Other Ambulatory Visit: Payer: Self-pay

## 2021-03-09 ENCOUNTER — Ambulatory Visit (INDEPENDENT_AMBULATORY_CARE_PROVIDER_SITE_OTHER): Payer: 59 | Admitting: Podiatry

## 2021-03-09 ENCOUNTER — Encounter: Payer: Self-pay | Admitting: Podiatry

## 2021-03-09 DIAGNOSIS — M2012 Hallux valgus (acquired), left foot: Secondary | ICD-10-CM | POA: Diagnosis not present

## 2021-03-09 DIAGNOSIS — Z9889 Other specified postprocedural states: Secondary | ICD-10-CM

## 2021-03-09 NOTE — Progress Notes (Signed)
She presents today with her father for a second postop visit date of surgery 02/24/2021 [facility] bunionectomy left.  She states that is deftly been better this week.  Objective: Vital signs are stable alert and oriented x3.  Pulses are palpable.  There is no erythema minimal edema no cellulitis drainage or odor she still has stiffness on attempted range of motion of the first metatarsophalangeal joint both passively as well as actively.  Assessment: Well-healing surgical foot.  Plan: Encourage range of motion exercises.  Placed her in a compression anklet and a Darco shoe and I would like to follow-up with her in 2 to 3 weeks.  I will allow her to start washing this

## 2021-03-23 ENCOUNTER — Encounter: Payer: Self-pay | Admitting: Podiatry

## 2021-03-23 ENCOUNTER — Ambulatory Visit (INDEPENDENT_AMBULATORY_CARE_PROVIDER_SITE_OTHER): Payer: 59

## 2021-03-23 ENCOUNTER — Ambulatory Visit (INDEPENDENT_AMBULATORY_CARE_PROVIDER_SITE_OTHER): Payer: 59 | Admitting: Podiatry

## 2021-03-23 ENCOUNTER — Other Ambulatory Visit: Payer: Self-pay

## 2021-03-23 DIAGNOSIS — M2012 Hallux valgus (acquired), left foot: Secondary | ICD-10-CM | POA: Diagnosis not present

## 2021-03-23 DIAGNOSIS — Z9889 Other specified postprocedural states: Secondary | ICD-10-CM

## 2021-03-23 NOTE — Progress Notes (Signed)
Pamela Norton presents today for postop visit date of surgery is 02/24/2021 Pamela Norton bunionectomy left she states that is feeling a little better each day.  Objective: Vital signs are stable she alert oriented x3 she has better range of motion today that she did previously though still makes her a bit nauseous for me to move the toe.  Active and passive range of motion has improved the decrease in soft tissue swelling is noted not only physical exam but on radiograph lateral.  Radiographically her osteotomy is healing nicely internal fixation is in good position.  Assessment: Well-healing surgical foot.  Plan: Encourage range of motion exercises unguinal recommend she continue use of the Darco shoe she is welcome to try a tennis shoe the week after next if there is still painful she is can go to continue her Darco shoe.  I will follow-up with her at that time for another set of x-rays.

## 2021-04-06 ENCOUNTER — Encounter: Payer: Self-pay | Admitting: Podiatry

## 2021-04-06 ENCOUNTER — Other Ambulatory Visit: Payer: Self-pay

## 2021-04-06 ENCOUNTER — Ambulatory Visit (INDEPENDENT_AMBULATORY_CARE_PROVIDER_SITE_OTHER): Payer: 59 | Admitting: Podiatry

## 2021-04-06 ENCOUNTER — Ambulatory Visit (INDEPENDENT_AMBULATORY_CARE_PROVIDER_SITE_OTHER): Payer: 59

## 2021-04-06 DIAGNOSIS — F339 Major depressive disorder, recurrent, unspecified: Secondary | ICD-10-CM | POA: Insufficient documentation

## 2021-04-06 DIAGNOSIS — M2012 Hallux valgus (acquired), left foot: Secondary | ICD-10-CM

## 2021-04-06 DIAGNOSIS — Z9889 Other specified postprocedural states: Secondary | ICD-10-CM

## 2021-04-06 DIAGNOSIS — R519 Headache, unspecified: Secondary | ICD-10-CM | POA: Insufficient documentation

## 2021-04-06 NOTE — Progress Notes (Signed)
She presents today date of surgery 02/24/2021 [facility] bunionectomy left foot states that it still puffy sometimes and a little bit tender mass fifth toe still little bit numb.  Objective: Vital signs are stable she is alert oriented x3 she is got great range of motion of the first metatarsophalangeal joint passively as well as actively.  She has some allodynic type pain across the dorsum of the foot and around the fifth toe area appears to be a bit more than likely this is associated with the thigh block however it could be something as simple as the compression hose as well.  Radiographs taken today demonstrate well-healing osteotomy with internal fixation in good position alignment of the joint is in perfect position and edema is coming down very nicely.  Assessment: Well-healed surgical foot.  Plan: Encouraged her to try a tennis shoe splint particularly first thing in the morning and get back and forth between the tennis shoe and her Darco shoe.  I also encouraged contrast baths as well as massage therapy particularly for continue tenderness.  I explained to her that it may take a while for the numbness to subside.  I would like to see her in a tennis shoe by the next time I see her which will be in 3 weeks.

## 2021-05-02 ENCOUNTER — Other Ambulatory Visit: Payer: Self-pay

## 2021-05-02 ENCOUNTER — Ambulatory Visit (INDEPENDENT_AMBULATORY_CARE_PROVIDER_SITE_OTHER): Payer: 59 | Admitting: Podiatry

## 2021-05-02 ENCOUNTER — Ambulatory Visit (INDEPENDENT_AMBULATORY_CARE_PROVIDER_SITE_OTHER): Payer: 59

## 2021-05-02 ENCOUNTER — Encounter: Payer: Self-pay | Admitting: Podiatry

## 2021-05-02 DIAGNOSIS — Z9889 Other specified postprocedural states: Secondary | ICD-10-CM

## 2021-05-02 DIAGNOSIS — M2012 Hallux valgus (acquired), left foot: Secondary | ICD-10-CM | POA: Diagnosis not present

## 2021-05-02 NOTE — Progress Notes (Signed)
She presents with her father today for postop visit date of surgery is 02/24/2021 Pamela Norton bunionectomy left she states that he is feeling much better though she feels like she is hit a plateau as far as his range of motion and the ability to increase her activity.  She denies any trauma and states that she has been walking for about 2 blocks and that is about all she can do.  Objective: Vital signs are stable alert and oriented x3 there is no erythema edema cellulitis drainage odor she is got good range of motion plantarflexion passively as well as actively dorsiflexion is somewhat limited passively and actively.  Radiographs taken today demonstrate well healing osteotomy internal fixation is unchanged from previous evaluations  Assessment: Well-healing surgical foot limited range of motion dorsiflexion.  Plan: At this point I encouraged her to continue her at home physical therapy but we are going to increase her range of motion with formal physical therapy at [facility] I will follow-up with her in 1 month

## 2021-05-09 ENCOUNTER — Encounter: Payer: Self-pay | Admitting: Podiatry

## 2021-05-10 ENCOUNTER — Telehealth: Payer: Self-pay | Admitting: *Deleted

## 2021-05-10 NOTE — Telephone Encounter (Signed)
Faxed referral to [facility] -05/10/21.

## 2021-05-10 NOTE — Telephone Encounter (Signed)
-----   Message from Kristian Covey, [facility] sent at 05/03/2021 11:48 AM EST ----- Sharyne Peach!!  This patient needs PT at [facility]  I am in [place] and don't have any [facility] forms.  Will you fax an order over for her to that location - this will be for post bunionectomy-work on strengthening, ROM for 1st MPJ, decrease pain - her DOS 02/24/21 left foot

## 2021-05-30 ENCOUNTER — Ambulatory Visit (INDEPENDENT_AMBULATORY_CARE_PROVIDER_SITE_OTHER): Payer: 59 | Admitting: Podiatry

## 2021-05-30 ENCOUNTER — Ambulatory Visit (INDEPENDENT_AMBULATORY_CARE_PROVIDER_SITE_OTHER): Payer: 59

## 2021-05-30 ENCOUNTER — Other Ambulatory Visit: Payer: Self-pay

## 2021-05-30 DIAGNOSIS — M2012 Hallux valgus (acquired), left foot: Secondary | ICD-10-CM | POA: Diagnosis not present

## 2021-05-30 DIAGNOSIS — Z9889 Other specified postprocedural states: Secondary | ICD-10-CM

## 2021-05-30 NOTE — Progress Notes (Signed)
Pamela Norton presents with her father Dr. Omar Person today for a follow-up visit date of surgery 02/24/2021 [facility] bunionectomy left.  She has been going to physical therapy and states that is doing much better since that time.  Still has some times that her gait is not as good as it was but she feels that she is improving nonetheless.  Still states that she has some allodynic type pains across the dorsum of the foot and sometimes radiates up the anterior leg but agrees that that may be back related. ? ?Objective: Vital signs are stable she is alert and oriented x3 she has great range of motion of the first metatarsophalangeal joint. ? ?Radiographs taken today demonstrate internal fixation first metatarsal is intact 100% osseous healing at this point. ? ?Assessment: Well-healing surgical foot. ? ?Plan: We will request that she continue physical therapy just to get over the desensitization part of the gait that this causes.  I will follow-up with her in about a month or so or when she is completed physical therapy. ?

## 2021-06-27 ENCOUNTER — Ambulatory Visit (INDEPENDENT_AMBULATORY_CARE_PROVIDER_SITE_OTHER): Payer: 59 | Admitting: Podiatry

## 2021-06-27 ENCOUNTER — Ambulatory Visit (INDEPENDENT_AMBULATORY_CARE_PROVIDER_SITE_OTHER): Payer: 59

## 2021-06-27 DIAGNOSIS — M2012 Hallux valgus (acquired), left foot: Secondary | ICD-10-CM | POA: Diagnosis not present

## 2021-06-27 NOTE — Progress Notes (Signed)
Pamela Norton presents today date of surgery 02/24/2021 [facility] bunionectomy left.  She states that there is still some discomfort particularly when she overdoes it.  She states that she feels that she is still weak and has not have her endurance up yet and would like to continue physical therapy since it seems to be helping dramatically. ? ?Objective: Vital signs are stable she is alert and oriented x3.  Pulses are palpable.  Neurologic sensorium is intact Deetjen reflexes are intact muscle strength is normal symmetrical.  She has good range of motion approximately 45 to 50 degrees of dorsiflexion about 10 degrees of plantarflexion.  Radiographically it appears to be healed nearly 100%.  Internal fixation appears to be intact. ? ?Assessment: Well-healing surgical foot I would like for her endurance and her ability to perform a normal heel-to-toe gait to improve. ? ?Plan: Requesting further physical therapy to improve gait and strengthening and range of motion.  Follow-up with me in about 2 months. ?

## 2021-06-30 ENCOUNTER — Encounter: Payer: Self-pay | Admitting: Podiatry

## 2021-07-08 ENCOUNTER — Encounter: Payer: Self-pay | Admitting: Podiatry

## 2021-07-18 ENCOUNTER — Telehealth: Payer: Self-pay | Admitting: Podiatry

## 2021-07-18 NOTE — Telephone Encounter (Signed)
Pt called back because she had not heard anything from the my chart message. I read the message and she is scheduled to see Dr Al Corpus on 5.16.2023... ?

## 2021-07-19 ENCOUNTER — Encounter: Payer: Self-pay | Admitting: Neurology

## 2021-07-19 ENCOUNTER — Ambulatory Visit: Payer: 59 | Admitting: Neurology

## 2021-07-19 ENCOUNTER — Other Ambulatory Visit: Payer: Self-pay | Admitting: Neurology

## 2021-07-19 VITALS — BP 133/82 | HR 118 | Ht 62.0 in | Wt 141.0 lb

## 2021-07-19 DIAGNOSIS — G43109 Migraine with aura, not intractable, without status migrainosus: Secondary | ICD-10-CM | POA: Diagnosis not present

## 2021-07-19 MED ORDER — TOPIRAMATE 50 MG PO TABS: 50.0000 mg | ORAL_TABLET | Freq: Every evening | ORAL | 6 refills | Status: DC

## 2021-07-19 MED ORDER — NURTEC 75 MG PO TBDP: 75.0000 mg | ORAL_TABLET | Freq: Every day | ORAL | 6 refills | Status: DC | PRN

## 2021-07-19 NOTE — Progress Notes (Signed)
?[facility] ? ? ? ?Provider:  Dr Lucia GaskinsAhern ?Requesting Provider: Irena Reichmannollins, Dana, DO ?Primary Care Provider:  Irena Reichmannollins, Dana, DO ? ?CC:  migraines ? ?HPI:  Pamela Norton is a 31 y.o. female here as requested by Irena Reichmannollins, Dana, DO for migraines. PMHx migraine headaches, asthma, celiac disease, depression, idiopathic hypersomnia.  I reviewed Dr. Thomasena Edisollins notes, [facility] per patient's request for worsening migraine headaches, but she is [facility].  She also has anxiety.  Hemoglobin A1c in June 2022 was 5.6, thyroid was 5.01 which was elevated but free T4 was normal, LDL was 131 which was elevated, CBC was normal, CMP was unremarkable with BUN 9 and creatinine 0.68, physical examination was unremarkable, she has been started on Ubrelvy in the past for her migraines and given samples, it appears she is also had Relpax on medication list, she has a history of migraines usually 1/month increased to 3-4, unknown triggers, Relpax not helping as much, described as throbbing sharp pain around the entire head, some nausea, no vision changes or vomiting, positive photophobia.  More recent labs include a normal free T4 all collected in February 2023, normal TSH, negative thyroid antibody panel. ? ?She has had migraines since HS since 12-15, mom has migraines and maternal father also had migraine. No visual aura but sometimes has some speech changes, usually this happens before the migraine 1-2, not every time but recurs in the exact same manner with multiple migraines had it at least with 5 migraines many more.She has never been able to pinpoint a trigger. It starts as a normal headahe and progresses. She may wake up with one. Pain is behind the eyes, sharp, throbbing/pulsating, +phono/photophobia, nausea, diarrhea, moderate to severe, occ vomiting, movement makes them worse, can last 24 hours. Not posotional but after she  exercises she can have a headache ongoing 3-4 years. She did have an MRi in the past and it was negative. She has a lot of food allergies and celiac disease. For the last 2 years she gets 2 a week, 8 migraine days a month and > 15headache days a month (headaches+migraines). Relpax did not work. Bernita RaisinUbrelvy worked for a while and doesn't help. No other focal neurologic deficits, associated symptoms, inciting events or modifiable factors. ? ? ?Propranolol contraindicated due to asthma, never been on topamax/topiramate, been on multiple depression medications prozac, amitriptyline(side effects could not tolerate), wellbutrin, celexa, lexapro, imitrex, relpax, aimovig contraindicated due to celiac disease and constipation.  ? ?Reviewed notes, labs and imaging from outside physicians, which showed:  ? ?CT Maillofacial 11/22: FINDINGS: ?Paranasal sinuses: ?  ?Frontal: Normally aerated. Patent frontal sinus drainage pathways. ?  ?Ethmoid: Normally aerated. ?  ?Maxillary: Normally aerated. ?  ?Sphenoid: Normally aerated. Patent sphenoethmoidal recesses. ?  ?Right ostiomeatal unit: Patent. ?  ?Left ostiomeatal unit: Patent. ?  ?Nasal passages: The nasal septum is intact and now midline. 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 Asymmetric ?prominence of the left middle and inferior nasal turbinates, which ?may be at least partially due to the nasal cycle. Background minimal ?mucosal thickening within the bilateral nasal passages. Left middle ?turbinate concha bullosa. ?  ?Anatomy: Pneumatization is present superior to the anterior ethmoid ?notches bilaterally. Symmetric and intact olfactory grooves and ?fovea ethmoidalis, Keros II (4-677mm). Sellar sphenoid pneumatization ?pattern. ?  ?IMPRESSION: ?Normally aerated paranasal sinuses. Patent sinus drainage pathways. ?  ?The nasal septum is now midline, compatible with interval ?septoplasty. ?  ?  Additionally, there are postoperative changes from interval  partial ?turbinectomy. ?  ?Asymmetric prominence of the left middle and inferior nasal ?turbinates, which may be at least partly due to the nasal cycle. ?  ?Background minimal mucosal thickening within the bilateral nasal ?passages. ?  ?Left middle turbinate concha bullosa. ?  ? ?Review of Systems: ?Patient complains of symptoms per HPI as well as the following symptoms migraines, constipation. Pertinent negatives and positives per HPI. All others negative. ? ? ?Social History  ? ?Socioeconomic History  ? Marital status: Single  ?  Spouse name: Not on file  ? Number of children: Not on file  ? Years of education: Not on file  ? Highest education level: Not on file  ?Occupational History  ? Not on file  ?Tobacco Use  ? Smoking status: Never  ? Smokeless tobacco: Never  ?Substance and Sexual Activity  ? Alcohol use: Yes  ?  Alcohol/week: 1.0 standard drink  ?  Types: 1 Cans of beer per week  ? Drug use: No  ? Sexual activity: Never  ?  Birth control/protection: Pill  ?Other Topics Concern  ? Not on file  ?Social History Narrative  ? Not on file  ? ?Social Determinants of Health  ? ?Financial Resource Strain: Not on file  ?Food Insecurity: Not on file  ?Transportation Needs: Not on file  ?Physical Activity: Not on file  ?Stress: Not on file  ?Social Connections: Not on file  ?Intimate Partner Violence: Not on file  ? ? ?Family History  ?Problem Relation Age of Onset  ? Thyroid disease Mother   ? Hypothyroidism Mother   ? Osteoarthritis Mother   ? Fibromyalgia Mother   ? Restless legs syndrome Mother   ? Thyroid cancer Mother 73  ?     Papillary  ? Melanoma Mother 78  ? Colon polyps Mother   ?     cscope every 3 years  ? Migraines Mother   ? Colon polyps Father   ? Colon cancer Maternal Grandmother 15  ? Kidney cancer Maternal Grandfather 83  ? Dementia Paternal Grandmother   ? Throat cancer Paternal Grandfather   ? Melanoma Paternal Grandfather   ? Breast cancer Other   ? ? ?Past Medical History:  ?Diagnosis Date  ?  Anxiety   ? Asthma   ? Celiac disease   ? Depression   ? Family history of breast cancer   ? Family history of colon cancer   ? Family history of colonic polyps   ? Family history of melanoma   ? Family history of thyroid cancer   ? Migraines   ? ? ?Patient Active Problem List  ? Diagnosis Date Noted  ? Headache 04/06/2021  ? 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 Allergic rhinitis 01/19/2021  ? Allergic rhinitis due to animal (cat) (dog) hair and dander 01/19/2021  ? Allergic rhinitis due to pollen 01/19/2021  ? Anxiety 01/19/2021  ? Asthma 01/19/2021  ? Celiac disease 01/19/2021  ? Depression 01/19/2021  ? Environmental allergies 01/19/2021  ? Family history of arthritis 01/19/2021  ? Food allergy 01/19/2021  ? Idiopathic urticaria 01/19/2021  ? 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 Moderate persistent asthma with (acute) exacerbation 01/19/2021  ? Genetic testing 12/12/2018  ? Family history of colon cancer   ? Family history of thyroid cancer   ? Family history of breast cancer   ? Family history of melanoma   ? Family history of colonic polyps   ? Idiopathic hypersomnia  10/17/2017  ? Radial styloid tenosynovitis 02/22/2015  ? MIGRAINE HEADACHE 01/07/2008  ? ASTHMA 09/26/2006  ? ? ?Past Surgical History:  ?Procedure Laterality Date  ? WISDOM TOOTH EXTRACTION    ? WRIST SURGERY Left   ? 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   ? ? ?Current Outpatient Medications  ?Medication Sig Dispense Refill  ? albuterol (ACCUNEB) 0.63 MG/3ML nebulizer solution Take 1 ampule by nebulization every 6 (six) hours as needed for Wheezing.    ? azelastine (ASTELIN) 0.1 % nasal spray Place into both nostrils.    ? Baclofen 20 MG/20ML SOLN     ? budesonide-formoterol (SYMBICORT) 160-4.5 MCG/ACT inhaler USE 2 PUFFS TWICE A DAY    ? EPINEPHrine 0.3 mg/0.3 mL IJ SOAJ injection See admin instructions.  0  ? ethynodiol-ethinyl estradiol (ZOVIA) 1-35 MG-MCG tablet     ? fexofenadine (ALLEGRA) 180 MG tablet Take 180 mg by mouth  in the morning and at bedtime.    ? FLUoxetine (PROZAC) 20 MG capsule Take 20 mg by mouth every morning.  2  ? levocetirizine (XYZAL) 5 MG tablet Take 5 mg by mouth daily.    ? montelukast (SINGULAIR) 10 MG tablet Ta

## 2021-07-19 NOTE — Patient Instructions (Addendum)
Prevention: Topiramate 50mg  at bedtime. IF fail this or have side effects, Start either Ajovy or Emgality (they are both same class of medication, I use them interchangeably, depends on insurance. Would not start Aimovig). Also love Botox for migraines.  ?Acutely: As needed, medications may not work well until more well controlled on apreventative. Wouldn't hesitate to retry Ubrelvy at a later, but we can try Nurtec.  ?Could consider MRI brain, at this time will hold off ? ?There is increased risk for stroke in women with migraine with aura and a contraindication for the combined contraceptive pill for use by women who have migraine with aura. The risk for women with migraine without aura is lower. However other risk factors like smoking are far more likely to increase stroke risk than migraine. There is a recommendation for no smoking and for the use of OCPs without estrogen such as progestogen only pills particularly for women with migraine with aura. People who have migraine headaches with auras may be 3 times more likely to have a stroke caused by a blood clot, compared to migraine patients who don't see auras. Women who take hormone-replacement therapy may be 30 percent more likely to suffer a clot-based stroke than women not taking medication containing estrogen. Other risk factors like smoking and high blood pressure may be  much more important. ? ?Fremanezumab Injection ?What is this medication? ?FREMANEZUMAB (fre ma NEZ ue mab) prevents migraines. It works by blocking a substance in the body that causes migraines. It is a monoclonal antibody. ?This medicine may be used for other purposes; ask your health care provider or pharmacist if you have questions. ?COMMON BRAND NAME(S): AJOVY ?What should I tell my care team before I take this medication? ?They need to know if you have any of these conditions: ?An unusual or allergic reaction to fremanezumab, other medications, foods, dyes, or  preservatives ?Pregnant or trying to get pregnant ?Breast-feeding ?How should I use this medication? ?This medication is injected under the skin. You will be taught how to prepare and give it. Take it as directed on the prescription label. Keep taking it unless your care team tells you to stop. ?It is important that you put your used needles and syringes in a special sharps container. Do not put them in a trash can. If you do not have a sharps container, call your pharmacist or care team to get one. ?Talk to your care team about the use of this medication in children. Special care may be needed. ?Overdosage: If you think you have taken too much of this medicine contact a poison control center or emergency room at once. ?NOTE: This medicine is only for you. Do not share this medicine with others. ?What if I miss a dose? ?If you miss a dose, take it as soon as you can. If it is almost time for your next dose, take only that dose. Do not take double or extra doses. ?What may interact with this medication? ?Interactions are not expected. ?This list may not describe all possible interactions. Give your health care provider a list of all the medicines, herbs, non-prescription drugs, or dietary supplements you use. Also tell them if you smoke, drink alcohol, or use illegal drugs. Some items may interact with your medicine. ?What should I watch for while using this medication? ?Tell your care team if your symptoms do not start to get better or if they get worse. ?What side effects may I notice from receiving this medication? ?Side effects that  you should report to your care team as soon as possible: ?Allergic reactions or angioedema--skin rash, itching or hives, swelling of the face, eyes, lips, tongue, arms, or legs, trouble swallowing or breathing ?Side effects that usually do not require medical attention (report to your care team if they continue or are bothersome): ?Pain, redness, or irritation at injection site ?This  list may not describe all possible side effects. Call your doctor for medical advice about side effects. You may report side effects to FDA at 1-800-FDA-1088. ?Where should I keep my medication? ?Keep out of the reach of children and pets. ?Store in a refrigerator or at room temperature between 20 and 25 degrees C (68 and 77 degrees F). ?Refrigeration (preferred): Store in the refrigerator. Do not freeze. Keep in the original container until you are ready to take it. Remove the dose from the carton about 30 minutes before it is time for you to use it. If the dose is not used, it may be stored in the original container at room temperature for 7 days. Get rid of any unused medication after the expiration date. ?Room Temperature: This medication may be stored at room temperature for up to 7 days. Keep it in the original container. Protect from light until time of use. If it is stored at room temperature, get rid of any unused medication after 7 days or after it expires, whichever is first. ?To get rid of medications that are no longer needed or have expired: ?Take the medication to a medication take-back program. Check with your pharmacy or law enforcement to find a location. ?If you cannot return the medication, ask your pharmacist or care team how to get rid of this medication safely. ?NOTE: This sheet is a summary. It may not cover all possible information. If you have questions about this medicine, talk to your doctor, pharmacist, or health care provider. ?? 2023 Elsevier/Gold Standard (2021-04-28 00:00:00) ?Topiramate Tablets ?What is this medication? ?TOPIRAMATE (toe PYRE a mate) prevents and controls seizures in people with epilepsy. It may also be used to prevent migraine headaches. It works by calming overactive nerves in your body. ?This medicine may be used for other purposes; ask your health care provider or pharmacist if you have questions. ?COMMON BRAND NAME(S): Topamax, Topiragen ?What should I tell my  care team before I take this medication? ?They need to know if you have any of these conditions: ?Bleeding disorder ?Kidney disease ?Lung disease ?Suicidal thoughts, plans or attempt ?An unusual or allergic reaction to topiramate, other medications, foods, dyes, or preservatives ?Pregnant or trying to get pregnant ?Breast-feeding ?How should I use this medication? ?Take this medication by mouth with water. Take it as directed on the prescription label at the same time every day. Do not cut, crush or chew this medicine. Swallow the tablets whole. You can take it with or without food. If it upsets your stomach, take it with food. Keep taking it unless your care team tells you to stop. ?A special MedGuide will be given to you by the pharmacist with each prescription and refill. Be sure to read this information carefully each time. ?Talk to your care team about the use of this medication in children. While it may be prescribed for children as young as 2 years for selected conditions, precautions do apply. ?Overdosage: If you think you have taken too much of this medicine contact a poison control center or emergency room at once. ?NOTE: This medicine is only for you. Do not share  this medicine with others. ?What if I miss a dose? ?If you miss a dose, take it as soon as you can unless it is within 6 hours of the next dose. If it is within 6 hours of the next dose, skip the missed dose. Take the next dose at the normal time. Do not take double or extra doses. ?What may interact with this medication? ?Acetazolamide ?Alcohol ?Antihistamines for allergy, cough, and cold ?Aspirin and aspirin-like medications ?Atropine ?Birth control pills ?Certain medications for anxiety or sleep ?Certain medications for bladder problems like oxybutynin, tolterodine ?Certain medications for depression like amitriptyline, fluoxetine, sertraline ?Certain medications for Parkinson's disease like benztropine, trihexyphenidyl ?Certain medications  for seizures like carbamazepine, lamotrigine, phenobarbital, phenytoin, primidone, valproic acid, zonisamide ?Certain medications for stomach problems like dicyclomine, hyoscyamine ?Certain medications for travel sickness li

## 2021-07-21 ENCOUNTER — Encounter: Payer: Self-pay | Admitting: Neurology

## 2021-07-24 ENCOUNTER — Other Ambulatory Visit: Payer: Self-pay | Admitting: Neurology

## 2021-07-24 ENCOUNTER — Telehealth: Payer: Self-pay | Admitting: *Deleted

## 2021-07-24 MED ORDER — AJOVY 225 MG/1.5ML ~~LOC~~ SOAJ: 225.0000 mg | SUBCUTANEOUS | 3 refills | Status: DC

## 2021-07-24 NOTE — Addendum Note (Signed)
Addended by: Bertram Savin on: 07/24/2021 12:07 PM ? ? Modules accepted: Orders ? ?

## 2021-07-24 NOTE — Telephone Encounter (Signed)
Per Dr Lucia Gaskins, Ajovy 225 mg ordered q30 days and Topiramate canceled. ?

## 2021-07-24 NOTE — Telephone Encounter (Signed)
PA for NURTEC KEY [id number] to CMM.   ?

## 2021-07-25 ENCOUNTER — Telehealth: Payer: Self-pay

## 2021-07-25 ENCOUNTER — Other Ambulatory Visit: Payer: Self-pay | Admitting: Neurology

## 2021-07-25 DIAGNOSIS — G43109 Migraine with aura, not intractable, without status migrainosus: Secondary | ICD-10-CM

## 2021-07-25 NOTE — Telephone Encounter (Signed)
Received notice from pharmacy that ajovy not covered. ? ?PA for ajovy started via CMM. Sent to [place]. Should have a determination within 3-5 business days. Pamela Norton. ?

## 2021-07-27 MED ORDER — NURTEC 75 MG PO TBDP: 75.0000 mg | ORAL_TABLET | Freq: Every day | ORAL | 6 refills | Status: DC | PRN

## 2021-07-27 NOTE — Telephone Encounter (Signed)
Received denial for nurtec 16 tabs/month (approved is only used as a preventative treatment of episodic migraines, treatment for more the 8 migraines per month.  It is approved for 8 per month they 07-25-2022.  CASE PA [id number].  Faxed copy to pharmacy as well.  ?

## 2021-07-28 NOTE — Telephone Encounter (Signed)
Pamela Norton,  ?  ?Received denial for nurtec 16 tabs/month (approved if only used as a preventative treatment of episodic migraines, and  treatment for more the 8 migraines per month.  ?  ? It is approved for 8 tablets per month they 07-25-2022.  CASE PA [id number]. ?  ?You should be able to get 8 tabs at the pharmacy.   ?  ?Sandy RN  ? ?Last read by Pamela Norton "[name]" at  2:02 PM on 07/27/2021. ?Me ?  ?   1:21 PM ?Note ?Received denial for nurtec 16 tabs/month (approved is only used as a preventative treatment of episodic migraines, treatment for more the 8 migraines per month.  It is approved for 8 per month they 07-25-2022.  CASE PA [id number].  Faxed copy to pharmacy as well.  ?  ? ?

## 2021-08-01 ENCOUNTER — Ambulatory Visit (INDEPENDENT_AMBULATORY_CARE_PROVIDER_SITE_OTHER): Payer: 59

## 2021-08-01 ENCOUNTER — Ambulatory Visit: Payer: 59 | Admitting: Podiatry

## 2021-08-01 DIAGNOSIS — M2012 Hallux valgus (acquired), left foot: Secondary | ICD-10-CM

## 2021-08-01 DIAGNOSIS — M2011 Hallux valgus (acquired), right foot: Secondary | ICD-10-CM

## 2021-08-01 DIAGNOSIS — M2010 Hallux valgus (acquired), unspecified foot: Secondary | ICD-10-CM

## 2021-08-01 NOTE — Progress Notes (Signed)
Pamela Norton presents today with painful sharp stabbing pain to the first metatarsal phalangeal joint of the right foot.  States that she started to develop a minor bunion there.  States has been bothering her since April.  She thinks it may have come about with tighter shoes and being on her feet for longer period of time potting plants.  She is still has some numbness and tingling in the first metatarsal and second metatarsophalangeal joint area of the left foot post surgery. ? ?Objective: Vital signs are stable tellurian x3 great range of motion of the first metatarsophalangeal joint of the left foot no reproducible pain on palpation of the left foot that is minimally swollen.  Radiographs of the left foot taken today demonstrate an osseous immature with individual with retention of a single Integris 3.0 screw. ? ?Right first metatarsophalangeal joint does demonstrate a mild increase in the first intermetatarsal angle with tibial sesamoid position of approximately 3-1/2-4.  She does have a prominent medial hypertrophic condyle.  This is sharply tender on palpation as the medial dorsal cutaneous nerve rides over top of it. ? ?Assessment: Neuritis hallux valgus right foot.  Resolving neuritis neuropathy status post [facility] bunion repair left foot. ?

## 2021-08-30 ENCOUNTER — Encounter: Payer: Self-pay | Admitting: Neurology

## 2021-09-21 ENCOUNTER — Encounter: Payer: 59 | Admitting: Podiatry

## 2021-10-10 ENCOUNTER — Encounter: Payer: Self-pay | Admitting: Neurology

## 2021-10-10 ENCOUNTER — Other Ambulatory Visit: Payer: Self-pay | Admitting: Neurology

## 2021-10-10 ENCOUNTER — Telehealth: Payer: Self-pay | Admitting: *Deleted

## 2021-10-10 MED ORDER — EMGALITY 120 MG/ML ~~LOC~~ SOAJ: 1.0000 "pen " | SUBCUTANEOUS | 3 refills | Status: DC

## 2021-10-10 NOTE — Telephone Encounter (Signed)
Initiated Emgality PA KEY [id number].

## 2021-10-11 NOTE — Telephone Encounter (Signed)
PA pending on CMM.

## 2021-10-11 NOTE — Telephone Encounter (Signed)
[facility], on behalf of [employer], is responsible for reviewing pharmacy services provided to Pamela Norton. [facility] [facility] received a request on 10/11/2021 from your prescriber for coverage of Emgality Inj 120mg /Ml. Your request for Emgality Inj 120mg /Ml has been approved. How long does this approval last? EMGALITY INJ 120MG /ML, use as directed (1 injection per month), is approved for 6 months through 04/13/2022 or until coverage for the medication is no longer available under your benefit plan or the medication becomes subject to a pharmacy benefit coverage requirement, such as supply limits or notification, whichever occurs first as allowed by law. Please note: Doses/quantities above plan limits and/or maximum Food and Drug Administration (FDA) approved dosing may be subject to further review. Fax confirmation to pharmacy received.

## 2021-10-23 ENCOUNTER — Ambulatory Visit: Payer: 59 | Admitting: Internal Medicine

## 2021-10-23 ENCOUNTER — Ambulatory Visit (INDEPENDENT_AMBULATORY_CARE_PROVIDER_SITE_OTHER): Payer: 59

## 2021-10-23 ENCOUNTER — Encounter: Payer: Self-pay | Admitting: Internal Medicine

## 2021-10-23 VITALS — BP 134/88 | HR 100 | Ht 62.0 in | Wt 142.4 lb

## 2021-10-23 DIAGNOSIS — R002 Palpitations: Secondary | ICD-10-CM

## 2021-10-23 NOTE — Progress Notes (Unsigned)
Patient enrolled for [employer] to ship a 3 day holter monitor with Bridge and 27M repositionable electrodes (2660-5), and hydrocollide strips.

## 2021-10-23 NOTE — Progress Notes (Signed)
Cardiology Office Note:    Date:  10/23/2021   ID:  Pamela Norton, DOB 1990-12-27, MRN [id number]  PCP:  Janie Morning, [place] [facility] Cardiologist:  None     Referring MD: Janie Morning, DO   No chief complaint on file. Palpitations  History of Present Illness:    Pamela Norton is a 31 y.o. female with a hx below, referral for palpations.  She notes higher heart rates. She has palpitations. She notes some rates just above 100. That she's been documenting daily since July. She is using her albuterol and she was using it often in July. She's on albuterol and spiriva with allergic asthma. .She drinks coffee 1-2 cups per day.  Has neurocardiogenic syncope with needles. She has some LH or dizziness 1-2 per week. She has anxiety. She has chest tightness and SOB at times. No smoking.  She denies heavy etoh use, family hx of SCD. She has no known structural heart dx  She brought  list of her Bps which are normal.   Family Hx: mother has PVCs.   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   azelastine (ASTELIN) 0.1 % nasal spray Place into both nostrils.   Baclofen 20 MG/20ML SOLN    budesonide-formoterol (SYMBICORT) 160-4.5 MCG/ACT inhaler USE 2 PUFFS TWICE A DAY   EPINEPHrine 0.3 mg/0.3 mL IJ SOAJ injection See admin instructions.   ethynodiol-ethinyl estradiol (ZOVIA) 1-35 MG-MCG tablet     fexofenadine (ALLEGRA) 180 MG tablet Take 180 mg by mouth in the morning and at bedtime.   FLUoxetine (PROZAC) 20 MG capsule Take 20 mg by mouth every morning.   Galcanezumab-gnlm (EMGALITY) 120 MG/ML SOAJ Inject 1 pen  into the skin every 30 (thirty) days.   montelukast (SINGULAIR) 10 MG tablet Take 10 mg by mouth at bedtime.   Multiple Vitamin (MULTIVITAMIN) capsule Take 1 capsule by mouth daily.   omeprazole (PRILOSEC) 20 MG capsule Take 20 mg by mouth daily.   Rimegepant Sulfate (NURTEC) 75 MG TBDP Take 75 mg by mouth daily as needed. For migraines. Take as close to onset of migraine as possible. One daily maximum.   SPIRIVA RESPIMAT 1.25 MCG/ACT AERS SMARTSIG:2 Puff(s) Via Inhaler Daily   tacrolimus (PROTOPIC) 0.03 % ointment Apply 1 application topically 2 (two) times daily as needed.   triamcinolone (NASACORT) 55 MCG/ACT AERO nasal inhaler Place 2 sprays into the nose daily.     Allergies:   Clarithromycin, Amphetamine-dextroamphetamine, Cefaclor, Food, Meloxicam, Peanuts [peanut oil], Prednisone, Soy allergy, Coconut (cocos nucifera), and Latex   Social History   Socioeconomic History   Marital status: Single    Spouse name: Not on file   Number of children: Not on file   Years of education: Not on file  Highest education level: Not on file  Occupational History   Not on file  Tobacco Use   Smoking status: Never   Smokeless tobacco: Never  Substance and Sexual Activity   Alcohol use: Yes    Alcohol/week: 1.0 standard drink of alcohol    Types: 1 Cans of beer per week   Drug use: No   Sexual activity: Never    Birth control/protection: Pill  Other Topics Concern   Not on file  Social History Narrative   Not on file   Social Determinants of Health   Financial Resource Strain: Not on file  Food Insecurity: Not on file  Transportation Needs: Not on file  Physical Activity: Not on file  Stress: Not on file  Social Connections: Not on file     Family  History: The patient's family history includes Breast cancer in an other family member; Colon cancer (age of onset: 66) in her maternal grandmother; Colon polyps in her father and mother; Dementia in her paternal grandmother; Fibromyalgia in her mother; Hypothyroidism in her mother; Kidney cancer (age of onset: 60) in her maternal grandfather; Melanoma in her paternal grandfather; Melanoma (age of onset: 53) in her mother; Migraines in her mother; Osteoarthritis in her mother; Restless legs syndrome in her mother; Throat cancer in her paternal grandfather; Thyroid cancer (age of onset: 43) in her mother; Thyroid disease in her mother.  ROS:   Please see the history of present illness.     All other systems reviewed and are negative.  EKGs/Labs/Other Studies Reviewed:    The following studies were reviewed today:   EKG:  EKG is  ordered today.  The ekg ordered today demonstrates   10/23/2021- sinus rhythm rate 100 bpm  Recent Labs: No results found for requested labs within last 365 days.  Recent Lipid Panel No results found for: "CHOL", "TRIG", "HDL", "CHOLHDL", "VLDL", "LDLCALC", "LDLDIRECT"   Risk Assessment/Calculations:           Physical Exam:    VS:   Vitals:   10/23/21 0951  BP: 134/88  Pulse: 100  SpO2: 97%     Wt Readings from Last 3 Encounters:  10/23/21 142 lb 6.4 oz (64.6 kg)  07/19/21 141 lb (64 kg)  10/21/17 127 lb (57.6 kg)     GEN:  Well nourished, well developed in no acute distress HEENT: Normal NECK: No JVD; No carotid bruits LYMPHATICS: No lymphadenopathy CARDIAC: RRR, no murmurs, rubs, gallops RESPIRATORY:  Clear to auscultation without rales, wheezing or rhonchi  ABDOMEN: Soft, non-tender, non-distended MUSCULOSKELETAL:  No edema; No deformity  SKIN: Warm and dry NEUROLOGIC:  Alert and oriented x 3 PSYCHIATRIC:  Normal affect   ASSESSMENT:    Palpitations:She does not have high risk features including syncope c/f arrhythmia , family hx of  SCD, or abnormalities on her EKG. We discussed cutting back on caffeine. We discussed her albuterol is contributing which is fine. Also strongly suspect her anxiety is playing a role with inappropriate sinus tachycardia. Recommend management for this. Otherwise she is low risk for coronary disease and has no signs of structural heart dx. Will plan for a cardiac monitor to ensure no arrhythmia. Not a candidate for propanolol w/ asthma. We discussed aiming to manage per above before medications  PLAN:    In order of problems listed above:  7 days of preventiss (has sensitivity to adhesive tape      Medication Adjustments/Labs and Tests Ordered: Current medicines are reviewed at length with the  patient today.  Concerns regarding medicines are outlined above.  Orders Placed This Encounter  Procedures   LONG TERM MONITOR (3-14 DAYS)   EKG 12-Lead   No orders of the defined types were placed in this encounter.   Patient Instructions  Medication Instructions:  NO CHANGES  *If you need a refill on your cardiac medications before your next appointment, please call your pharmacy*   Testing/Procedures: Your physician has recommended that you wear an event monitor for 3 days. Event monitors are medical devices that record the heart's electrical activity. Doctors most often [place] these monitors to diagnose arrhythmias. Arrhythmias are problems with the speed or rhythm of the heartbeat. The monitor is a small, portable device. You can wear one while you do your normal daily activities. This is usually used to diagnose what is causing palpitations/syncope (passing out). This will be mailed to your with instructions on application     Follow-Up: At [facility], you and your health needs are our priority.  As part of our continuing mission to provide you with exceptional heart care, we have created designated Provider Care Teams.  These Care Teams include your primary Cardiologist (physician) and  Advanced Practice Providers (APPs -  Physician Assistants and Nurse Practitioners) who all work together to provide you with the care you need, when you need it.  We recommend signing up for the patient portal called "MyChart".  Sign up information is provided on this After Visit Summary.  MyChart is used to connect with patients for Virtual Visits (Telemedicine).  Patients are able to view lab/test results, encounter notes, upcoming appointments, etc.  Non-urgent messages can be sent to your provider as well.   To learn more about what you can do with MyChart, go to [email or web address].    Your next appointment:    AS NEEDED with Dr. Harl Bowie (pending monitor results)     Palpitations Palpitations are feelings that your heartbeat is irregular or is faster than normal. It may feel like your heart is fluttering or skipping a beat. Palpitations may be caused by many things, including smoking, caffeine, alcohol, stress, and certain medicines or drugs. Most causes of palpitations are not serious.  However, some palpitations can be a sign of a serious problem. Further tests and a thorough medical history will be done to find the cause of your palpitations. Your provider may order tests such as an ECG, labs, an echocardiogram, or an ambulatory continuous ECG monitor. Follow these instructions at home: Pay attention to any changes in your symptoms. Let your health care provider know about them. Take these actions to help manage your symptoms: Eating and drinking Follow instructions from your health care provider about eating or drinking restrictions. You may need to avoid foods and drinks that may cause palpitations. These may include: Caffeinated coffee, tea, soft drinks, and energy drinks. Chocolate. Alcohol. Diet pills. Lifestyle     Take steps to reduce your stress and anxiety. Things that can help you relax include: Yoga. Mind-body activities, such as deep breathing, meditation, or using  words and images to create positive thoughts (guided imagery). Physical activity, such as swimming, jogging, or walking. Tell your health care provider if your palpitations increase with activity. If you have chest pain or shortness of breath with activity, do not continue the activity until you are seen by your health care provider. Biofeedback. This is a method that helps you learn to use your mind to control things in your body, such as your heartbeat. Get  plenty of rest and sleep. Keep a regular bed time. Do not use drugs, including cocaine or ecstasy. Do not use marijuana. Do not use any products that contain nicotine or tobacco. These products include cigarettes, chewing tobacco, and vaping devices, such as e-cigarettes. If you need help quitting, ask your health care provider. General instructions Take over-the-counter and prescription medicines only as told by your health care provider. Keep all follow-up visits. This is important. These may include visits for further testing if palpitations do not go away or get worse. Contact a health care provider if: You continue to have a fast or irregular heartbeat for a long period of time. You notice that your palpitations occur more often. Get help right away if: You have chest pain or shortness of breath. You have a severe headache. You feel dizzy or you faint. These symptoms may represent a serious problem that is an emergency. Do not wait to see if the symptoms will go away. Get medical help right away. Call your local emergency services (911 in the [place] Do not drive yourself to the hospital. Summary Palpitations are feelings that your heartbeat is irregular or is faster than normal. It may feel like your heart is fluttering or skipping a beat. Palpitations may be caused by many things, including smoking, caffeine, alcohol, stress, certain medicines, and drugs. Further tests and a thorough medical history may be done to find the cause of  your palpitations. Get help right away if you faint or have chest pain, shortness of breath, severe headache, or dizziness. This information is not intended to replace advice given to you by your health care provider. Make sure you discuss any questions you have with your health care provider. Document Revised: 07/27/2020 Document Reviewed: 07/27/2020 Elsevier Patient Education  [address].     Signed, Maisie Fus, MD  10/23/2021 10:19 AM    [facility]

## 2021-10-23 NOTE — Patient Instructions (Addendum)
Medication Instructions:  NO CHANGES  *If you need a refill on your cardiac medications before your next appointment, please call your pharmacy*   Testing/Procedures: Your physician has recommended that you wear an event monitor for 3 days. Event monitors are medical devices that record the heart's electrical activity. Doctors most often [place] these monitors to diagnose arrhythmias. Arrhythmias are problems with the speed or rhythm of the heartbeat. The monitor is a small, portable device. You can wear one while you do your normal daily activities. This is usually used to diagnose what is causing palpitations/syncope (passing out). This will be mailed to your with instructions on application     Follow-Up: At [facility], you and your health needs are our priority.  As part of our continuing mission to provide you with exceptional heart care, we have created designated Provider Care Teams.  These Care Teams include your primary Cardiologist (physician) and Advanced Practice Providers (APPs -  Physician Assistants and Nurse Practitioners) who all work together to provide you with the care you need, when you need it.  We recommend signing up for the patient portal called "MyChart".  Sign up information is provided on this After Visit Summary.  MyChart is used to connect with patients for Virtual Visits (Telemedicine).  Patients are able to view lab/test results, encounter notes, upcoming appointments, etc.  Non-urgent messages can be sent to your provider as well.   To learn more about what you can do with MyChart, go to [email or web address].    Your next appointment:    AS NEEDED with Dr. Wyline Mood (pending monitor results)     Palpitations Palpitations are feelings that your heartbeat is irregular or is faster than normal. It may feel like your heart is fluttering or skipping a beat. Palpitations may be caused by many things, including smoking, caffeine, alcohol, stress, and certain  medicines or drugs. Most causes of palpitations are not serious.  However, some palpitations can be a sign of a serious problem. Further tests and a thorough medical history will be done to find the cause of your palpitations. Your provider may order tests such as an ECG, labs, an echocardiogram, or an ambulatory continuous ECG monitor. Follow these instructions at home: Pay attention to any changes in your symptoms. Let your health care provider know about them. Take these actions to help manage your symptoms: Eating and drinking Follow instructions from your health care provider about eating or drinking restrictions. You may need to avoid foods and drinks that may cause palpitations. These may include: Caffeinated coffee, tea, soft drinks, and energy drinks. Chocolate. Alcohol. Diet pills. Lifestyle     Take steps to reduce your stress and anxiety. Things that can help you relax include: Yoga. Mind-body activities, such as deep breathing, meditation, or using words and images to create positive thoughts (guided imagery). Physical activity, such as swimming, jogging, or walking. Tell your health care provider if your palpitations increase with activity. If you have chest pain or shortness of breath with activity, do not continue the activity until you are seen by your health care provider. Biofeedback. This is a method that helps you learn to use your mind to control things in your body, such as your heartbeat. Get plenty of rest and sleep. Keep a regular bed time. Do not use drugs, including cocaine or ecstasy. Do not use marijuana. Do not use any products that contain nicotine or tobacco. These products include cigarettes, chewing tobacco, and vaping devices, such as e-cigarettes. If you  need help quitting, ask your health care provider. General instructions Take over-the-counter and prescription medicines only as told by your health care provider. Keep all follow-up visits. This is  important. These may include visits for further testing if palpitations do not go away or get worse. Contact a health care provider if: You continue to have a fast or irregular heartbeat for a long period of time. You notice that your palpitations occur more often. Get help right away if: You have chest pain or shortness of breath. You have a severe headache. You feel dizzy or you faint. These symptoms may represent a serious problem that is an emergency. Do not wait to see if the symptoms will go away. Get medical help right away. Call your local emergency services (911 in the [place] Do not drive yourself to the hospital. Summary Palpitations are feelings that your heartbeat is irregular or is faster than normal. It may feel like your heart is fluttering or skipping a beat. Palpitations may be caused by many things, including smoking, caffeine, alcohol, stress, certain medicines, and drugs. Further tests and a thorough medical history may be done to find the cause of your palpitations. Get help right away if you faint or have chest pain, shortness of breath, severe headache, or dizziness. This information is not intended to replace advice given to you by your health care provider. Make sure you discuss any questions you have with your health care provider. Document Revised: 07/27/2020 Document Reviewed: 07/27/2020 Elsevier Patient Education  2023 [employer].

## 2021-11-06 DIAGNOSIS — R002 Palpitations: Secondary | ICD-10-CM | POA: Diagnosis not present

## 2021-11-21 ENCOUNTER — Encounter: Payer: Self-pay | Admitting: Neurology

## 2021-11-22 NOTE — Telephone Encounter (Signed)
Pt has been scheduled with Sarah for 07/10/2022 at 9:45am

## 2021-11-23 ENCOUNTER — Ambulatory Visit: Payer: 59 | Admitting: Adult Health

## 2022-01-19 ENCOUNTER — Encounter: Payer: Self-pay | Admitting: Podiatry

## 2022-02-11 ENCOUNTER — Other Ambulatory Visit: Payer: Self-pay | Admitting: Neurology

## 2022-02-12 ENCOUNTER — Encounter: Payer: Self-pay | Admitting: Neurology

## 2022-02-13 MED ORDER — UBRELVY 100 MG PO TABS: ORAL_TABLET | ORAL | 4 refills | Status: DC

## 2022-02-16 DIAGNOSIS — J3081 Allergic rhinitis due to animal (cat) (dog) hair and dander: Secondary | ICD-10-CM | POA: Diagnosis not present

## 2022-02-16 DIAGNOSIS — J3089 Other allergic rhinitis: Secondary | ICD-10-CM | POA: Diagnosis not present

## 2022-02-16 DIAGNOSIS — J301 Allergic rhinitis due to pollen: Secondary | ICD-10-CM | POA: Diagnosis not present

## 2022-02-20 ENCOUNTER — Other Ambulatory Visit: Payer: Self-pay | Admitting: Neurology

## 2022-02-22 ENCOUNTER — Telehealth: Payer: Self-pay | Admitting: *Deleted

## 2022-02-22 ENCOUNTER — Encounter: Payer: Self-pay | Admitting: Neurology

## 2022-02-22 NOTE — Telephone Encounter (Signed)
Completed Emgality PA w/ [employer] on CMM. Key: [id number]. Awaiting determination.

## 2022-02-23 DIAGNOSIS — J301 Allergic rhinitis due to pollen: Secondary | ICD-10-CM | POA: Diagnosis not present

## 2022-02-23 DIAGNOSIS — J3081 Allergic rhinitis due to animal (cat) (dog) hair and dander: Secondary | ICD-10-CM | POA: Diagnosis not present

## 2022-02-23 DIAGNOSIS — J3089 Other allergic rhinitis: Secondary | ICD-10-CM | POA: Diagnosis not present

## 2022-02-26 NOTE — Telephone Encounter (Signed)
Emgality approved effective from 02/22/2022 through 05/16/2022.

## 2022-03-02 DIAGNOSIS — J3089 Other allergic rhinitis: Secondary | ICD-10-CM | POA: Diagnosis not present

## 2022-03-02 DIAGNOSIS — J3081 Allergic rhinitis due to animal (cat) (dog) hair and dander: Secondary | ICD-10-CM | POA: Diagnosis not present

## 2022-03-02 DIAGNOSIS — J301 Allergic rhinitis due to pollen: Secondary | ICD-10-CM | POA: Diagnosis not present

## 2022-03-09 DIAGNOSIS — J301 Allergic rhinitis due to pollen: Secondary | ICD-10-CM | POA: Diagnosis not present

## 2022-03-09 DIAGNOSIS — J3089 Other allergic rhinitis: Secondary | ICD-10-CM | POA: Diagnosis not present

## 2022-03-09 DIAGNOSIS — J3081 Allergic rhinitis due to animal (cat) (dog) hair and dander: Secondary | ICD-10-CM | POA: Diagnosis not present

## 2022-03-15 DIAGNOSIS — Z111 Encounter for screening for respiratory tuberculosis: Secondary | ICD-10-CM | POA: Diagnosis not present

## 2022-03-16 DIAGNOSIS — J3081 Allergic rhinitis due to animal (cat) (dog) hair and dander: Secondary | ICD-10-CM | POA: Diagnosis not present

## 2022-03-16 DIAGNOSIS — J3089 Other allergic rhinitis: Secondary | ICD-10-CM | POA: Diagnosis not present

## 2022-03-16 DIAGNOSIS — J301 Allergic rhinitis due to pollen: Secondary | ICD-10-CM | POA: Diagnosis not present

## 2022-03-20 DIAGNOSIS — Z111 Encounter for screening for respiratory tuberculosis: Secondary | ICD-10-CM | POA: Diagnosis not present

## 2022-03-23 DIAGNOSIS — J301 Allergic rhinitis due to pollen: Secondary | ICD-10-CM | POA: Diagnosis not present

## 2022-03-23 DIAGNOSIS — J3081 Allergic rhinitis due to animal (cat) (dog) hair and dander: Secondary | ICD-10-CM | POA: Diagnosis not present

## 2022-03-23 DIAGNOSIS — J3089 Other allergic rhinitis: Secondary | ICD-10-CM | POA: Diagnosis not present

## 2022-03-28 DIAGNOSIS — J301 Allergic rhinitis due to pollen: Secondary | ICD-10-CM | POA: Diagnosis not present

## 2022-03-28 DIAGNOSIS — J3081 Allergic rhinitis due to animal (cat) (dog) hair and dander: Secondary | ICD-10-CM | POA: Diagnosis not present

## 2022-03-30 DIAGNOSIS — J301 Allergic rhinitis due to pollen: Secondary | ICD-10-CM | POA: Diagnosis not present

## 2022-03-30 DIAGNOSIS — J3089 Other allergic rhinitis: Secondary | ICD-10-CM | POA: Diagnosis not present

## 2022-03-30 DIAGNOSIS — J3081 Allergic rhinitis due to animal (cat) (dog) hair and dander: Secondary | ICD-10-CM | POA: Diagnosis not present

## 2022-04-06 DIAGNOSIS — J3089 Other allergic rhinitis: Secondary | ICD-10-CM | POA: Diagnosis not present

## 2022-04-06 DIAGNOSIS — J3081 Allergic rhinitis due to animal (cat) (dog) hair and dander: Secondary | ICD-10-CM | POA: Diagnosis not present

## 2022-04-06 DIAGNOSIS — J301 Allergic rhinitis due to pollen: Secondary | ICD-10-CM | POA: Diagnosis not present

## 2022-04-12 DIAGNOSIS — J3081 Allergic rhinitis due to animal (cat) (dog) hair and dander: Secondary | ICD-10-CM | POA: Diagnosis not present

## 2022-04-12 DIAGNOSIS — J301 Allergic rhinitis due to pollen: Secondary | ICD-10-CM | POA: Diagnosis not present

## 2022-04-12 DIAGNOSIS — J3089 Other allergic rhinitis: Secondary | ICD-10-CM | POA: Diagnosis not present

## 2022-04-19 DIAGNOSIS — J3081 Allergic rhinitis due to animal (cat) (dog) hair and dander: Secondary | ICD-10-CM | POA: Diagnosis not present

## 2022-04-19 DIAGNOSIS — J3089 Other allergic rhinitis: Secondary | ICD-10-CM | POA: Diagnosis not present

## 2022-04-19 DIAGNOSIS — J301 Allergic rhinitis due to pollen: Secondary | ICD-10-CM | POA: Diagnosis not present

## 2022-04-24 DIAGNOSIS — M25511 Pain in right shoulder: Secondary | ICD-10-CM | POA: Diagnosis not present

## 2022-04-24 DIAGNOSIS — S40011A Contusion of right shoulder, initial encounter: Secondary | ICD-10-CM | POA: Diagnosis not present

## 2022-04-25 DIAGNOSIS — J3089 Other allergic rhinitis: Secondary | ICD-10-CM | POA: Diagnosis not present

## 2022-04-25 DIAGNOSIS — J301 Allergic rhinitis due to pollen: Secondary | ICD-10-CM | POA: Diagnosis not present

## 2022-04-25 DIAGNOSIS — J3081 Allergic rhinitis due to animal (cat) (dog) hair and dander: Secondary | ICD-10-CM | POA: Diagnosis not present

## 2022-05-03 DIAGNOSIS — J3081 Allergic rhinitis due to animal (cat) (dog) hair and dander: Secondary | ICD-10-CM | POA: Diagnosis not present

## 2022-05-03 DIAGNOSIS — J301 Allergic rhinitis due to pollen: Secondary | ICD-10-CM | POA: Diagnosis not present

## 2022-05-03 DIAGNOSIS — J3089 Other allergic rhinitis: Secondary | ICD-10-CM | POA: Diagnosis not present

## 2022-05-10 DIAGNOSIS — J3089 Other allergic rhinitis: Secondary | ICD-10-CM | POA: Diagnosis not present

## 2022-05-10 DIAGNOSIS — J301 Allergic rhinitis due to pollen: Secondary | ICD-10-CM | POA: Diagnosis not present

## 2022-05-10 DIAGNOSIS — J3081 Allergic rhinitis due to animal (cat) (dog) hair and dander: Secondary | ICD-10-CM | POA: Diagnosis not present

## 2022-05-17 DIAGNOSIS — J301 Allergic rhinitis due to pollen: Secondary | ICD-10-CM | POA: Diagnosis not present

## 2022-05-17 DIAGNOSIS — J3081 Allergic rhinitis due to animal (cat) (dog) hair and dander: Secondary | ICD-10-CM | POA: Diagnosis not present

## 2022-05-17 DIAGNOSIS — J3089 Other allergic rhinitis: Secondary | ICD-10-CM | POA: Diagnosis not present

## 2022-05-18 DIAGNOSIS — M7581 Other shoulder lesions, right shoulder: Secondary | ICD-10-CM | POA: Diagnosis not present

## 2022-05-22 DIAGNOSIS — M7521 Bicipital tendinitis, right shoulder: Secondary | ICD-10-CM | POA: Diagnosis not present

## 2022-05-24 DIAGNOSIS — J3081 Allergic rhinitis due to animal (cat) (dog) hair and dander: Secondary | ICD-10-CM | POA: Diagnosis not present

## 2022-05-24 DIAGNOSIS — J3089 Other allergic rhinitis: Secondary | ICD-10-CM | POA: Diagnosis not present

## 2022-05-24 DIAGNOSIS — J301 Allergic rhinitis due to pollen: Secondary | ICD-10-CM | POA: Diagnosis not present

## 2022-05-31 DIAGNOSIS — J3089 Other allergic rhinitis: Secondary | ICD-10-CM | POA: Diagnosis not present

## 2022-05-31 DIAGNOSIS — J301 Allergic rhinitis due to pollen: Secondary | ICD-10-CM | POA: Diagnosis not present

## 2022-05-31 DIAGNOSIS — J3081 Allergic rhinitis due to animal (cat) (dog) hair and dander: Secondary | ICD-10-CM | POA: Diagnosis not present

## 2022-06-07 DIAGNOSIS — J3081 Allergic rhinitis due to animal (cat) (dog) hair and dander: Secondary | ICD-10-CM | POA: Diagnosis not present

## 2022-06-07 DIAGNOSIS — J3089 Other allergic rhinitis: Secondary | ICD-10-CM | POA: Diagnosis not present

## 2022-06-07 DIAGNOSIS — J301 Allergic rhinitis due to pollen: Secondary | ICD-10-CM | POA: Diagnosis not present

## 2022-06-14 DIAGNOSIS — J3081 Allergic rhinitis due to animal (cat) (dog) hair and dander: Secondary | ICD-10-CM | POA: Diagnosis not present

## 2022-06-14 DIAGNOSIS — J3089 Other allergic rhinitis: Secondary | ICD-10-CM | POA: Diagnosis not present

## 2022-06-14 DIAGNOSIS — J301 Allergic rhinitis due to pollen: Secondary | ICD-10-CM | POA: Diagnosis not present

## 2022-06-19 DIAGNOSIS — M7521 Bicipital tendinitis, right shoulder: Secondary | ICD-10-CM | POA: Diagnosis not present

## 2022-06-21 DIAGNOSIS — J3081 Allergic rhinitis due to animal (cat) (dog) hair and dander: Secondary | ICD-10-CM | POA: Diagnosis not present

## 2022-06-21 DIAGNOSIS — J3089 Other allergic rhinitis: Secondary | ICD-10-CM | POA: Diagnosis not present

## 2022-06-21 DIAGNOSIS — J301 Allergic rhinitis due to pollen: Secondary | ICD-10-CM | POA: Diagnosis not present

## 2022-06-29 DIAGNOSIS — J3081 Allergic rhinitis due to animal (cat) (dog) hair and dander: Secondary | ICD-10-CM | POA: Diagnosis not present

## 2022-06-29 DIAGNOSIS — J3089 Other allergic rhinitis: Secondary | ICD-10-CM | POA: Diagnosis not present

## 2022-06-29 DIAGNOSIS — J301 Allergic rhinitis due to pollen: Secondary | ICD-10-CM | POA: Diagnosis not present

## 2022-06-30 ENCOUNTER — Other Ambulatory Visit: Payer: Self-pay | Admitting: Neurology

## 2022-07-05 ENCOUNTER — Encounter: Payer: Self-pay | Admitting: Neurology

## 2022-07-05 ENCOUNTER — Other Ambulatory Visit: Payer: Self-pay | Admitting: *Deleted

## 2022-07-05 DIAGNOSIS — J301 Allergic rhinitis due to pollen: Secondary | ICD-10-CM | POA: Diagnosis not present

## 2022-07-05 DIAGNOSIS — J3081 Allergic rhinitis due to animal (cat) (dog) hair and dander: Secondary | ICD-10-CM | POA: Diagnosis not present

## 2022-07-05 DIAGNOSIS — J3089 Other allergic rhinitis: Secondary | ICD-10-CM | POA: Diagnosis not present

## 2022-07-05 MED ORDER — UBRELVY 100 MG PO TABS: ORAL_TABLET | ORAL | 0 refills | Status: DC

## 2022-07-05 MED ORDER — EMGALITY 120 MG/ML ~~LOC~~ SOAJ: 1.0000 "pen " | SUBCUTANEOUS | 0 refills | Status: DC

## 2022-07-10 ENCOUNTER — Telehealth: Payer: Self-pay

## 2022-07-10 ENCOUNTER — Other Ambulatory Visit: Payer: Self-pay | Admitting: Neurology

## 2022-07-10 ENCOUNTER — Ambulatory Visit: Payer: BC Managed Care – PPO | Admitting: Neurology

## 2022-07-10 ENCOUNTER — Other Ambulatory Visit (HOSPITAL_COMMUNITY): Payer: Self-pay

## 2022-07-10 ENCOUNTER — Encounter: Payer: Self-pay | Admitting: Neurology

## 2022-07-10 VITALS — BP 130/91 | HR 102 | Ht 62.0 in | Wt 136.5 lb

## 2022-07-10 DIAGNOSIS — G43009 Migraine without aura, not intractable, without status migrainosus: Secondary | ICD-10-CM | POA: Diagnosis not present

## 2022-07-10 MED ORDER — EMGALITY 120 MG/ML ~~LOC~~ SOAJ: 1.0000 "pen " | SUBCUTANEOUS | 3 refills | Status: DC

## 2022-07-10 MED ORDER — UBRELVY 100 MG PO TABS: ORAL_TABLET | ORAL | 11 refills | Status: DC

## 2022-07-10 NOTE — Telephone Encounter (Deleted)
PA request submitted. New Encounter created for follow up. For additional info see Prior Auth telephone encounter from 07/10/2022.Marland Kitchen   PA submitted

## 2022-07-10 NOTE — Telephone Encounter (Signed)
Pharmacy Patient Advocate Encounter   Received notification from [facility] that prior authorization for Ubrelvy  Tablets is required/requested.   PA submitted on 07/10/2022 to (ins) [employer] [place] via [employer] or [facility]) confirmation # [id number] Status is pending

## 2022-07-10 NOTE — Patient Instructions (Signed)
Continue current medications See you back in 1 year  Meds ordered this encounter  Medications   Galcanezumab-gnlm (EMGALITY) 120 MG/ML SOAJ    Sig: Inject 1 pen  into the skin every 30 (thirty) days.    Dispense:  3 mL    Refill:  3   UBRELVY 100 MG TABS    Sig: Take 100 mg by mouth if needed as close to onset of migraine as possible. May repeat in 2 hours if headache persists or returns. Do not exceed 2 tablets in 24 hours.    Dispense:  10 tablet    Refill:  11    Cancel nurtec

## 2022-07-10 NOTE — Progress Notes (Signed)
Patient: Pamela Norton Date of Birth: 04/03/1990  Reason for Visit: Follow up History from: Patient Primary Neurologist: Lucia Gaskins   ASSESSMENT AND PLAN 32 y.o. year old female   1.  Chronic migraine headache  -Doing much better, has had very good improvement -Continue Emgality 120 mg monthly injection for migraine prevention -Continue Ubrelvy 100 mg as needed for acute headache treatment -Call for worsening headache, follow-up in 1 year or sooner if needed -Next steps: Qulipta or Botox  HISTORY OF PRESENT ILLNESS: Today 07/10/22 At last visit in May 2023 Dr. Lucia Gaskins started Topamax had drowsiness, slow cognition.  Started Ajovy in May 2023.  Did 3 months, then switched to [employer] for insurance reasons. Doing well on Emgality, few and less intense migraines. 2-3 migraines a month now, was having 8 or more a month. Has been noticing some insomnia after taking the Emgality at night, yesterday she tried it in the morning. Recently we had to stop the Nurtec due to insurance, ordered [place]. 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   HISTORY  07/19/21 Dr. Lucia Gaskins: HPI:  Pamela Norton is a 32 y.o. female here as requested by Irena Reichmann, DO for migraines. PMHx migraine headaches, asthma, celiac disease, depression, idiopathic hypersomnia.  I reviewed Dr. Thomasena Edis notes, she was initially referred to [facility] headache [facility] per patient's request for worsening migraine headaches, but she is here at [facility] neurolog[facility] for migraines.  She also has anxiety.  Hemoglobin A1c in June 2022 was 5.6, thyroid was 5.01 which was elevated but free T4 was normal, LDL was 131 which was elevated, CBC was normal, CMP was unremarkable with BUN 9 and creatinine 0.68, physical examination was unremarkable, she has been started on Ubrelvy in the past for her migraines and given samples, it appears she is also had Relpax on medication list, she has a history of migraines  usually 1/month increased to 3-4, unknown triggers, Relpax not helping as much, described as throbbing sharp pain around the entire head, some nausea, no vision changes or vomiting, positive photophobia.  More recent labs include a normal free T4 all collected in February 2023, normal TSH, negative thyroid antibody panel.   She has had migraines since HS since 12-15, mom has migraines and maternal father also had migraine. No visual aura but sometimes has some speech changes, usually this happens before the migraine 1-2, not every time but recurs in the exact same manner with multiple migraines had it at least with 5 migraines many more.She has never been able to pinpoint a trigger. It starts as a normal headahe and progresses. She may wake up with one. Pain is behind the eyes, sharp, throbbing/pulsating, +phono/photophobia, nausea, diarrhea, moderate to severe, occ vomiting, movement makes them worse, can last 24 hours. Not posotional but after she exercises she can have a headache ongoing 3-4 years. She did have an MRi in the past and it was negative. She has a lot of food allergies and celiac disease. For the last 2 years she gets 2 a week, 8 migraine days a month and > 15headache days a month (headaches+migraines). Relpax did not work. Bernita Raisin worked for a while and doesn't help. No other focal neurologic deficits, associated symptoms, inciting events or modifiable factors.   Propranolol contraindicated due to asthma, never been on topamax/topiramate, been on multiple depression medications prozac, amitriptyline(side effects could not tolerate), wellbutrin, celexa, lexapro, imitrex, relpax, aimovig contraindicated due to celiac disease and constipation.  REVIEW OF SYSTEMS: Out of a complete 14 system review of symptoms, the patient complains only of the following symptoms, and all other reviewed systems are negative.  See HPI  ALLERGIES: Allergies  Allergen Reactions   Clarithromycin Anxiety, Nausea  Only and Shortness Of Breath   Amphetamine-Dextroamphetamine     Other reaction(s): nausea   Cefaclor     REACTION: Hives Other reaction(s): hives   Food     Multiple Food Allergies: bell Peppers, Garlic, Shellfish, Pine Nuts, Peanuts, Soy, Melon, Watermelon, Honeydew, Cantaloupe, Squash, Pumpkin, Gourd, Zucchini   Meloxicam     Other reaction(s): migraine and hives   Peanuts [Peanut Oil]    Prednisone     Other reaction(s): tachycardia   Soy Allergy    Coconut (Cocos Nucifera) Dermatitis, Hives and Rash   Latex Hives and Rash    HOME MEDICATIONS: Outpatient Medications Prior to Visit  Medication Sig Dispense Refill   albuterol (ACCUNEB) 0.63 MG/3ML nebulizer solution Take 1 ampule by nebulization every 6 (six) hours as needed for Wheezing.     azelastine (ASTELIN) 0.1 % nasal spray Place into both nostrils.     Baclofen 20 MG/20ML SOLN 2 (two) times daily as needed.     budesonide-formoterol (SYMBICORT) 160-4.5 MCG/ACT inhaler USE 2 PUFFS TWICE A DAY     EPINEPHrine 0.3 mg/0.3 mL IJ SOAJ injection See admin instructions.  0   ethynodiol-ethinyl estradiol (ZOVIA) 1-35 MG-MCG tablet      fexofenadine (ALLEGRA) 180 MG tablet Take 180 mg by mouth in the morning and at bedtime.     FLUoxetine (PROZAC) 20 MG capsule Take 20 mg by mouth every morning.  2   montelukast (SINGULAIR) 10 MG tablet Take 10 mg by mouth at bedtime.     Multiple Vitamin (MULTIVITAMIN) capsule Take 1 capsule by mouth daily.     omeprazole (PRILOSEC) 20 MG capsule Take 20 mg by mouth daily.     SPIRIVA RESPIMAT 1.25 MCG/ACT AERS SMARTSIG:2 Puff(s) Via Inhaler Daily     triamcinolone (NASACORT) 55 MCG/ACT AERO nasal inhaler Place 2 sprays into the nose daily.     Galcanezumab-gnlm (EMGALITY) 120 MG/ML SOAJ Inject 1 pen  into the skin every 30 (thirty) days. 1 mL 0   UBRELVY 100 MG TABS Take 100 mg by mouth if needed as close to onset of migraine as possible. May repeat in 2 hours if headache persists or returns. Do  not exceed 2 tablets in 24 hours. 10 tablet 0   tacrolimus (PROTOPIC) 0.03 % ointment Apply 1 application topically 2 (two) times daily as needed.     No facility-administered medications prior to visit.    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     FAMILY HISTORY: Family History  Problem Relation Age of Onset   Thyroid disease Mother    Hypothyroidism Mother    Osteoarthritis Mother    Fibromyalgia Mother    Restless legs  syndrome Mother    Thyroid cancer Mother 27       Papillary   Melanoma Mother 28   Colon polyps Mother        cscope every 3 years   Migraines Mother    Colon polyps Father    Colon cancer Maternal Grandmother 44   Kidney cancer Maternal Grandfather 57   Dementia Paternal Grandmother    Throat cancer Paternal Grandfather    Melanoma Paternal Grandfather    Breast cancer Other     SOCIAL HISTORY: Social History   Socioeconomic History   Marital status: Single    Spouse name: Not on file   Number of children: Not on file   Years of education: Not on file   Highest education level: Not on file  Occupational History   Not on file  Tobacco Use   Smoking status: Never   Smokeless tobacco: Never  Substance and Sexual Activity   Alcohol use: Yes    Alcohol/week: 1.0 standard drink of alcohol    Types: 1 Cans of beer per week   Drug use: No   Sexual activity: Never    Birth control/protection: Pill  Other Topics Concern   Not on file  Social History Narrative   Not on file   Social Determinants of Health    Financial Resource Strain: Not on file  Food Insecurity: Not on file  Transportation Needs: Not on file  Physical Activity: Not on file  Stress: Not on file  Social Connections: Not on file  Intimate Partner Violence: Not on file   PHYSICAL EXAM  Vitals:   07/10/22 0935  BP: (!) 130/91  Pulse: (!) 102  Weight: 136 lb 8 oz (61.9 kg)  Height: 5\' 2"  (1.575 m)   Body mass index is 24.97 kg/m.  Generalized: Well developed, in no acute distress  Neurological examination  Mentation: Alert oriented to time, place, history taking. Follows all commands speech and language fluent Cranial nerve II-XII: Pupils were equal round reactive to light. Extraocular movements were full, visual field were full on confrontational test. Facial sensation and strength were normal. Head turning and shoulder shrug  were normal and symmetric. Motor: The motor testing reveals 5 over 5 strength of all 4 extremities. Good symmetric motor tone is noted throughout.  Sensory: Sensory testing is intact to soft touch on all 4 extremities. No evidence of extinction is noted.  Coordination: Cerebellar testing reveals good finger-nose-finger and heel-to-shin bilaterally.  Gait and station: Gait is normal.  Reflexes: Deep tendon reflexes are symmetric but increased throughout  DIAGNOSTIC DATA (LABS, IMAGING, TESTING) - I reviewed patient records, labs, notes, testing and imaging myself where available.  Lab Results  Component Value Date   WBC 7.2 02/13/2016   HGB 13.5 02/13/2016   HCT 40.7 02/13/2016   MCV 91.3 02/13/2016   PLT 369 02/13/2016      Component Value Date/Time   NA 141 02/13/2016 1603   K 4.4 02/13/2016 1603   CL 106 02/13/2016 1603   CO2 26 02/13/2016 1603   GLUCOSE 81 02/13/2016 1603   BUN 9 02/13/2016 1603   CREATININE 0.64 02/13/2016 1603   CALCIUM 9.6 02/13/2016 1603   PROT 7.1 02/13/2016 1603   ALBUMIN 4.3 02/13/2016 1603   AST 18 02/13/2016 1603   ALT 13 02/13/2016 1603   ALKPHOS  45 02/13/2016 1603   BILITOT 0.3 02/13/2016 1603   No results found for: "CHOL", "HDL", "LDLCALC", "LDLDIRECT", "TRIG", "CHOLHDL" No results found for: "  HGBA1C" No results found for: "VITAMINB12" Lab Results  Component Value Date   TSH 1.98 02/13/2016    Margie Ege, AGNP-C, DNP 07/10/2022, 9:55 AM [facility] [address], [address] [place], [place] [address] [phone]

## 2022-07-10 NOTE — Telephone Encounter (Signed)
Pharmacy Patient Advocate Encounter   Received notification from [employer] that prior authorization for Emgality /ML auto-injectors (migraine) is required/requested.   PA submitted on 07/10/2022 to (ins) [employer] [place] via [employer] or [facility]) confirmation # [id number] Status is pending

## 2022-07-10 NOTE — Telephone Encounter (Signed)
Pharmacy sent request for PA on emgality

## 2022-07-12 DIAGNOSIS — J3081 Allergic rhinitis due to animal (cat) (dog) hair and dander: Secondary | ICD-10-CM | POA: Diagnosis not present

## 2022-07-12 DIAGNOSIS — J301 Allergic rhinitis due to pollen: Secondary | ICD-10-CM | POA: Diagnosis not present

## 2022-07-12 DIAGNOSIS — J3089 Other allergic rhinitis: Secondary | ICD-10-CM | POA: Diagnosis not present

## 2022-07-16 ENCOUNTER — Other Ambulatory Visit (HOSPITAL_COMMUNITY): Payer: Self-pay

## 2022-07-16 NOTE — Telephone Encounter (Signed)
Pharmacy Patient Advocate Encounter  Prior Authorization for Emgality 120MG /ML auto-injectors (migraine) has been approved by [employer] (ins).    PA # N/A Effective dates: 07/11/2022 through 07/11/2023

## 2022-07-16 NOTE — Telephone Encounter (Signed)
Pharmacy Patient Advocate Encounter  Prior Authorization for Bernita Raisin 100MG  tablets has been approved by [employer] (ins).    PA # PA Case ID #: [id number] Effective dates: 07/11/2022 through 10/03/2022

## 2022-07-17 ENCOUNTER — Encounter: Payer: Self-pay | Admitting: Family Medicine

## 2022-07-17 ENCOUNTER — Ambulatory Visit: Payer: BC Managed Care – PPO | Admitting: Family Medicine

## 2022-07-17 VITALS — BP 118/82 | Ht 62.0 in | Wt 136.0 lb

## 2022-07-17 DIAGNOSIS — M533 Sacrococcygeal disorders, not elsewhere classified: Secondary | ICD-10-CM

## 2022-07-17 DIAGNOSIS — Q796 Ehlers-Danlos syndrome, unspecified: Secondary | ICD-10-CM | POA: Diagnosis not present

## 2022-07-17 NOTE — Telephone Encounter (Signed)
Called pt. LVM that Emgality was approved through insurance and she should be able to pick up from pharmacy.

## 2022-07-17 NOTE — Assessment & Plan Note (Signed)
Having more pain on the right side.  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 - Counseled on home exercise therapy and supportive care. -Counseled on compression. -Could consider injection

## 2022-07-17 NOTE — Assessment & Plan Note (Signed)
Beighton score 8/9.  She reports a multiple joint subluxations and has been in physical therapy for some time. -Counseled on home exercise therapy and supportive care.

## 2022-07-17 NOTE — Patient Instructions (Signed)
Nice to meet you Please try heat  Please consider the compression  You can consider body braid  You can check out Pamela Norton  Please send me a message in MyChart with any questions or updates.  Please see me back in 2 weeks.   --Dr. Jordan Likes

## 2022-07-17 NOTE — Progress Notes (Signed)
  Pamela Norton - 32 y.o. female MRN [id number]  Date of birth: 1990-06-20  SUBJECTIVE:  Including CC & ROS.  No chief complaint on file.   Pamela Norton is a 32 y.o. female that is presenting with questions about hypermobility.  She does suffer from shoulder pain as well as low back pain.  She also experiences periscapular pain in the midthoracic region.  Has had a surgery on her left wrist.  She suffers from multiple joint subluxations.  She also reports symptoms that suggest dysautonomia.    Review of Systems See HPI   HISTORY: Past Medical, Surgical, Social, and Family History Reviewed & Updated per EMR.   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  She reports a multiple joint subluxations and has been in physical therapy for some time. -Counseled on home exercise therapy and supportive care.   Sacroiliac joint dysfunction of right side Having more pain on the right side.  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 - Counseled on home exercise therapy and supportive care. -Counseled on compression. -Could consider injection

## 2022-07-27 DIAGNOSIS — J3081 Allergic rhinitis due to animal (cat) (dog) hair and dander: Secondary | ICD-10-CM | POA: Diagnosis not present

## 2022-07-27 DIAGNOSIS — J3089 Other allergic rhinitis: Secondary | ICD-10-CM | POA: Diagnosis not present

## 2022-07-27 DIAGNOSIS — J301 Allergic rhinitis due to pollen: Secondary | ICD-10-CM | POA: Diagnosis not present

## 2022-07-29 ENCOUNTER — Encounter: Payer: Self-pay | Admitting: Family Medicine

## 2022-07-30 ENCOUNTER — Other Ambulatory Visit: Payer: Self-pay | Admitting: Neurology

## 2022-08-02 DIAGNOSIS — J3089 Other allergic rhinitis: Secondary | ICD-10-CM | POA: Diagnosis not present

## 2022-08-02 DIAGNOSIS — J3081 Allergic rhinitis due to animal (cat) (dog) hair and dander: Secondary | ICD-10-CM | POA: Diagnosis not present

## 2022-08-02 DIAGNOSIS — D2262 Melanocytic nevi of left upper limb, including shoulder: Secondary | ICD-10-CM | POA: Diagnosis not present

## 2022-08-02 DIAGNOSIS — J301 Allergic rhinitis due to pollen: Secondary | ICD-10-CM | POA: Diagnosis not present

## 2022-08-03 DIAGNOSIS — L700 Acne vulgaris: Secondary | ICD-10-CM | POA: Diagnosis not present

## 2022-08-03 DIAGNOSIS — Z1283 Encounter for screening for malignant neoplasm of skin: Secondary | ICD-10-CM | POA: Diagnosis not present

## 2022-08-03 DIAGNOSIS — D225 Melanocytic nevi of trunk: Secondary | ICD-10-CM | POA: Diagnosis not present

## 2022-08-03 DIAGNOSIS — D485 Neoplasm of uncertain behavior of skin: Secondary | ICD-10-CM | POA: Diagnosis not present

## 2022-08-07 ENCOUNTER — Ambulatory Visit: Payer: BC Managed Care – PPO | Admitting: Family Medicine

## 2022-08-07 ENCOUNTER — Encounter: Payer: Self-pay | Admitting: Family Medicine

## 2022-08-07 VITALS — BP 120/82 | Ht 62.0 in | Wt 136.0 lb

## 2022-08-07 DIAGNOSIS — Q796 Ehlers-Danlos syndrome, unspecified: Secondary | ICD-10-CM | POA: Diagnosis not present

## 2022-08-07 MED ORDER — CELECOXIB 100 MG PO CAPS: 100.0000 mg | ORAL_CAPSULE | Freq: Two times a day (BID) | ORAL | 1 refills | Status: DC

## 2022-08-07 NOTE — Patient Instructions (Signed)
Good to see you Please try water therapy if you are stiff  Please try the baclofen at night  I have sent in the celebrex   Please send me a message in MyChart with any questions or updates.  Please see [place] back as needed.   --Dr. Jordan Likes

## 2022-08-07 NOTE — Assessment & Plan Note (Signed)
Acutely worsening.  Appears to have more full body pain at this moment and having soreness and stiffness in the morning.  She is having continued workup for her right shoulder. -Counseled on home exercise therapy and supportive care. - Counseled on baclofen. - Provided Celebrex - Could consider suprascapular nerve block for the right shoulder

## 2022-08-07 NOTE — Progress Notes (Signed)
  Pamela Norton - 32 y.o. female MRN [id number]  Date of birth: 1990/05/19  SUBJECTIVE:  Including CC & ROS.  No chief complaint on file.   Pamela Norton is a 32 y.o. female that is following up for her hypermobility.  She feels more stiff and sore and it lasted the course the day.  She is meeting with her doctor that is covering her shoulder pain tomorrow.    Review of Systems See HPI   HISTORY: Past Medical, Surgical, Social, and Family History Reviewed & Updated per EMR.   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  Appears to have more full body pain at this moment and having soreness and stiffness in the morning.  She is having continued workup for her right shoulder. -Counseled on home exercise therapy and supportive care. - Counseled on baclofen. - Provided Celebrex - Could consider suprascapular nerve block for the right shoulder

## 2022-08-08 DIAGNOSIS — M7521 Bicipital tendinitis, right shoulder: Secondary | ICD-10-CM | POA: Diagnosis not present

## 2022-08-10 DIAGNOSIS — J3081 Allergic rhinitis due to animal (cat) (dog) hair and dander: Secondary | ICD-10-CM | POA: Diagnosis not present

## 2022-08-10 DIAGNOSIS — J3089 Other allergic rhinitis: Secondary | ICD-10-CM | POA: Diagnosis not present

## 2022-08-10 DIAGNOSIS — J301 Allergic rhinitis due to pollen: Secondary | ICD-10-CM | POA: Diagnosis not present

## 2022-08-16 DIAGNOSIS — J3089 Other allergic rhinitis: Secondary | ICD-10-CM | POA: Diagnosis not present

## 2022-08-16 DIAGNOSIS — J3081 Allergic rhinitis due to animal (cat) (dog) hair and dander: Secondary | ICD-10-CM | POA: Diagnosis not present

## 2022-08-16 DIAGNOSIS — J301 Allergic rhinitis due to pollen: Secondary | ICD-10-CM | POA: Diagnosis not present

## 2022-08-23 DIAGNOSIS — M67813 Other specified disorders of tendon, right shoulder: Secondary | ICD-10-CM | POA: Diagnosis not present

## 2022-08-23 DIAGNOSIS — J301 Allergic rhinitis due to pollen: Secondary | ICD-10-CM | POA: Diagnosis not present

## 2022-08-23 DIAGNOSIS — J3081 Allergic rhinitis due to animal (cat) (dog) hair and dander: Secondary | ICD-10-CM | POA: Diagnosis not present

## 2022-08-23 DIAGNOSIS — J3089 Other allergic rhinitis: Secondary | ICD-10-CM | POA: Diagnosis not present

## 2022-08-23 DIAGNOSIS — J4540 Moderate persistent asthma, uncomplicated: Secondary | ICD-10-CM | POA: Diagnosis not present

## 2022-08-23 DIAGNOSIS — M7521 Bicipital tendinitis, right shoulder: Secondary | ICD-10-CM | POA: Diagnosis not present

## 2022-08-29 DIAGNOSIS — M7521 Bicipital tendinitis, right shoulder: Secondary | ICD-10-CM | POA: Diagnosis not present

## 2022-08-29 DIAGNOSIS — Q796 Ehlers-Danlos syndrome, unspecified: Secondary | ICD-10-CM | POA: Diagnosis not present

## 2022-08-30 DIAGNOSIS — J301 Allergic rhinitis due to pollen: Secondary | ICD-10-CM | POA: Diagnosis not present

## 2022-08-30 DIAGNOSIS — J3089 Other allergic rhinitis: Secondary | ICD-10-CM | POA: Diagnosis not present

## 2022-08-30 DIAGNOSIS — J3081 Allergic rhinitis due to animal (cat) (dog) hair and dander: Secondary | ICD-10-CM | POA: Diagnosis not present

## 2022-09-06 DIAGNOSIS — J3081 Allergic rhinitis due to animal (cat) (dog) hair and dander: Secondary | ICD-10-CM | POA: Diagnosis not present

## 2022-09-06 DIAGNOSIS — J301 Allergic rhinitis due to pollen: Secondary | ICD-10-CM | POA: Diagnosis not present

## 2022-09-06 DIAGNOSIS — J3089 Other allergic rhinitis: Secondary | ICD-10-CM | POA: Diagnosis not present

## 2022-09-11 DIAGNOSIS — M65811 Other synovitis and tenosynovitis, right shoulder: Secondary | ICD-10-CM | POA: Diagnosis not present

## 2022-09-11 DIAGNOSIS — M7521 Bicipital tendinitis, right shoulder: Secondary | ICD-10-CM | POA: Diagnosis not present

## 2022-09-11 DIAGNOSIS — G8918 Other acute postprocedural pain: Secondary | ICD-10-CM | POA: Diagnosis not present

## 2022-09-11 DIAGNOSIS — S43431A Superior glenoid labrum lesion of right shoulder, initial encounter: Secondary | ICD-10-CM | POA: Diagnosis not present

## 2022-09-25 ENCOUNTER — Other Ambulatory Visit: Payer: Self-pay | Admitting: Family Medicine

## 2022-09-25 DIAGNOSIS — M25411 Effusion, right shoulder: Secondary | ICD-10-CM | POA: Diagnosis not present

## 2022-09-25 DIAGNOSIS — Z4789 Encounter for other orthopedic aftercare: Secondary | ICD-10-CM | POA: Diagnosis not present

## 2022-09-25 DIAGNOSIS — M25511 Pain in right shoulder: Secondary | ICD-10-CM | POA: Diagnosis not present

## 2022-09-25 DIAGNOSIS — M7521 Bicipital tendinitis, right shoulder: Secondary | ICD-10-CM | POA: Diagnosis not present

## 2022-09-25 DIAGNOSIS — M25611 Stiffness of right shoulder, not elsewhere classified: Secondary | ICD-10-CM | POA: Diagnosis not present

## 2022-09-25 MED ORDER — CELECOXIB 100 MG PO CAPS: 100.0000 mg | ORAL_CAPSULE | Freq: Two times a day (BID) | ORAL | 2 refills | Status: AC

## 2022-09-25 NOTE — Progress Notes (Signed)
Refilling for dr. Jordan Likes.

## 2022-09-27 DIAGNOSIS — M25511 Pain in right shoulder: Secondary | ICD-10-CM | POA: Diagnosis not present

## 2022-09-27 DIAGNOSIS — M25411 Effusion, right shoulder: Secondary | ICD-10-CM | POA: Diagnosis not present

## 2022-09-27 DIAGNOSIS — Z4789 Encounter for other orthopedic aftercare: Secondary | ICD-10-CM | POA: Diagnosis not present

## 2022-09-27 DIAGNOSIS — M25611 Stiffness of right shoulder, not elsewhere classified: Secondary | ICD-10-CM | POA: Diagnosis not present

## 2022-10-01 ENCOUNTER — Other Ambulatory Visit: Payer: Self-pay | Admitting: Neurology

## 2022-10-02 DIAGNOSIS — M25411 Effusion, right shoulder: Secondary | ICD-10-CM | POA: Diagnosis not present

## 2022-10-02 DIAGNOSIS — M25611 Stiffness of right shoulder, not elsewhere classified: Secondary | ICD-10-CM | POA: Diagnosis not present

## 2022-10-02 DIAGNOSIS — Z4789 Encounter for other orthopedic aftercare: Secondary | ICD-10-CM | POA: Diagnosis not present

## 2022-10-02 DIAGNOSIS — M25511 Pain in right shoulder: Secondary | ICD-10-CM | POA: Diagnosis not present

## 2022-10-04 DIAGNOSIS — M25511 Pain in right shoulder: Secondary | ICD-10-CM | POA: Diagnosis not present

## 2022-10-04 DIAGNOSIS — Z4789 Encounter for other orthopedic aftercare: Secondary | ICD-10-CM | POA: Diagnosis not present

## 2022-10-04 DIAGNOSIS — M25611 Stiffness of right shoulder, not elsewhere classified: Secondary | ICD-10-CM | POA: Diagnosis not present

## 2022-10-04 DIAGNOSIS — M25411 Effusion, right shoulder: Secondary | ICD-10-CM | POA: Diagnosis not present

## 2022-10-05 ENCOUNTER — Other Ambulatory Visit (HOSPITAL_COMMUNITY): Payer: Self-pay

## 2022-10-05 ENCOUNTER — Telehealth: Payer: Self-pay | Admitting: Pharmacy Technician

## 2022-10-05 DIAGNOSIS — J3081 Allergic rhinitis due to animal (cat) (dog) hair and dander: Secondary | ICD-10-CM | POA: Diagnosis not present

## 2022-10-05 DIAGNOSIS — J3089 Other allergic rhinitis: Secondary | ICD-10-CM | POA: Diagnosis not present

## 2022-10-05 DIAGNOSIS — J301 Allergic rhinitis due to pollen: Secondary | ICD-10-CM | POA: Diagnosis not present

## 2022-10-05 NOTE — Telephone Encounter (Signed)
Pharmacy Patient Advocate Encounter   Received notification from CoverMyMeds that prior authorization for Ubrelvy 100MG  tablets is required/requested.   Insurance verification completed.   The patient is insured through [facility] .   Per test claim: PA started via CoverMyMeds. KEY [id number] . Waiting for clinical questions to populate.

## 2022-10-06 NOTE — Telephone Encounter (Signed)
Pharmacy Patient Advocate Encounter   Received notification from CoverMyMeds that prior authorization for Ubrelvy 100MG  tablets  is required/requested.   Insurance verification completed.   The patient is insured through [facility] .   Per test claim: PA submitted to [employer] via CoverMy[employer]eds Key/confirmation #/EOC [id number] Status is pending

## 2022-10-07 NOTE — Telephone Encounter (Signed)
Pharmacy Patient Advocate Encounter  Received notification from [facility] that Prior Authorization for TYSABRI (natalizumab) 300MG /15ML concentrate has been APPROVED from 10/06/2022 to 10/06/2023.Marland Kitchen  PA #/Case ID/Reference #: PA Case ID #: [id number]

## 2022-10-08 DIAGNOSIS — R7309 Other abnormal glucose: Secondary | ICD-10-CM | POA: Diagnosis not present

## 2022-10-08 DIAGNOSIS — Z0000 Encounter for general adult medical examination without abnormal findings: Secondary | ICD-10-CM | POA: Diagnosis not present

## 2022-10-08 DIAGNOSIS — R5382 Chronic fatigue, unspecified: Secondary | ICD-10-CM | POA: Diagnosis not present

## 2022-10-08 DIAGNOSIS — R7989 Other specified abnormal findings of blood chemistry: Secondary | ICD-10-CM | POA: Diagnosis not present

## 2022-10-09 DIAGNOSIS — Z4789 Encounter for other orthopedic aftercare: Secondary | ICD-10-CM | POA: Diagnosis not present

## 2022-10-09 DIAGNOSIS — M25611 Stiffness of right shoulder, not elsewhere classified: Secondary | ICD-10-CM | POA: Diagnosis not present

## 2022-10-09 DIAGNOSIS — M25511 Pain in right shoulder: Secondary | ICD-10-CM | POA: Diagnosis not present

## 2022-10-09 DIAGNOSIS — M25411 Effusion, right shoulder: Secondary | ICD-10-CM | POA: Diagnosis not present

## 2022-10-10 ENCOUNTER — Other Ambulatory Visit (HOSPITAL_COMMUNITY): Payer: Self-pay

## 2022-10-10 DIAGNOSIS — J301 Allergic rhinitis due to pollen: Secondary | ICD-10-CM | POA: Diagnosis not present

## 2022-10-10 DIAGNOSIS — J3081 Allergic rhinitis due to animal (cat) (dog) hair and dander: Secondary | ICD-10-CM | POA: Diagnosis not present

## 2022-10-10 DIAGNOSIS — J3089 Other allergic rhinitis: Secondary | ICD-10-CM | POA: Diagnosis not present

## 2022-10-12 DIAGNOSIS — M25611 Stiffness of right shoulder, not elsewhere classified: Secondary | ICD-10-CM | POA: Diagnosis not present

## 2022-10-12 DIAGNOSIS — J3089 Other allergic rhinitis: Secondary | ICD-10-CM | POA: Diagnosis not present

## 2022-10-12 DIAGNOSIS — Z4789 Encounter for other orthopedic aftercare: Secondary | ICD-10-CM | POA: Diagnosis not present

## 2022-10-12 DIAGNOSIS — M25511 Pain in right shoulder: Secondary | ICD-10-CM | POA: Diagnosis not present

## 2022-10-12 DIAGNOSIS — M25411 Effusion, right shoulder: Secondary | ICD-10-CM | POA: Diagnosis not present

## 2022-10-12 DIAGNOSIS — J301 Allergic rhinitis due to pollen: Secondary | ICD-10-CM | POA: Diagnosis not present

## 2022-10-12 DIAGNOSIS — J3081 Allergic rhinitis due to animal (cat) (dog) hair and dander: Secondary | ICD-10-CM | POA: Diagnosis not present

## 2022-10-15 DIAGNOSIS — J45909 Unspecified asthma, uncomplicated: Secondary | ICD-10-CM | POA: Diagnosis not present

## 2022-10-15 DIAGNOSIS — Z0000 Encounter for general adult medical examination without abnormal findings: Secondary | ICD-10-CM | POA: Diagnosis not present

## 2022-10-15 DIAGNOSIS — Z9109 Other allergy status, other than to drugs and biological substances: Secondary | ICD-10-CM | POA: Diagnosis not present

## 2022-10-15 DIAGNOSIS — R002 Palpitations: Secondary | ICD-10-CM | POA: Diagnosis not present

## 2022-10-16 DIAGNOSIS — M25611 Stiffness of right shoulder, not elsewhere classified: Secondary | ICD-10-CM | POA: Diagnosis not present

## 2022-10-16 DIAGNOSIS — Z4789 Encounter for other orthopedic aftercare: Secondary | ICD-10-CM | POA: Diagnosis not present

## 2022-10-16 DIAGNOSIS — M25411 Effusion, right shoulder: Secondary | ICD-10-CM | POA: Diagnosis not present

## 2022-10-16 DIAGNOSIS — M25511 Pain in right shoulder: Secondary | ICD-10-CM | POA: Diagnosis not present

## 2022-10-18 DIAGNOSIS — M25511 Pain in right shoulder: Secondary | ICD-10-CM | POA: Diagnosis not present

## 2022-10-18 DIAGNOSIS — M25411 Effusion, right shoulder: Secondary | ICD-10-CM | POA: Diagnosis not present

## 2022-10-18 DIAGNOSIS — M25611 Stiffness of right shoulder, not elsewhere classified: Secondary | ICD-10-CM | POA: Diagnosis not present

## 2022-10-18 DIAGNOSIS — Z4789 Encounter for other orthopedic aftercare: Secondary | ICD-10-CM | POA: Diagnosis not present

## 2022-10-19 DIAGNOSIS — J3089 Other allergic rhinitis: Secondary | ICD-10-CM | POA: Diagnosis not present

## 2022-10-19 DIAGNOSIS — J3081 Allergic rhinitis due to animal (cat) (dog) hair and dander: Secondary | ICD-10-CM | POA: Diagnosis not present

## 2022-10-19 DIAGNOSIS — J301 Allergic rhinitis due to pollen: Secondary | ICD-10-CM | POA: Diagnosis not present

## 2022-10-23 DIAGNOSIS — M7521 Bicipital tendinitis, right shoulder: Secondary | ICD-10-CM | POA: Diagnosis not present

## 2022-10-24 DIAGNOSIS — M25411 Effusion, right shoulder: Secondary | ICD-10-CM | POA: Diagnosis not present

## 2022-10-24 DIAGNOSIS — Z4789 Encounter for other orthopedic aftercare: Secondary | ICD-10-CM | POA: Diagnosis not present

## 2022-10-24 DIAGNOSIS — M25511 Pain in right shoulder: Secondary | ICD-10-CM | POA: Diagnosis not present

## 2022-10-24 DIAGNOSIS — M25611 Stiffness of right shoulder, not elsewhere classified: Secondary | ICD-10-CM | POA: Diagnosis not present

## 2022-10-26 DIAGNOSIS — J3081 Allergic rhinitis due to animal (cat) (dog) hair and dander: Secondary | ICD-10-CM | POA: Diagnosis not present

## 2022-10-26 DIAGNOSIS — Z4789 Encounter for other orthopedic aftercare: Secondary | ICD-10-CM | POA: Diagnosis not present

## 2022-10-26 DIAGNOSIS — M25411 Effusion, right shoulder: Secondary | ICD-10-CM | POA: Diagnosis not present

## 2022-10-26 DIAGNOSIS — J3089 Other allergic rhinitis: Secondary | ICD-10-CM | POA: Diagnosis not present

## 2022-10-26 DIAGNOSIS — J301 Allergic rhinitis due to pollen: Secondary | ICD-10-CM | POA: Diagnosis not present

## 2022-10-26 DIAGNOSIS — M25611 Stiffness of right shoulder, not elsewhere classified: Secondary | ICD-10-CM | POA: Diagnosis not present

## 2022-10-26 DIAGNOSIS — M25511 Pain in right shoulder: Secondary | ICD-10-CM | POA: Diagnosis not present

## 2022-10-30 DIAGNOSIS — M25511 Pain in right shoulder: Secondary | ICD-10-CM | POA: Diagnosis not present

## 2022-10-30 DIAGNOSIS — Z4789 Encounter for other orthopedic aftercare: Secondary | ICD-10-CM | POA: Diagnosis not present

## 2022-10-30 DIAGNOSIS — M25411 Effusion, right shoulder: Secondary | ICD-10-CM | POA: Diagnosis not present

## 2022-10-30 DIAGNOSIS — M25611 Stiffness of right shoulder, not elsewhere classified: Secondary | ICD-10-CM | POA: Diagnosis not present

## 2022-11-01 DIAGNOSIS — Z4789 Encounter for other orthopedic aftercare: Secondary | ICD-10-CM | POA: Diagnosis not present

## 2022-11-01 DIAGNOSIS — M25511 Pain in right shoulder: Secondary | ICD-10-CM | POA: Diagnosis not present

## 2022-11-01 DIAGNOSIS — M25611 Stiffness of right shoulder, not elsewhere classified: Secondary | ICD-10-CM | POA: Diagnosis not present

## 2022-11-01 DIAGNOSIS — M25411 Effusion, right shoulder: Secondary | ICD-10-CM | POA: Diagnosis not present

## 2022-11-02 DIAGNOSIS — J301 Allergic rhinitis due to pollen: Secondary | ICD-10-CM | POA: Diagnosis not present

## 2022-11-02 DIAGNOSIS — J3081 Allergic rhinitis due to animal (cat) (dog) hair and dander: Secondary | ICD-10-CM | POA: Diagnosis not present

## 2022-11-02 DIAGNOSIS — L905 Scar conditions and fibrosis of skin: Secondary | ICD-10-CM | POA: Diagnosis not present

## 2022-11-02 DIAGNOSIS — J3089 Other allergic rhinitis: Secondary | ICD-10-CM | POA: Diagnosis not present

## 2022-11-02 DIAGNOSIS — Z1283 Encounter for screening for malignant neoplasm of skin: Secondary | ICD-10-CM | POA: Diagnosis not present

## 2022-11-02 DIAGNOSIS — D225 Melanocytic nevi of trunk: Secondary | ICD-10-CM | POA: Diagnosis not present

## 2022-11-06 ENCOUNTER — Encounter: Payer: Self-pay | Admitting: Neurology

## 2022-11-06 DIAGNOSIS — M25411 Effusion, right shoulder: Secondary | ICD-10-CM | POA: Diagnosis not present

## 2022-11-06 DIAGNOSIS — M25611 Stiffness of right shoulder, not elsewhere classified: Secondary | ICD-10-CM | POA: Diagnosis not present

## 2022-11-06 DIAGNOSIS — M25511 Pain in right shoulder: Secondary | ICD-10-CM | POA: Diagnosis not present

## 2022-11-06 DIAGNOSIS — Z4789 Encounter for other orthopedic aftercare: Secondary | ICD-10-CM | POA: Diagnosis not present

## 2022-11-08 ENCOUNTER — Telehealth: Payer: Self-pay

## 2022-11-08 ENCOUNTER — Other Ambulatory Visit (HOSPITAL_COMMUNITY): Payer: Self-pay

## 2022-11-08 DIAGNOSIS — M25511 Pain in right shoulder: Secondary | ICD-10-CM | POA: Diagnosis not present

## 2022-11-08 DIAGNOSIS — M25411 Effusion, right shoulder: Secondary | ICD-10-CM | POA: Diagnosis not present

## 2022-11-08 DIAGNOSIS — Z4789 Encounter for other orthopedic aftercare: Secondary | ICD-10-CM | POA: Diagnosis not present

## 2022-11-08 DIAGNOSIS — M25611 Stiffness of right shoulder, not elsewhere classified: Secondary | ICD-10-CM | POA: Diagnosis not present

## 2022-11-08 NOTE — Telephone Encounter (Signed)
Pharmacy Patient Advocate Encounter   Received notification from Patient Advice Request messages that prior authorization for Emgality 120MG /ML auto-injectors (migraine) is required/requested.   Insurance verification completed.   The patient is insured through  [employer]  .   Per test claim: PA required; PA submitted to [employer] via CoverMyMeds Key/confirmation #/EOC [id number] Status is pending

## 2022-11-09 DIAGNOSIS — J301 Allergic rhinitis due to pollen: Secondary | ICD-10-CM | POA: Diagnosis not present

## 2022-11-09 DIAGNOSIS — J3081 Allergic rhinitis due to animal (cat) (dog) hair and dander: Secondary | ICD-10-CM | POA: Diagnosis not present

## 2022-11-09 DIAGNOSIS — J3089 Other allergic rhinitis: Secondary | ICD-10-CM | POA: Diagnosis not present

## 2022-11-13 DIAGNOSIS — M25411 Effusion, right shoulder: Secondary | ICD-10-CM | POA: Diagnosis not present

## 2022-11-13 DIAGNOSIS — Z4789 Encounter for other orthopedic aftercare: Secondary | ICD-10-CM | POA: Diagnosis not present

## 2022-11-13 DIAGNOSIS — M25511 Pain in right shoulder: Secondary | ICD-10-CM | POA: Diagnosis not present

## 2022-11-13 DIAGNOSIS — M25611 Stiffness of right shoulder, not elsewhere classified: Secondary | ICD-10-CM | POA: Diagnosis not present

## 2022-11-14 ENCOUNTER — Other Ambulatory Visit (HOSPITAL_COMMUNITY): Payer: Self-pay

## 2022-11-14 DIAGNOSIS — J3089 Other allergic rhinitis: Secondary | ICD-10-CM | POA: Diagnosis not present

## 2022-11-14 DIAGNOSIS — J301 Allergic rhinitis due to pollen: Secondary | ICD-10-CM | POA: Diagnosis not present

## 2022-11-14 DIAGNOSIS — J3081 Allergic rhinitis due to animal (cat) (dog) hair and dander: Secondary | ICD-10-CM | POA: Diagnosis not present

## 2022-11-14 NOTE — Telephone Encounter (Signed)
Pharmacy Patient Advocate Encounter  Received notification from  [facility]  that Prior Authorization for Emgality 120MG /ML auto-injectors (migraine) has been APPROVED from 11/13/2022 to 11/13/2023. Ran test claim, Copay is $35.00. This test claim was processed through [facility]- copay amounts may vary at other pharmacies due to pharmacy/plan contracts, or as the patient moves through the different stages of their insurance plan.   PA #/Case ID/Reference #: PA Case: [id number]

## 2022-11-15 DIAGNOSIS — M25611 Stiffness of right shoulder, not elsewhere classified: Secondary | ICD-10-CM | POA: Diagnosis not present

## 2022-11-15 DIAGNOSIS — M25511 Pain in right shoulder: Secondary | ICD-10-CM | POA: Diagnosis not present

## 2022-11-15 DIAGNOSIS — M25411 Effusion, right shoulder: Secondary | ICD-10-CM | POA: Diagnosis not present

## 2022-11-15 DIAGNOSIS — Z4789 Encounter for other orthopedic aftercare: Secondary | ICD-10-CM | POA: Diagnosis not present

## 2022-11-20 DIAGNOSIS — M7501 Adhesive capsulitis of right shoulder: Secondary | ICD-10-CM | POA: Diagnosis not present

## 2022-11-20 DIAGNOSIS — Z4789 Encounter for other orthopedic aftercare: Secondary | ICD-10-CM | POA: Diagnosis not present

## 2022-11-20 DIAGNOSIS — M25511 Pain in right shoulder: Secondary | ICD-10-CM | POA: Diagnosis not present

## 2022-11-20 DIAGNOSIS — M25411 Effusion, right shoulder: Secondary | ICD-10-CM | POA: Diagnosis not present

## 2022-11-20 DIAGNOSIS — M25611 Stiffness of right shoulder, not elsewhere classified: Secondary | ICD-10-CM | POA: Diagnosis not present

## 2022-11-20 DIAGNOSIS — Z09 Encounter for follow-up examination after completed treatment for conditions other than malignant neoplasm: Secondary | ICD-10-CM | POA: Diagnosis not present

## 2022-11-22 DIAGNOSIS — M25411 Effusion, right shoulder: Secondary | ICD-10-CM | POA: Diagnosis not present

## 2022-11-22 DIAGNOSIS — M25611 Stiffness of right shoulder, not elsewhere classified: Secondary | ICD-10-CM | POA: Diagnosis not present

## 2022-11-22 DIAGNOSIS — Z4789 Encounter for other orthopedic aftercare: Secondary | ICD-10-CM | POA: Diagnosis not present

## 2022-11-22 DIAGNOSIS — M25511 Pain in right shoulder: Secondary | ICD-10-CM | POA: Diagnosis not present

## 2022-11-23 DIAGNOSIS — J3081 Allergic rhinitis due to animal (cat) (dog) hair and dander: Secondary | ICD-10-CM | POA: Diagnosis not present

## 2022-11-23 DIAGNOSIS — J3089 Other allergic rhinitis: Secondary | ICD-10-CM | POA: Diagnosis not present

## 2022-11-23 DIAGNOSIS — J301 Allergic rhinitis due to pollen: Secondary | ICD-10-CM | POA: Diagnosis not present

## 2022-11-27 DIAGNOSIS — Z4789 Encounter for other orthopedic aftercare: Secondary | ICD-10-CM | POA: Diagnosis not present

## 2022-11-27 DIAGNOSIS — M25411 Effusion, right shoulder: Secondary | ICD-10-CM | POA: Diagnosis not present

## 2022-11-27 DIAGNOSIS — M25511 Pain in right shoulder: Secondary | ICD-10-CM | POA: Diagnosis not present

## 2022-11-27 DIAGNOSIS — M25611 Stiffness of right shoulder, not elsewhere classified: Secondary | ICD-10-CM | POA: Diagnosis not present

## 2022-11-29 DIAGNOSIS — M25411 Effusion, right shoulder: Secondary | ICD-10-CM | POA: Diagnosis not present

## 2022-11-29 DIAGNOSIS — Z4789 Encounter for other orthopedic aftercare: Secondary | ICD-10-CM | POA: Diagnosis not present

## 2022-11-29 DIAGNOSIS — M25511 Pain in right shoulder: Secondary | ICD-10-CM | POA: Diagnosis not present

## 2022-11-29 DIAGNOSIS — M25611 Stiffness of right shoulder, not elsewhere classified: Secondary | ICD-10-CM | POA: Diagnosis not present

## 2022-11-30 DIAGNOSIS — J301 Allergic rhinitis due to pollen: Secondary | ICD-10-CM | POA: Diagnosis not present

## 2022-11-30 DIAGNOSIS — J3089 Other allergic rhinitis: Secondary | ICD-10-CM | POA: Diagnosis not present

## 2022-11-30 DIAGNOSIS — J3081 Allergic rhinitis due to animal (cat) (dog) hair and dander: Secondary | ICD-10-CM | POA: Diagnosis not present

## 2022-12-04 DIAGNOSIS — M25411 Effusion, right shoulder: Secondary | ICD-10-CM | POA: Diagnosis not present

## 2022-12-04 DIAGNOSIS — M25511 Pain in right shoulder: Secondary | ICD-10-CM | POA: Diagnosis not present

## 2022-12-04 DIAGNOSIS — M25611 Stiffness of right shoulder, not elsewhere classified: Secondary | ICD-10-CM | POA: Diagnosis not present

## 2022-12-04 DIAGNOSIS — Z4789 Encounter for other orthopedic aftercare: Secondary | ICD-10-CM | POA: Diagnosis not present

## 2022-12-05 DIAGNOSIS — M7521 Bicipital tendinitis, right shoulder: Secondary | ICD-10-CM | POA: Diagnosis not present

## 2022-12-06 ENCOUNTER — Other Ambulatory Visit: Payer: Self-pay | Admitting: Family Medicine

## 2022-12-06 DIAGNOSIS — Z4789 Encounter for other orthopedic aftercare: Secondary | ICD-10-CM | POA: Diagnosis not present

## 2022-12-06 DIAGNOSIS — M25611 Stiffness of right shoulder, not elsewhere classified: Secondary | ICD-10-CM | POA: Diagnosis not present

## 2022-12-06 DIAGNOSIS — M25411 Effusion, right shoulder: Secondary | ICD-10-CM | POA: Diagnosis not present

## 2022-12-06 DIAGNOSIS — M25511 Pain in right shoulder: Secondary | ICD-10-CM | POA: Diagnosis not present

## 2022-12-07 DIAGNOSIS — J3081 Allergic rhinitis due to animal (cat) (dog) hair and dander: Secondary | ICD-10-CM | POA: Diagnosis not present

## 2022-12-07 DIAGNOSIS — J3089 Other allergic rhinitis: Secondary | ICD-10-CM | POA: Diagnosis not present

## 2022-12-07 DIAGNOSIS — J301 Allergic rhinitis due to pollen: Secondary | ICD-10-CM | POA: Diagnosis not present

## 2022-12-11 DIAGNOSIS — M25611 Stiffness of right shoulder, not elsewhere classified: Secondary | ICD-10-CM | POA: Diagnosis not present

## 2022-12-11 DIAGNOSIS — Z4789 Encounter for other orthopedic aftercare: Secondary | ICD-10-CM | POA: Diagnosis not present

## 2022-12-11 DIAGNOSIS — M25411 Effusion, right shoulder: Secondary | ICD-10-CM | POA: Diagnosis not present

## 2022-12-11 DIAGNOSIS — M25511 Pain in right shoulder: Secondary | ICD-10-CM | POA: Diagnosis not present

## 2022-12-13 DIAGNOSIS — M25511 Pain in right shoulder: Secondary | ICD-10-CM | POA: Diagnosis not present

## 2022-12-13 DIAGNOSIS — M25611 Stiffness of right shoulder, not elsewhere classified: Secondary | ICD-10-CM | POA: Diagnosis not present

## 2022-12-13 DIAGNOSIS — Z4789 Encounter for other orthopedic aftercare: Secondary | ICD-10-CM | POA: Diagnosis not present

## 2022-12-13 DIAGNOSIS — M25411 Effusion, right shoulder: Secondary | ICD-10-CM | POA: Diagnosis not present

## 2022-12-14 DIAGNOSIS — J3081 Allergic rhinitis due to animal (cat) (dog) hair and dander: Secondary | ICD-10-CM | POA: Diagnosis not present

## 2022-12-14 DIAGNOSIS — J301 Allergic rhinitis due to pollen: Secondary | ICD-10-CM | POA: Diagnosis not present

## 2022-12-14 DIAGNOSIS — J3089 Other allergic rhinitis: Secondary | ICD-10-CM | POA: Diagnosis not present

## 2022-12-18 DIAGNOSIS — M25611 Stiffness of right shoulder, not elsewhere classified: Secondary | ICD-10-CM | POA: Diagnosis not present

## 2022-12-18 DIAGNOSIS — M25411 Effusion, right shoulder: Secondary | ICD-10-CM | POA: Diagnosis not present

## 2022-12-18 DIAGNOSIS — M25511 Pain in right shoulder: Secondary | ICD-10-CM | POA: Diagnosis not present

## 2022-12-18 DIAGNOSIS — Z4789 Encounter for other orthopedic aftercare: Secondary | ICD-10-CM | POA: Diagnosis not present

## 2022-12-20 DIAGNOSIS — M25411 Effusion, right shoulder: Secondary | ICD-10-CM | POA: Diagnosis not present

## 2022-12-20 DIAGNOSIS — Z4789 Encounter for other orthopedic aftercare: Secondary | ICD-10-CM | POA: Diagnosis not present

## 2022-12-20 DIAGNOSIS — M25611 Stiffness of right shoulder, not elsewhere classified: Secondary | ICD-10-CM | POA: Diagnosis not present

## 2022-12-20 DIAGNOSIS — M25511 Pain in right shoulder: Secondary | ICD-10-CM | POA: Diagnosis not present

## 2022-12-21 DIAGNOSIS — J3081 Allergic rhinitis due to animal (cat) (dog) hair and dander: Secondary | ICD-10-CM | POA: Diagnosis not present

## 2022-12-21 DIAGNOSIS — J3089 Other allergic rhinitis: Secondary | ICD-10-CM | POA: Diagnosis not present

## 2022-12-21 DIAGNOSIS — J301 Allergic rhinitis due to pollen: Secondary | ICD-10-CM | POA: Diagnosis not present

## 2022-12-25 DIAGNOSIS — M25411 Effusion, right shoulder: Secondary | ICD-10-CM | POA: Diagnosis not present

## 2022-12-25 DIAGNOSIS — M25611 Stiffness of right shoulder, not elsewhere classified: Secondary | ICD-10-CM | POA: Diagnosis not present

## 2022-12-25 DIAGNOSIS — M25511 Pain in right shoulder: Secondary | ICD-10-CM | POA: Diagnosis not present

## 2022-12-25 DIAGNOSIS — Z4789 Encounter for other orthopedic aftercare: Secondary | ICD-10-CM | POA: Diagnosis not present

## 2022-12-27 DIAGNOSIS — M25411 Effusion, right shoulder: Secondary | ICD-10-CM | POA: Diagnosis not present

## 2022-12-27 DIAGNOSIS — M25511 Pain in right shoulder: Secondary | ICD-10-CM | POA: Diagnosis not present

## 2022-12-27 DIAGNOSIS — Z4789 Encounter for other orthopedic aftercare: Secondary | ICD-10-CM | POA: Diagnosis not present

## 2022-12-27 DIAGNOSIS — M25611 Stiffness of right shoulder, not elsewhere classified: Secondary | ICD-10-CM | POA: Diagnosis not present

## 2022-12-28 DIAGNOSIS — J3089 Other allergic rhinitis: Secondary | ICD-10-CM | POA: Diagnosis not present

## 2022-12-28 DIAGNOSIS — J3081 Allergic rhinitis due to animal (cat) (dog) hair and dander: Secondary | ICD-10-CM | POA: Diagnosis not present

## 2022-12-28 DIAGNOSIS — J301 Allergic rhinitis due to pollen: Secondary | ICD-10-CM | POA: Diagnosis not present

## 2023-01-01 DIAGNOSIS — M25411 Effusion, right shoulder: Secondary | ICD-10-CM | POA: Diagnosis not present

## 2023-01-01 DIAGNOSIS — Z4789 Encounter for other orthopedic aftercare: Secondary | ICD-10-CM | POA: Diagnosis not present

## 2023-01-01 DIAGNOSIS — M25511 Pain in right shoulder: Secondary | ICD-10-CM | POA: Diagnosis not present

## 2023-01-01 DIAGNOSIS — M25611 Stiffness of right shoulder, not elsewhere classified: Secondary | ICD-10-CM | POA: Diagnosis not present

## 2023-01-03 DIAGNOSIS — M25611 Stiffness of right shoulder, not elsewhere classified: Secondary | ICD-10-CM | POA: Diagnosis not present

## 2023-01-03 DIAGNOSIS — M25411 Effusion, right shoulder: Secondary | ICD-10-CM | POA: Diagnosis not present

## 2023-01-03 DIAGNOSIS — M25511 Pain in right shoulder: Secondary | ICD-10-CM | POA: Diagnosis not present

## 2023-01-03 DIAGNOSIS — Z4789 Encounter for other orthopedic aftercare: Secondary | ICD-10-CM | POA: Diagnosis not present

## 2023-01-04 DIAGNOSIS — J3081 Allergic rhinitis due to animal (cat) (dog) hair and dander: Secondary | ICD-10-CM | POA: Diagnosis not present

## 2023-01-04 DIAGNOSIS — J301 Allergic rhinitis due to pollen: Secondary | ICD-10-CM | POA: Diagnosis not present

## 2023-01-04 DIAGNOSIS — J3089 Other allergic rhinitis: Secondary | ICD-10-CM | POA: Diagnosis not present

## 2023-01-07 DIAGNOSIS — M25512 Pain in left shoulder: Secondary | ICD-10-CM | POA: Diagnosis not present

## 2023-01-08 DIAGNOSIS — M25611 Stiffness of right shoulder, not elsewhere classified: Secondary | ICD-10-CM | POA: Diagnosis not present

## 2023-01-08 DIAGNOSIS — M25511 Pain in right shoulder: Secondary | ICD-10-CM | POA: Diagnosis not present

## 2023-01-08 DIAGNOSIS — M25411 Effusion, right shoulder: Secondary | ICD-10-CM | POA: Diagnosis not present

## 2023-01-08 DIAGNOSIS — Z4789 Encounter for other orthopedic aftercare: Secondary | ICD-10-CM | POA: Diagnosis not present

## 2023-01-10 DIAGNOSIS — M25611 Stiffness of right shoulder, not elsewhere classified: Secondary | ICD-10-CM | POA: Diagnosis not present

## 2023-01-10 DIAGNOSIS — Z4789 Encounter for other orthopedic aftercare: Secondary | ICD-10-CM | POA: Diagnosis not present

## 2023-01-10 DIAGNOSIS — M25511 Pain in right shoulder: Secondary | ICD-10-CM | POA: Diagnosis not present

## 2023-01-10 DIAGNOSIS — M25411 Effusion, right shoulder: Secondary | ICD-10-CM | POA: Diagnosis not present

## 2023-01-11 DIAGNOSIS — J3081 Allergic rhinitis due to animal (cat) (dog) hair and dander: Secondary | ICD-10-CM | POA: Diagnosis not present

## 2023-01-11 DIAGNOSIS — J3089 Other allergic rhinitis: Secondary | ICD-10-CM | POA: Diagnosis not present

## 2023-01-11 DIAGNOSIS — J301 Allergic rhinitis due to pollen: Secondary | ICD-10-CM | POA: Diagnosis not present

## 2023-01-15 DIAGNOSIS — M7522 Bicipital tendinitis, left shoulder: Secondary | ICD-10-CM | POA: Diagnosis not present

## 2023-01-15 DIAGNOSIS — M7582 Other shoulder lesions, left shoulder: Secondary | ICD-10-CM | POA: Diagnosis not present

## 2023-01-15 DIAGNOSIS — M25511 Pain in right shoulder: Secondary | ICD-10-CM | POA: Diagnosis not present

## 2023-01-15 DIAGNOSIS — Z4789 Encounter for other orthopedic aftercare: Secondary | ICD-10-CM | POA: Diagnosis not present

## 2023-01-15 DIAGNOSIS — M25411 Effusion, right shoulder: Secondary | ICD-10-CM | POA: Diagnosis not present

## 2023-01-15 DIAGNOSIS — M129 Arthropathy, unspecified: Secondary | ICD-10-CM | POA: Diagnosis not present

## 2023-01-15 DIAGNOSIS — M67814 Other specified disorders of tendon, left shoulder: Secondary | ICD-10-CM | POA: Diagnosis not present

## 2023-01-15 DIAGNOSIS — M25512 Pain in left shoulder: Secondary | ICD-10-CM | POA: Diagnosis not present

## 2023-01-15 DIAGNOSIS — M25611 Stiffness of right shoulder, not elsewhere classified: Secondary | ICD-10-CM | POA: Diagnosis not present

## 2023-01-15 DIAGNOSIS — M7521 Bicipital tendinitis, right shoulder: Secondary | ICD-10-CM | POA: Diagnosis not present

## 2023-01-17 DIAGNOSIS — M25611 Stiffness of right shoulder, not elsewhere classified: Secondary | ICD-10-CM | POA: Diagnosis not present

## 2023-01-17 DIAGNOSIS — M25511 Pain in right shoulder: Secondary | ICD-10-CM | POA: Diagnosis not present

## 2023-01-17 DIAGNOSIS — Z4789 Encounter for other orthopedic aftercare: Secondary | ICD-10-CM | POA: Diagnosis not present

## 2023-01-17 DIAGNOSIS — M25411 Effusion, right shoulder: Secondary | ICD-10-CM | POA: Diagnosis not present

## 2023-01-18 DIAGNOSIS — J301 Allergic rhinitis due to pollen: Secondary | ICD-10-CM | POA: Diagnosis not present

## 2023-01-18 DIAGNOSIS — J3081 Allergic rhinitis due to animal (cat) (dog) hair and dander: Secondary | ICD-10-CM | POA: Diagnosis not present

## 2023-01-18 DIAGNOSIS — J3089 Other allergic rhinitis: Secondary | ICD-10-CM | POA: Diagnosis not present

## 2023-01-21 DIAGNOSIS — M25511 Pain in right shoulder: Secondary | ICD-10-CM | POA: Diagnosis not present

## 2023-01-21 DIAGNOSIS — M25411 Effusion, right shoulder: Secondary | ICD-10-CM | POA: Diagnosis not present

## 2023-01-21 DIAGNOSIS — Z4789 Encounter for other orthopedic aftercare: Secondary | ICD-10-CM | POA: Diagnosis not present

## 2023-01-21 DIAGNOSIS — M25611 Stiffness of right shoulder, not elsewhere classified: Secondary | ICD-10-CM | POA: Diagnosis not present

## 2023-01-23 DIAGNOSIS — M25411 Effusion, right shoulder: Secondary | ICD-10-CM | POA: Diagnosis not present

## 2023-01-23 DIAGNOSIS — M25611 Stiffness of right shoulder, not elsewhere classified: Secondary | ICD-10-CM | POA: Diagnosis not present

## 2023-01-23 DIAGNOSIS — M25511 Pain in right shoulder: Secondary | ICD-10-CM | POA: Diagnosis not present

## 2023-01-23 DIAGNOSIS — Z4789 Encounter for other orthopedic aftercare: Secondary | ICD-10-CM | POA: Diagnosis not present

## 2023-01-25 DIAGNOSIS — J3089 Other allergic rhinitis: Secondary | ICD-10-CM | POA: Diagnosis not present

## 2023-01-25 DIAGNOSIS — J3081 Allergic rhinitis due to animal (cat) (dog) hair and dander: Secondary | ICD-10-CM | POA: Diagnosis not present

## 2023-01-25 DIAGNOSIS — J301 Allergic rhinitis due to pollen: Secondary | ICD-10-CM | POA: Diagnosis not present

## 2023-01-29 DIAGNOSIS — M25611 Stiffness of right shoulder, not elsewhere classified: Secondary | ICD-10-CM | POA: Diagnosis not present

## 2023-01-29 DIAGNOSIS — M25511 Pain in right shoulder: Secondary | ICD-10-CM | POA: Diagnosis not present

## 2023-01-29 DIAGNOSIS — M25411 Effusion, right shoulder: Secondary | ICD-10-CM | POA: Diagnosis not present

## 2023-01-29 DIAGNOSIS — Z4789 Encounter for other orthopedic aftercare: Secondary | ICD-10-CM | POA: Diagnosis not present

## 2023-01-31 DIAGNOSIS — M25611 Stiffness of right shoulder, not elsewhere classified: Secondary | ICD-10-CM | POA: Diagnosis not present

## 2023-01-31 DIAGNOSIS — M25411 Effusion, right shoulder: Secondary | ICD-10-CM | POA: Diagnosis not present

## 2023-01-31 DIAGNOSIS — Z4789 Encounter for other orthopedic aftercare: Secondary | ICD-10-CM | POA: Diagnosis not present

## 2023-01-31 DIAGNOSIS — M25511 Pain in right shoulder: Secondary | ICD-10-CM | POA: Diagnosis not present

## 2023-02-01 DIAGNOSIS — J3089 Other allergic rhinitis: Secondary | ICD-10-CM | POA: Diagnosis not present

## 2023-02-01 DIAGNOSIS — J301 Allergic rhinitis due to pollen: Secondary | ICD-10-CM | POA: Diagnosis not present

## 2023-02-01 DIAGNOSIS — J3081 Allergic rhinitis due to animal (cat) (dog) hair and dander: Secondary | ICD-10-CM | POA: Diagnosis not present

## 2023-02-04 DIAGNOSIS — M7521 Bicipital tendinitis, right shoulder: Secondary | ICD-10-CM | POA: Diagnosis not present

## 2023-02-05 DIAGNOSIS — M25411 Effusion, right shoulder: Secondary | ICD-10-CM | POA: Diagnosis not present

## 2023-02-05 DIAGNOSIS — M25611 Stiffness of right shoulder, not elsewhere classified: Secondary | ICD-10-CM | POA: Diagnosis not present

## 2023-02-05 DIAGNOSIS — Z4789 Encounter for other orthopedic aftercare: Secondary | ICD-10-CM | POA: Diagnosis not present

## 2023-02-05 DIAGNOSIS — M25511 Pain in right shoulder: Secondary | ICD-10-CM | POA: Diagnosis not present

## 2023-02-06 DIAGNOSIS — M25511 Pain in right shoulder: Secondary | ICD-10-CM | POA: Diagnosis not present

## 2023-02-08 DIAGNOSIS — J301 Allergic rhinitis due to pollen: Secondary | ICD-10-CM | POA: Diagnosis not present

## 2023-02-08 DIAGNOSIS — J3089 Other allergic rhinitis: Secondary | ICD-10-CM | POA: Diagnosis not present

## 2023-02-08 DIAGNOSIS — J3081 Allergic rhinitis due to animal (cat) (dog) hair and dander: Secondary | ICD-10-CM | POA: Diagnosis not present

## 2023-02-10 DIAGNOSIS — M25411 Effusion, right shoulder: Secondary | ICD-10-CM | POA: Diagnosis not present

## 2023-02-10 DIAGNOSIS — M25511 Pain in right shoulder: Secondary | ICD-10-CM | POA: Diagnosis not present

## 2023-02-10 DIAGNOSIS — M25611 Stiffness of right shoulder, not elsewhere classified: Secondary | ICD-10-CM | POA: Diagnosis not present

## 2023-02-10 DIAGNOSIS — Z4789 Encounter for other orthopedic aftercare: Secondary | ICD-10-CM | POA: Diagnosis not present

## 2023-02-13 DIAGNOSIS — J301 Allergic rhinitis due to pollen: Secondary | ICD-10-CM | POA: Diagnosis not present

## 2023-02-13 DIAGNOSIS — M25611 Stiffness of right shoulder, not elsewhere classified: Secondary | ICD-10-CM | POA: Diagnosis not present

## 2023-02-13 DIAGNOSIS — J3089 Other allergic rhinitis: Secondary | ICD-10-CM | POA: Diagnosis not present

## 2023-02-13 DIAGNOSIS — M25411 Effusion, right shoulder: Secondary | ICD-10-CM | POA: Diagnosis not present

## 2023-02-13 DIAGNOSIS — J3081 Allergic rhinitis due to animal (cat) (dog) hair and dander: Secondary | ICD-10-CM | POA: Diagnosis not present

## 2023-02-13 DIAGNOSIS — Z4789 Encounter for other orthopedic aftercare: Secondary | ICD-10-CM | POA: Diagnosis not present

## 2023-02-13 DIAGNOSIS — M25511 Pain in right shoulder: Secondary | ICD-10-CM | POA: Diagnosis not present

## 2023-02-19 DIAGNOSIS — M25611 Stiffness of right shoulder, not elsewhere classified: Secondary | ICD-10-CM | POA: Diagnosis not present

## 2023-02-19 DIAGNOSIS — M25411 Effusion, right shoulder: Secondary | ICD-10-CM | POA: Diagnosis not present

## 2023-02-19 DIAGNOSIS — M25511 Pain in right shoulder: Secondary | ICD-10-CM | POA: Diagnosis not present

## 2023-02-19 DIAGNOSIS — Z4789 Encounter for other orthopedic aftercare: Secondary | ICD-10-CM | POA: Diagnosis not present

## 2023-02-21 DIAGNOSIS — M25511 Pain in right shoulder: Secondary | ICD-10-CM | POA: Diagnosis not present

## 2023-02-21 DIAGNOSIS — M25611 Stiffness of right shoulder, not elsewhere classified: Secondary | ICD-10-CM | POA: Diagnosis not present

## 2023-02-21 DIAGNOSIS — M25411 Effusion, right shoulder: Secondary | ICD-10-CM | POA: Diagnosis not present

## 2023-02-21 DIAGNOSIS — Z4789 Encounter for other orthopedic aftercare: Secondary | ICD-10-CM | POA: Diagnosis not present

## 2023-02-22 DIAGNOSIS — J3089 Other allergic rhinitis: Secondary | ICD-10-CM | POA: Diagnosis not present

## 2023-02-22 DIAGNOSIS — J3081 Allergic rhinitis due to animal (cat) (dog) hair and dander: Secondary | ICD-10-CM | POA: Diagnosis not present

## 2023-02-22 DIAGNOSIS — J301 Allergic rhinitis due to pollen: Secondary | ICD-10-CM | POA: Diagnosis not present

## 2023-02-28 DIAGNOSIS — M25511 Pain in right shoulder: Secondary | ICD-10-CM | POA: Diagnosis not present

## 2023-02-28 DIAGNOSIS — Z4789 Encounter for other orthopedic aftercare: Secondary | ICD-10-CM | POA: Diagnosis not present

## 2023-02-28 DIAGNOSIS — M25611 Stiffness of right shoulder, not elsewhere classified: Secondary | ICD-10-CM | POA: Diagnosis not present

## 2023-02-28 DIAGNOSIS — M25411 Effusion, right shoulder: Secondary | ICD-10-CM | POA: Diagnosis not present

## 2023-03-01 DIAGNOSIS — Z4789 Encounter for other orthopedic aftercare: Secondary | ICD-10-CM | POA: Diagnosis not present

## 2023-03-01 DIAGNOSIS — J3081 Allergic rhinitis due to animal (cat) (dog) hair and dander: Secondary | ICD-10-CM | POA: Diagnosis not present

## 2023-03-01 DIAGNOSIS — J3089 Other allergic rhinitis: Secondary | ICD-10-CM | POA: Diagnosis not present

## 2023-03-01 DIAGNOSIS — M25511 Pain in right shoulder: Secondary | ICD-10-CM | POA: Diagnosis not present

## 2023-03-01 DIAGNOSIS — J301 Allergic rhinitis due to pollen: Secondary | ICD-10-CM | POA: Diagnosis not present

## 2023-03-01 DIAGNOSIS — M25411 Effusion, right shoulder: Secondary | ICD-10-CM | POA: Diagnosis not present

## 2023-03-01 DIAGNOSIS — M25611 Stiffness of right shoulder, not elsewhere classified: Secondary | ICD-10-CM | POA: Diagnosis not present

## 2023-03-05 DIAGNOSIS — M25511 Pain in right shoulder: Secondary | ICD-10-CM | POA: Diagnosis not present

## 2023-03-05 DIAGNOSIS — M25411 Effusion, right shoulder: Secondary | ICD-10-CM | POA: Diagnosis not present

## 2023-03-05 DIAGNOSIS — M25611 Stiffness of right shoulder, not elsewhere classified: Secondary | ICD-10-CM | POA: Diagnosis not present

## 2023-03-05 DIAGNOSIS — Z4789 Encounter for other orthopedic aftercare: Secondary | ICD-10-CM | POA: Diagnosis not present

## 2023-03-07 DIAGNOSIS — Z4789 Encounter for other orthopedic aftercare: Secondary | ICD-10-CM | POA: Diagnosis not present

## 2023-03-07 DIAGNOSIS — M25511 Pain in right shoulder: Secondary | ICD-10-CM | POA: Diagnosis not present

## 2023-03-07 DIAGNOSIS — M25611 Stiffness of right shoulder, not elsewhere classified: Secondary | ICD-10-CM | POA: Diagnosis not present

## 2023-03-07 DIAGNOSIS — M25411 Effusion, right shoulder: Secondary | ICD-10-CM | POA: Diagnosis not present

## 2023-03-08 DIAGNOSIS — J3081 Allergic rhinitis due to animal (cat) (dog) hair and dander: Secondary | ICD-10-CM | POA: Diagnosis not present

## 2023-03-08 DIAGNOSIS — J3089 Other allergic rhinitis: Secondary | ICD-10-CM | POA: Diagnosis not present

## 2023-03-08 DIAGNOSIS — J301 Allergic rhinitis due to pollen: Secondary | ICD-10-CM | POA: Diagnosis not present

## 2023-03-11 DIAGNOSIS — M7521 Bicipital tendinitis, right shoulder: Secondary | ICD-10-CM | POA: Diagnosis not present

## 2023-03-11 DIAGNOSIS — M2550 Pain in unspecified joint: Secondary | ICD-10-CM | POA: Diagnosis not present

## 2023-03-12 DIAGNOSIS — M25611 Stiffness of right shoulder, not elsewhere classified: Secondary | ICD-10-CM | POA: Diagnosis not present

## 2023-03-12 DIAGNOSIS — Z4789 Encounter for other orthopedic aftercare: Secondary | ICD-10-CM | POA: Diagnosis not present

## 2023-03-12 DIAGNOSIS — M25511 Pain in right shoulder: Secondary | ICD-10-CM | POA: Diagnosis not present

## 2023-03-12 DIAGNOSIS — M25411 Effusion, right shoulder: Secondary | ICD-10-CM | POA: Diagnosis not present

## 2023-03-14 DIAGNOSIS — M25411 Effusion, right shoulder: Secondary | ICD-10-CM | POA: Diagnosis not present

## 2023-03-14 DIAGNOSIS — M25611 Stiffness of right shoulder, not elsewhere classified: Secondary | ICD-10-CM | POA: Diagnosis not present

## 2023-03-14 DIAGNOSIS — M25511 Pain in right shoulder: Secondary | ICD-10-CM | POA: Diagnosis not present

## 2023-03-14 DIAGNOSIS — Z4789 Encounter for other orthopedic aftercare: Secondary | ICD-10-CM | POA: Diagnosis not present

## 2023-03-15 DIAGNOSIS — J3081 Allergic rhinitis due to animal (cat) (dog) hair and dander: Secondary | ICD-10-CM | POA: Diagnosis not present

## 2023-03-15 DIAGNOSIS — J3089 Other allergic rhinitis: Secondary | ICD-10-CM | POA: Diagnosis not present

## 2023-03-15 DIAGNOSIS — J301 Allergic rhinitis due to pollen: Secondary | ICD-10-CM | POA: Diagnosis not present

## 2023-03-19 DIAGNOSIS — M25411 Effusion, right shoulder: Secondary | ICD-10-CM | POA: Diagnosis not present

## 2023-03-19 DIAGNOSIS — Z4789 Encounter for other orthopedic aftercare: Secondary | ICD-10-CM | POA: Diagnosis not present

## 2023-03-19 DIAGNOSIS — J301 Allergic rhinitis due to pollen: Secondary | ICD-10-CM | POA: Diagnosis not present

## 2023-03-19 DIAGNOSIS — M25611 Stiffness of right shoulder, not elsewhere classified: Secondary | ICD-10-CM | POA: Diagnosis not present

## 2023-03-19 DIAGNOSIS — M25511 Pain in right shoulder: Secondary | ICD-10-CM | POA: Diagnosis not present

## 2023-03-21 ENCOUNTER — Other Ambulatory Visit: Payer: Self-pay | Admitting: Neurology

## 2023-03-21 ENCOUNTER — Telehealth: Payer: Self-pay | Admitting: *Deleted

## 2023-03-21 DIAGNOSIS — M25511 Pain in right shoulder: Secondary | ICD-10-CM | POA: Diagnosis not present

## 2023-03-21 DIAGNOSIS — M25411 Effusion, right shoulder: Secondary | ICD-10-CM | POA: Diagnosis not present

## 2023-03-21 DIAGNOSIS — Z4789 Encounter for other orthopedic aftercare: Secondary | ICD-10-CM | POA: Diagnosis not present

## 2023-03-21 DIAGNOSIS — M25611 Stiffness of right shoulder, not elsewhere classified: Secondary | ICD-10-CM | POA: Diagnosis not present

## 2023-03-21 NOTE — Telephone Encounter (Signed)
 Message sent to PA team.

## 2023-03-22 ENCOUNTER — Other Ambulatory Visit (HOSPITAL_COMMUNITY): Payer: Self-pay

## 2023-03-22 ENCOUNTER — Telehealth: Payer: Self-pay

## 2023-03-22 DIAGNOSIS — J301 Allergic rhinitis due to pollen: Secondary | ICD-10-CM | POA: Diagnosis not present

## 2023-03-22 DIAGNOSIS — J3089 Other allergic rhinitis: Secondary | ICD-10-CM | POA: Diagnosis not present

## 2023-03-22 DIAGNOSIS — J3081 Allergic rhinitis due to animal (cat) (dog) hair and dander: Secondary | ICD-10-CM | POA: Diagnosis not present

## 2023-03-22 NOTE — Telephone Encounter (Signed)
 PA request has been Submitted. New Encounter created for follow up. For additional info see Pharmacy Prior Auth telephone encounter from 03/22/2023.

## 2023-03-22 NOTE — Telephone Encounter (Signed)
 Pharmacy Patient Advocate Encounter   Received notification from Physician's Office that prior authorization for Ubrelvy  100MG  tablets is required/requested.   Insurance verification completed.   The patient is insured through  [employer]  .   Per test claim: PA required; PA submitted to above mentioned insurance via CoverMyMeds Key/confirmation #/EOC [id number] Status is pending

## 2023-03-25 ENCOUNTER — Encounter: Payer: Self-pay | Admitting: Neurology

## 2023-03-26 DIAGNOSIS — Z4789 Encounter for other orthopedic aftercare: Secondary | ICD-10-CM | POA: Diagnosis not present

## 2023-03-26 DIAGNOSIS — M25611 Stiffness of right shoulder, not elsewhere classified: Secondary | ICD-10-CM | POA: Diagnosis not present

## 2023-03-26 DIAGNOSIS — M25411 Effusion, right shoulder: Secondary | ICD-10-CM | POA: Diagnosis not present

## 2023-03-26 DIAGNOSIS — M25511 Pain in right shoulder: Secondary | ICD-10-CM | POA: Diagnosis not present

## 2023-03-28 DIAGNOSIS — M25611 Stiffness of right shoulder, not elsewhere classified: Secondary | ICD-10-CM | POA: Diagnosis not present

## 2023-03-28 DIAGNOSIS — J3081 Allergic rhinitis due to animal (cat) (dog) hair and dander: Secondary | ICD-10-CM | POA: Diagnosis not present

## 2023-03-28 DIAGNOSIS — Z4789 Encounter for other orthopedic aftercare: Secondary | ICD-10-CM | POA: Diagnosis not present

## 2023-03-28 DIAGNOSIS — M25411 Effusion, right shoulder: Secondary | ICD-10-CM | POA: Diagnosis not present

## 2023-03-28 DIAGNOSIS — J301 Allergic rhinitis due to pollen: Secondary | ICD-10-CM | POA: Diagnosis not present

## 2023-03-28 DIAGNOSIS — M25511 Pain in right shoulder: Secondary | ICD-10-CM | POA: Diagnosis not present

## 2023-03-28 DIAGNOSIS — J3089 Other allergic rhinitis: Secondary | ICD-10-CM | POA: Diagnosis not present

## 2023-03-29 ENCOUNTER — Other Ambulatory Visit (HOSPITAL_COMMUNITY): Payer: Self-pay

## 2023-04-02 DIAGNOSIS — M25411 Effusion, right shoulder: Secondary | ICD-10-CM | POA: Diagnosis not present

## 2023-04-02 DIAGNOSIS — Z4789 Encounter for other orthopedic aftercare: Secondary | ICD-10-CM | POA: Diagnosis not present

## 2023-04-02 DIAGNOSIS — M25511 Pain in right shoulder: Secondary | ICD-10-CM | POA: Diagnosis not present

## 2023-04-02 DIAGNOSIS — M25611 Stiffness of right shoulder, not elsewhere classified: Secondary | ICD-10-CM | POA: Diagnosis not present

## 2023-04-03 NOTE — Telephone Encounter (Signed)
 Received a form via fax requesting more information-I faxed completed form along with chart notes to [phone] [facility].

## 2023-04-05 DIAGNOSIS — J301 Allergic rhinitis due to pollen: Secondary | ICD-10-CM | POA: Diagnosis not present

## 2023-04-05 DIAGNOSIS — J3089 Other allergic rhinitis: Secondary | ICD-10-CM | POA: Diagnosis not present

## 2023-04-05 DIAGNOSIS — M25611 Stiffness of right shoulder, not elsewhere classified: Secondary | ICD-10-CM | POA: Diagnosis not present

## 2023-04-05 DIAGNOSIS — M25411 Effusion, right shoulder: Secondary | ICD-10-CM | POA: Diagnosis not present

## 2023-04-05 DIAGNOSIS — J3081 Allergic rhinitis due to animal (cat) (dog) hair and dander: Secondary | ICD-10-CM | POA: Diagnosis not present

## 2023-04-05 DIAGNOSIS — M25511 Pain in right shoulder: Secondary | ICD-10-CM | POA: Diagnosis not present

## 2023-04-05 DIAGNOSIS — Z4789 Encounter for other orthopedic aftercare: Secondary | ICD-10-CM | POA: Diagnosis not present

## 2023-04-08 ENCOUNTER — Other Ambulatory Visit (HOSPITAL_COMMUNITY): Payer: Self-pay

## 2023-04-08 DIAGNOSIS — M25411 Effusion, right shoulder: Secondary | ICD-10-CM | POA: Diagnosis not present

## 2023-04-08 DIAGNOSIS — Z4789 Encounter for other orthopedic aftercare: Secondary | ICD-10-CM | POA: Diagnosis not present

## 2023-04-08 DIAGNOSIS — M25611 Stiffness of right shoulder, not elsewhere classified: Secondary | ICD-10-CM | POA: Diagnosis not present

## 2023-04-08 DIAGNOSIS — M25511 Pain in right shoulder: Secondary | ICD-10-CM | POA: Diagnosis not present

## 2023-04-08 NOTE — Telephone Encounter (Signed)
I called insurance and they received the additional information on the 15th and they have  3-5 business day turn around time-should have a determination by the end of this week.

## 2023-04-10 ENCOUNTER — Other Ambulatory Visit (HOSPITAL_COMMUNITY): Payer: Self-pay

## 2023-04-10 NOTE — Telephone Encounter (Signed)
Pharmacy Patient Advocate Encounter  Received notification from  [employer]  that Prior Authorization for Ubrelvy 100MG  tablets has been APPROVED from 04/09/23 to 08/07/23. Ran test claim, Copay is $0. This test claim was processed through [facility]- copay amounts may vary at other pharmacies due to pharmacy/plan contracts, or as the patient moves through the different stages of their insurance plan.   PA #/Case ID/Reference #: PA Case ID #: [id number]

## 2023-04-11 ENCOUNTER — Ambulatory Visit: Payer: BC Managed Care – PPO | Admitting: Podiatry

## 2023-04-11 ENCOUNTER — Encounter: Payer: Self-pay | Admitting: Podiatry

## 2023-04-11 ENCOUNTER — Ambulatory Visit (INDEPENDENT_AMBULATORY_CARE_PROVIDER_SITE_OTHER): Payer: BC Managed Care – PPO

## 2023-04-11 VITALS — Ht 62.0 in | Wt 136.0 lb

## 2023-04-11 DIAGNOSIS — M7671 Peroneal tendinitis, right leg: Secondary | ICD-10-CM

## 2023-04-11 DIAGNOSIS — M778 Other enthesopathies, not elsewhere classified: Secondary | ICD-10-CM | POA: Diagnosis not present

## 2023-04-11 MED ORDER — TRIAMCINOLONE ACETONIDE 10 MG/ML IJ SUSP
10.0000 mg | Freq: Once | INTRAMUSCULAR | Status: AC
Start: 2023-04-11 — End: 2023-04-11
  Administered 2023-04-11: 10 mg via INTRA_ARTICULAR

## 2023-04-11 NOTE — Progress Notes (Signed)
Subjective:   Patient ID: Pamela Norton, female   DOB: 33 y.o.   MRN: [id number]   HPI Patient presents stating that she has developed a lot of pain on the outside of the right foot and it has been recent and does also have a bunion on this right foot history correction of the left 1   ROS      Objective:  Physical Exam  Neurovascular status intact inflammation pain base of fifth metatarsal right fluid buildup around the area with a moderate structural bunion deformity right with redness and well-healed surgical site left first metatarsal     Assessment:  Peroneal tendinitis right inflammation fluid around the outside with pain and structural bunion right     Plan:  H&P x-ray reviewed discussed injection and risk and I did today do sterile prep injected the insertional point 3 mg dexamethasone Kenalog 5 mg Xylocaine advised on reduced activity ice therapy and will be seen back as symptoms indicate and she can wear the boot that she has at home from her previous surgery  X-rays indicate no signs of bone pathology in this area moderate bunion deformity right

## 2023-04-12 DIAGNOSIS — Z4789 Encounter for other orthopedic aftercare: Secondary | ICD-10-CM | POA: Diagnosis not present

## 2023-04-12 DIAGNOSIS — J3081 Allergic rhinitis due to animal (cat) (dog) hair and dander: Secondary | ICD-10-CM | POA: Diagnosis not present

## 2023-04-12 DIAGNOSIS — M25411 Effusion, right shoulder: Secondary | ICD-10-CM | POA: Diagnosis not present

## 2023-04-12 DIAGNOSIS — M25511 Pain in right shoulder: Secondary | ICD-10-CM | POA: Diagnosis not present

## 2023-04-12 DIAGNOSIS — J3089 Other allergic rhinitis: Secondary | ICD-10-CM | POA: Diagnosis not present

## 2023-04-12 DIAGNOSIS — J301 Allergic rhinitis due to pollen: Secondary | ICD-10-CM | POA: Diagnosis not present

## 2023-04-12 DIAGNOSIS — M25611 Stiffness of right shoulder, not elsewhere classified: Secondary | ICD-10-CM | POA: Diagnosis not present

## 2023-04-16 DIAGNOSIS — Z4789 Encounter for other orthopedic aftercare: Secondary | ICD-10-CM | POA: Diagnosis not present

## 2023-04-16 DIAGNOSIS — M25611 Stiffness of right shoulder, not elsewhere classified: Secondary | ICD-10-CM | POA: Diagnosis not present

## 2023-04-16 DIAGNOSIS — M25511 Pain in right shoulder: Secondary | ICD-10-CM | POA: Diagnosis not present

## 2023-04-16 DIAGNOSIS — M25411 Effusion, right shoulder: Secondary | ICD-10-CM | POA: Diagnosis not present

## 2023-04-18 DIAGNOSIS — M25611 Stiffness of right shoulder, not elsewhere classified: Secondary | ICD-10-CM | POA: Diagnosis not present

## 2023-04-18 DIAGNOSIS — M25411 Effusion, right shoulder: Secondary | ICD-10-CM | POA: Diagnosis not present

## 2023-04-18 DIAGNOSIS — Z4789 Encounter for other orthopedic aftercare: Secondary | ICD-10-CM | POA: Diagnosis not present

## 2023-04-18 DIAGNOSIS — M25511 Pain in right shoulder: Secondary | ICD-10-CM | POA: Diagnosis not present

## 2023-04-19 DIAGNOSIS — J3081 Allergic rhinitis due to animal (cat) (dog) hair and dander: Secondary | ICD-10-CM | POA: Diagnosis not present

## 2023-04-19 DIAGNOSIS — J3089 Other allergic rhinitis: Secondary | ICD-10-CM | POA: Diagnosis not present

## 2023-04-19 DIAGNOSIS — J301 Allergic rhinitis due to pollen: Secondary | ICD-10-CM | POA: Diagnosis not present

## 2023-04-23 DIAGNOSIS — M25411 Effusion, right shoulder: Secondary | ICD-10-CM | POA: Diagnosis not present

## 2023-04-23 DIAGNOSIS — M25511 Pain in right shoulder: Secondary | ICD-10-CM | POA: Diagnosis not present

## 2023-04-23 DIAGNOSIS — Z4789 Encounter for other orthopedic aftercare: Secondary | ICD-10-CM | POA: Diagnosis not present

## 2023-04-23 DIAGNOSIS — M25611 Stiffness of right shoulder, not elsewhere classified: Secondary | ICD-10-CM | POA: Diagnosis not present

## 2023-04-25 DIAGNOSIS — J4540 Moderate persistent asthma, uncomplicated: Secondary | ICD-10-CM | POA: Diagnosis not present

## 2023-04-25 DIAGNOSIS — Z4789 Encounter for other orthopedic aftercare: Secondary | ICD-10-CM | POA: Diagnosis not present

## 2023-04-25 DIAGNOSIS — J301 Allergic rhinitis due to pollen: Secondary | ICD-10-CM | POA: Diagnosis not present

## 2023-04-25 DIAGNOSIS — M25411 Effusion, right shoulder: Secondary | ICD-10-CM | POA: Diagnosis not present

## 2023-04-25 DIAGNOSIS — J3089 Other allergic rhinitis: Secondary | ICD-10-CM | POA: Diagnosis not present

## 2023-04-25 DIAGNOSIS — J3081 Allergic rhinitis due to animal (cat) (dog) hair and dander: Secondary | ICD-10-CM | POA: Diagnosis not present

## 2023-04-25 DIAGNOSIS — M25511 Pain in right shoulder: Secondary | ICD-10-CM | POA: Diagnosis not present

## 2023-04-25 DIAGNOSIS — M25611 Stiffness of right shoulder, not elsewhere classified: Secondary | ICD-10-CM | POA: Diagnosis not present

## 2023-04-30 DIAGNOSIS — M25511 Pain in right shoulder: Secondary | ICD-10-CM | POA: Diagnosis not present

## 2023-04-30 DIAGNOSIS — Z4789 Encounter for other orthopedic aftercare: Secondary | ICD-10-CM | POA: Diagnosis not present

## 2023-04-30 DIAGNOSIS — M25411 Effusion, right shoulder: Secondary | ICD-10-CM | POA: Diagnosis not present

## 2023-04-30 DIAGNOSIS — M25611 Stiffness of right shoulder, not elsewhere classified: Secondary | ICD-10-CM | POA: Diagnosis not present

## 2023-05-02 DIAGNOSIS — J3089 Other allergic rhinitis: Secondary | ICD-10-CM | POA: Diagnosis not present

## 2023-05-02 DIAGNOSIS — J301 Allergic rhinitis due to pollen: Secondary | ICD-10-CM | POA: Diagnosis not present

## 2023-05-02 DIAGNOSIS — J3081 Allergic rhinitis due to animal (cat) (dog) hair and dander: Secondary | ICD-10-CM | POA: Diagnosis not present

## 2023-05-03 DIAGNOSIS — M25611 Stiffness of right shoulder, not elsewhere classified: Secondary | ICD-10-CM | POA: Diagnosis not present

## 2023-05-03 DIAGNOSIS — Z4789 Encounter for other orthopedic aftercare: Secondary | ICD-10-CM | POA: Diagnosis not present

## 2023-05-03 DIAGNOSIS — M25411 Effusion, right shoulder: Secondary | ICD-10-CM | POA: Diagnosis not present

## 2023-05-03 DIAGNOSIS — M25511 Pain in right shoulder: Secondary | ICD-10-CM | POA: Diagnosis not present

## 2023-05-10 DIAGNOSIS — J301 Allergic rhinitis due to pollen: Secondary | ICD-10-CM | POA: Diagnosis not present

## 2023-05-10 DIAGNOSIS — M25411 Effusion, right shoulder: Secondary | ICD-10-CM | POA: Diagnosis not present

## 2023-05-10 DIAGNOSIS — J3081 Allergic rhinitis due to animal (cat) (dog) hair and dander: Secondary | ICD-10-CM | POA: Diagnosis not present

## 2023-05-10 DIAGNOSIS — M25611 Stiffness of right shoulder, not elsewhere classified: Secondary | ICD-10-CM | POA: Diagnosis not present

## 2023-05-10 DIAGNOSIS — M25511 Pain in right shoulder: Secondary | ICD-10-CM | POA: Diagnosis not present

## 2023-05-10 DIAGNOSIS — Z4789 Encounter for other orthopedic aftercare: Secondary | ICD-10-CM | POA: Diagnosis not present

## 2023-05-10 DIAGNOSIS — J3089 Other allergic rhinitis: Secondary | ICD-10-CM | POA: Diagnosis not present

## 2023-05-14 DIAGNOSIS — M25411 Effusion, right shoulder: Secondary | ICD-10-CM | POA: Diagnosis not present

## 2023-05-14 DIAGNOSIS — Z4789 Encounter for other orthopedic aftercare: Secondary | ICD-10-CM | POA: Diagnosis not present

## 2023-05-14 DIAGNOSIS — M25611 Stiffness of right shoulder, not elsewhere classified: Secondary | ICD-10-CM | POA: Diagnosis not present

## 2023-05-14 DIAGNOSIS — M25511 Pain in right shoulder: Secondary | ICD-10-CM | POA: Diagnosis not present

## 2023-05-17 DIAGNOSIS — J3081 Allergic rhinitis due to animal (cat) (dog) hair and dander: Secondary | ICD-10-CM | POA: Diagnosis not present

## 2023-05-17 DIAGNOSIS — M25411 Effusion, right shoulder: Secondary | ICD-10-CM | POA: Diagnosis not present

## 2023-05-17 DIAGNOSIS — M25611 Stiffness of right shoulder, not elsewhere classified: Secondary | ICD-10-CM | POA: Diagnosis not present

## 2023-05-17 DIAGNOSIS — M25511 Pain in right shoulder: Secondary | ICD-10-CM | POA: Diagnosis not present

## 2023-05-17 DIAGNOSIS — J3089 Other allergic rhinitis: Secondary | ICD-10-CM | POA: Diagnosis not present

## 2023-05-17 DIAGNOSIS — Z4789 Encounter for other orthopedic aftercare: Secondary | ICD-10-CM | POA: Diagnosis not present

## 2023-05-17 DIAGNOSIS — J301 Allergic rhinitis due to pollen: Secondary | ICD-10-CM | POA: Diagnosis not present

## 2023-05-18 ENCOUNTER — Encounter (HOSPITAL_BASED_OUTPATIENT_CLINIC_OR_DEPARTMENT_OTHER): Payer: Self-pay | Admitting: Orthopaedic Surgery

## 2023-05-18 ENCOUNTER — Other Ambulatory Visit: Payer: Self-pay | Admitting: Neurology

## 2023-05-21 DIAGNOSIS — M25511 Pain in right shoulder: Secondary | ICD-10-CM | POA: Diagnosis not present

## 2023-05-21 DIAGNOSIS — Z4789 Encounter for other orthopedic aftercare: Secondary | ICD-10-CM | POA: Diagnosis not present

## 2023-05-21 DIAGNOSIS — M25411 Effusion, right shoulder: Secondary | ICD-10-CM | POA: Diagnosis not present

## 2023-05-21 DIAGNOSIS — M25611 Stiffness of right shoulder, not elsewhere classified: Secondary | ICD-10-CM | POA: Diagnosis not present

## 2023-05-24 DIAGNOSIS — Z4789 Encounter for other orthopedic aftercare: Secondary | ICD-10-CM | POA: Diagnosis not present

## 2023-05-24 DIAGNOSIS — M25611 Stiffness of right shoulder, not elsewhere classified: Secondary | ICD-10-CM | POA: Diagnosis not present

## 2023-05-24 DIAGNOSIS — J3081 Allergic rhinitis due to animal (cat) (dog) hair and dander: Secondary | ICD-10-CM | POA: Diagnosis not present

## 2023-05-24 DIAGNOSIS — M25411 Effusion, right shoulder: Secondary | ICD-10-CM | POA: Diagnosis not present

## 2023-05-24 DIAGNOSIS — M25511 Pain in right shoulder: Secondary | ICD-10-CM | POA: Diagnosis not present

## 2023-05-24 DIAGNOSIS — J301 Allergic rhinitis due to pollen: Secondary | ICD-10-CM | POA: Diagnosis not present

## 2023-05-24 DIAGNOSIS — J3089 Other allergic rhinitis: Secondary | ICD-10-CM | POA: Diagnosis not present

## 2023-05-27 ENCOUNTER — Ambulatory Visit (HOSPITAL_BASED_OUTPATIENT_CLINIC_OR_DEPARTMENT_OTHER): Payer: BC Managed Care – PPO | Admitting: Orthopaedic Surgery

## 2023-05-27 DIAGNOSIS — M25311 Other instability, right shoulder: Secondary | ICD-10-CM | POA: Diagnosis not present

## 2023-05-27 NOTE — Progress Notes (Signed)
 Chief Complaint: Right shoulder pain     History of Present Illness:    Pamela Norton is a 33 y.o. female presents today with right shoulder pain.  Status post right shoulder subpectoral biceps tenodesis with Dr. Freida Busman in June 2024.  She is presenting today as she is having persistent shoulder pain and subluxations with overhead activity.  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  This is her dominant arm.  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  She has been extremely rigorous with her postoperative physical therapy but is having persistent anterior glenohumeral type pain.  Dates that she has had several subluxation episodes since this time.    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    Social History   Socioeconomic History   Marital status: Single    Spouse name: Not on file   Number of children: Not on file   Years of education: Not on file   Highest education level: Not on file  Occupational History   Not on file  Tobacco Use   Smoking status: Never   Smokeless tobacco: Never  Vaping Use   Vaping status: Never Used  Substance and Sexual Activity   Alcohol use: Yes    Alcohol/week: 1.0 standard drink of alcohol    Types: 1 Cans of beer per week   Drug use: No   Sexual activity: Never    Birth control/protection: Pill  Other Topics Concern   Not on file  Social History Narrative   Not on file   Social Drivers of Health   Financial Resource  Strain: Not on file  Food Insecurity: Not on file  Transportation Needs: Not on file  Physical Activity: Not on file  Stress: Not on file  Social Connections: Not on file   Family History  Problem Relation Age of Onset   Thyroid disease Mother    Hypothyroidism Mother    Osteoarthritis Mother    Fibromyalgia Mother    Restless legs syndrome Mother    Thyroid cancer Mother 21       Papillary   Melanoma Mother 93   Colon polyps Mother        cscope every 3 years   Migraines Mother    Colon polyps Father    Colon cancer Maternal Grandmother 33   Kidney cancer Maternal Grandfather 16   Dementia Paternal Grandmother    Throat cancer Paternal Grandfather    Melanoma Paternal Grandfather    Breast cancer Other    Allergies  Allergen Reactions   Clarithromycin Anxiety, Nausea Only and Shortness Of Breath   Amphetamine-Dextroamphetamine     Other reaction(s): nausea   Cefaclor     REACTION: Hives Other reaction(s): hives   Food     Multiple Food Allergies: bell Peppers, Garlic, Shellfish, Pine Nuts, Peanuts, Soy, Melon, Watermelon, Honeydew, Cantaloupe, Squash, Pumpkin,  Gourd, Zucchini   Meloxicam     Other reaction(s): migraine and hives   Peanuts [Peanut Oil]    Prednisone     Other reaction(s): tachycardia   Soy Allergy (Obsolete)    Coconut (Cocos Nucifera) Dermatitis, Hives and Rash   Latex Hives and Rash   Current Outpatient Medications  Medication Sig Dispense Refill   albuterol (ACCUNEB) 0.63 MG/3ML nebulizer solution Take 1 ampule by nebulization every 6 (six) hours as needed for Wheezing.     azelastine (ASTELIN) 0.1 % nasal spray Place into both nostrils.     Baclofen 20 MG/20ML SOLN 2 (two) times daily as needed.     budesonide-formoterol (SYMBICORT) 160-4.5 MCG/ACT inhaler USE 2 PUFFS TWICE A DAY     celecoxib (CELEBREX) 100 MG capsule Take 1 capsule (100 mg total) by mouth 2 (two) times daily. 60 capsule 2   EPINEPHrine 0.3 mg/0.3 mL IJ SOAJ injection See  admin instructions.  0   fexofenadine (ALLEGRA) 180 MG tablet Take 180 mg by mouth in the morning and at bedtime.     FLUoxetine (PROZAC) 20 MG capsule Take 20 mg by mouth every morning.  2   Galcanezumab-gnlm (EMGALITY) 120 MG/ML SOAJ INJECT 1 PEN INTO THE SKIN EVERY 30 DAYS 1 mL 6   montelukast (SINGULAIR) 10 MG tablet Take 10 mg by mouth at bedtime.     Multiple Vitamin (MULTIVITAMIN) capsule Take 1 capsule by mouth daily.     omeprazole (PRILOSEC) 20 MG capsule Take 20 mg by mouth daily.     SPIRIVA RESPIMAT 1.25 MCG/ACT AERS SMARTSIG:2 Puff(s) Via Inhaler Daily     triamcinolone (NASACORT) 55 MCG/ACT AERO nasal inhaler Place 2 sprays into the nose daily.     UBRELVY 100 MG TABS Take 100 mg by mouth if needed as close to onset of migraine as possible. May repeat in 2 hours if headache persists or returns. Do not exceed 2 tablets in 24 hours. 10 tablet 11   No current facility-administered medications for this visit.   No results found.  Review of Systems:   A ROS was performed including pertinent positives and negatives as documented in the HPI.  Physical Exam :   Constitutional: NAD and appears stated age Neurological: Alert and oriented Psych: Appropriate affect and cooperative There were no vitals taken for this visit.   Comprehensive Musculoskeletal Exam:    Musculoskeletal Exam    Inspection Right Left  Skin No atrophy or winging No atrophy or winging  Palpation    Tenderness Glenohumeral None  Range of Motion    Flexion (passive) 170 170  Flexion (active) 170 170  Abduction 170 170  ER at the side 70 70  Can reach behind back to T12 T12  Strength     Full None  Special Tests    Pseudoparalytic No No  Neurologic    Fires PIN, radial, median, ulnar, musculocutaneous, axillary, suprascapular, long thoracic, and spinal accessory innervated muscles. No abnormal sensibility  Vascular/Lymphatic    Radial Pulse 2+ 2+  Cervical Exam    Patient has symmetric cervical  range of motion with negative Spurling's test.  Special Test: 2+ anterior posterior load-and-shift      Imaging:   Xray (3 views right shoulder): Normal    I personally reviewed and interpreted the radiographs.   Assessment and Plan:   33 y.o. female right-hand-dominant female with right shoulder pain in the setting of ligamentous laxity.  I did discuss that with her history of hypermobility  she likely does suffer from some component of multidirectional instability although this really not intentional.  She has subluxation episodes despite strengthening of the shoulder.  Overall I do believe that this is likely result of laxity in the glenohumeral joint.  I did describe that I would like to obtain an MRI to rule out any type of underlying chondral loss and that ultimately she may be a candidate for a labral imbrication as she has been quite rigorous now with at least 6 months of physical therapy.  -Return to clinic following MRI right shoulder   I personally saw and evaluated the patient, and participated in the management and treatment plan.  Huel Cote, MD Attending Physician, Orthopedic Surgery  This document was dictated using Dragon voice recognition software. A reasonable attempt at proof reading has been made to minimize errors.

## 2023-05-28 DIAGNOSIS — Z4789 Encounter for other orthopedic aftercare: Secondary | ICD-10-CM | POA: Diagnosis not present

## 2023-05-28 DIAGNOSIS — M25611 Stiffness of right shoulder, not elsewhere classified: Secondary | ICD-10-CM | POA: Diagnosis not present

## 2023-05-28 DIAGNOSIS — M25411 Effusion, right shoulder: Secondary | ICD-10-CM | POA: Diagnosis not present

## 2023-05-28 DIAGNOSIS — M25511 Pain in right shoulder: Secondary | ICD-10-CM | POA: Diagnosis not present

## 2023-05-30 DIAGNOSIS — Z4789 Encounter for other orthopedic aftercare: Secondary | ICD-10-CM | POA: Diagnosis not present

## 2023-05-30 DIAGNOSIS — M25411 Effusion, right shoulder: Secondary | ICD-10-CM | POA: Diagnosis not present

## 2023-05-30 DIAGNOSIS — M25611 Stiffness of right shoulder, not elsewhere classified: Secondary | ICD-10-CM | POA: Diagnosis not present

## 2023-05-30 DIAGNOSIS — M25511 Pain in right shoulder: Secondary | ICD-10-CM | POA: Diagnosis not present

## 2023-05-31 ENCOUNTER — Encounter (HOSPITAL_BASED_OUTPATIENT_CLINIC_OR_DEPARTMENT_OTHER): Payer: Self-pay | Admitting: Orthopaedic Surgery

## 2023-05-31 DIAGNOSIS — J3089 Other allergic rhinitis: Secondary | ICD-10-CM | POA: Diagnosis not present

## 2023-05-31 DIAGNOSIS — J3081 Allergic rhinitis due to animal (cat) (dog) hair and dander: Secondary | ICD-10-CM | POA: Diagnosis not present

## 2023-05-31 DIAGNOSIS — J301 Allergic rhinitis due to pollen: Secondary | ICD-10-CM | POA: Diagnosis not present

## 2023-06-03 DIAGNOSIS — D485 Neoplasm of uncertain behavior of skin: Secondary | ICD-10-CM | POA: Diagnosis not present

## 2023-06-03 DIAGNOSIS — Z1283 Encounter for screening for malignant neoplasm of skin: Secondary | ICD-10-CM | POA: Diagnosis not present

## 2023-06-03 DIAGNOSIS — D225 Melanocytic nevi of trunk: Secondary | ICD-10-CM | POA: Diagnosis not present

## 2023-06-04 DIAGNOSIS — M25411 Effusion, right shoulder: Secondary | ICD-10-CM | POA: Diagnosis not present

## 2023-06-04 DIAGNOSIS — Z4789 Encounter for other orthopedic aftercare: Secondary | ICD-10-CM | POA: Diagnosis not present

## 2023-06-04 DIAGNOSIS — M25611 Stiffness of right shoulder, not elsewhere classified: Secondary | ICD-10-CM | POA: Diagnosis not present

## 2023-06-04 DIAGNOSIS — M25511 Pain in right shoulder: Secondary | ICD-10-CM | POA: Diagnosis not present

## 2023-06-05 ENCOUNTER — Ambulatory Visit
Admission: RE | Admit: 2023-06-05 | Discharge: 2023-06-05 | Disposition: A | Discharge status: Home / Self Care | Source: Ambulatory Visit | Attending: Orthopaedic Surgery | Admitting: Orthopaedic Surgery

## 2023-06-05 DIAGNOSIS — M25511 Pain in right shoulder: Secondary | ICD-10-CM | POA: Diagnosis not present

## 2023-06-05 DIAGNOSIS — M25311 Other instability, right shoulder: Secondary | ICD-10-CM

## 2023-06-06 DIAGNOSIS — M25511 Pain in right shoulder: Secondary | ICD-10-CM | POA: Diagnosis not present

## 2023-06-06 DIAGNOSIS — Z4789 Encounter for other orthopedic aftercare: Secondary | ICD-10-CM | POA: Diagnosis not present

## 2023-06-06 DIAGNOSIS — J3089 Other allergic rhinitis: Secondary | ICD-10-CM | POA: Diagnosis not present

## 2023-06-06 DIAGNOSIS — M25611 Stiffness of right shoulder, not elsewhere classified: Secondary | ICD-10-CM | POA: Diagnosis not present

## 2023-06-06 DIAGNOSIS — J301 Allergic rhinitis due to pollen: Secondary | ICD-10-CM | POA: Diagnosis not present

## 2023-06-06 DIAGNOSIS — M25411 Effusion, right shoulder: Secondary | ICD-10-CM | POA: Diagnosis not present

## 2023-06-06 DIAGNOSIS — J3081 Allergic rhinitis due to animal (cat) (dog) hair and dander: Secondary | ICD-10-CM | POA: Diagnosis not present

## 2023-06-11 DIAGNOSIS — M25411 Effusion, right shoulder: Secondary | ICD-10-CM | POA: Diagnosis not present

## 2023-06-11 DIAGNOSIS — M25511 Pain in right shoulder: Secondary | ICD-10-CM | POA: Diagnosis not present

## 2023-06-11 DIAGNOSIS — Z4789 Encounter for other orthopedic aftercare: Secondary | ICD-10-CM | POA: Diagnosis not present

## 2023-06-11 DIAGNOSIS — M25611 Stiffness of right shoulder, not elsewhere classified: Secondary | ICD-10-CM | POA: Diagnosis not present

## 2023-06-12 ENCOUNTER — Other Ambulatory Visit (HOSPITAL_BASED_OUTPATIENT_CLINIC_OR_DEPARTMENT_OTHER): Payer: Self-pay

## 2023-06-12 ENCOUNTER — Other Ambulatory Visit (HOSPITAL_BASED_OUTPATIENT_CLINIC_OR_DEPARTMENT_OTHER): Payer: Self-pay | Admitting: Orthopaedic Surgery

## 2023-06-12 ENCOUNTER — Ambulatory Visit (HOSPITAL_BASED_OUTPATIENT_CLINIC_OR_DEPARTMENT_OTHER): Admitting: Orthopaedic Surgery

## 2023-06-12 ENCOUNTER — Encounter (HOSPITAL_BASED_OUTPATIENT_CLINIC_OR_DEPARTMENT_OTHER): Payer: Self-pay | Admitting: Orthopaedic Surgery

## 2023-06-12 ENCOUNTER — Ambulatory Visit (HOSPITAL_BASED_OUTPATIENT_CLINIC_OR_DEPARTMENT_OTHER): Payer: Self-pay | Admitting: Orthopaedic Surgery

## 2023-06-12 DIAGNOSIS — M25311 Other instability, right shoulder: Secondary | ICD-10-CM | POA: Diagnosis not present

## 2023-06-12 MED ORDER — IBUPROFEN 800 MG PO TABS
800.0000 mg | ORAL_TABLET | Freq: Three times a day (TID) | ORAL | 0 refills | Status: AC
Start: 2023-06-12 — End: 2023-06-22

## 2023-06-12 MED ORDER — ASPIRIN 325 MG PO TBEC: 325.0000 mg | DELAYED_RELEASE_TABLET | Freq: Every day | ORAL | 0 refills | Status: AC

## 2023-06-12 MED ORDER — SCOPOLAMINE 1 MG/3DAYS TD PT72: 1.0000 | MEDICATED_PATCH | TRANSDERMAL | 0 refills | Status: AC

## 2023-06-12 MED ORDER — HYDROMORPHONE HCL 2 MG PO TABS: 2.0000 mg | ORAL_TABLET | ORAL | 0 refills | Status: AC | PRN

## 2023-06-12 MED ORDER — ACETAMINOPHEN 500 MG PO TABS
500.0000 mg | ORAL_TABLET | Freq: Three times a day (TID) | ORAL | 0 refills | Status: AC
Start: 2023-06-12 — End: 2023-06-22

## 2023-06-12 NOTE — Progress Notes (Signed)
 Chief Complaint: Right shoulder pain     History of Present Illness:   06/12/2023: Presents today for follow-up of her right shoulder as well as MRI discussion.  She is still symptomatic and having recurrent subluxation episodes  Pamela Norton is a 33 y.o. female presents today with right shoulder pain.  Status post right shoulder subpectoral biceps tenodesis with Dr. Freida Busman in June 2024.  She is presenting today as she is having persistent shoulder pain and subluxations with overhead activity.  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  This is her dominant arm.  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  She has been extremely rigorous with her postoperative physical therapy but is having persistent anterior glenohumeral type pain.  Dates that she has had several subluxation episodes since this time.    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    Social History   Socioeconomic History   Marital status: Single    Spouse name: Not on file   Number of children: Not on file   Years of education: Not on file   Highest education level: Not on file  Occupational History   Not on file  Tobacco Use   Smoking status: Never   Smokeless tobacco: Never  Vaping Use   Vaping status: Never Used  Substance and Sexual Activity   Alcohol use: Yes    Alcohol/week: 1.0 standard drink of alcohol    Types: 1 Cans of beer per week   Drug use: No   Sexual activity: Never    Birth  control/protection: Pill  Other Topics Concern   Not on file  Social History Narrative   Not on file   Social Drivers of Health   Financial Resource Strain: Not on file  Food Insecurity: Not on file  Transportation Needs: Not on file  Physical Activity: Not on file  Stress: Not on file  Social Connections: Not on file   Family History  Problem Relation Age of Onset   Thyroid disease Mother    Hypothyroidism Mother    Osteoarthritis Mother    Fibromyalgia Mother    Restless legs syndrome Mother    Thyroid cancer Mother 48       Papillary   Melanoma Mother 41   Colon polyps Mother        cscope every 3 years   Migraines Mother    Colon polyps Father    Colon cancer Maternal Grandmother 10   Kidney cancer Maternal Grandfather 74   Dementia Paternal Grandmother    Throat cancer Paternal Grandfather    Melanoma Paternal Grandfather    Breast cancer Other    Allergies  Allergen Reactions   Clarithromycin Anxiety, Nausea Only and Shortness Of Breath   Amphetamine-Dextroamphetamine     Other reaction(s): nausea   Cefaclor     REACTION: Hives Other reaction(s): hives  Food     Multiple Food Allergies: bell Peppers, Garlic, Shellfish, Pine Nuts, Peanuts, Soy, Melon, Watermelon, Honeydew, Cantaloupe, Squash, Pumpkin, Gourd, Zucchini   Meloxicam     Other reaction(s): migraine and hives   Peanuts [Peanut Oil]    Prednisone     Other reaction(s): tachycardia   Soy Allergy (Obsolete)    Coconut (Cocos Nucifera) Dermatitis, Hives and Rash   Latex Hives and Rash   Current Outpatient Medications  Medication Sig Dispense Refill   acetaminophen (TYLENOL) 500 MG tablet Take 1 tablet (500 mg total) by mouth every 8 (eight) hours for 10 days. 30 tablet 0   aspirin EC 325 MG tablet Take 1 tablet (325 mg total) by mouth daily. 14 tablet 0   HYDROmorphone (DILAUDID) 2 MG tablet Take 1 tablet (2 mg total) by mouth every 4 (four) hours as needed for severe pain (pain score 7-10). 30  tablet 0   ibuprofen (ADVIL) 800 MG tablet Take 1 tablet (800 mg total) by mouth every 8 (eight) hours for 10 days. Please take with food, please alternate with acetaminophen 30 tablet 0   scopolamine (TRANSDERM-SCOP) 1 MG/3DAYS Place 1 patch (1.5 mg total) onto the skin every 3 (three) days. 10 patch 0   albuterol (ACCUNEB) 0.63 MG/3ML nebulizer solution Take 1 ampule by nebulization every 6 (six) hours as needed for Wheezing.     azelastine (ASTELIN) 0.1 % nasal spray Place into both nostrils.     Baclofen 20 MG/20ML SOLN 2 (two) times daily as needed.     budesonide-formoterol (SYMBICORT) 160-4.5 MCG/ACT inhaler USE 2 PUFFS TWICE A DAY     celecoxib (CELEBREX) 100 MG capsule Take 1 capsule (100 mg total) by mouth 2 (two) times daily. 60 capsule 2   EPINEPHrine 0.3 mg/0.3 mL IJ SOAJ injection See admin instructions.  0   fexofenadine (ALLEGRA) 180 MG tablet Take 180 mg by mouth in the morning and at bedtime.     FLUoxetine (PROZAC) 20 MG capsule Take 20 mg by mouth every morning.  2   Galcanezumab-gnlm (EMGALITY) 120 MG/ML SOAJ INJECT 1 PEN INTO THE SKIN EVERY 30 DAYS 1 mL 6   montelukast (SINGULAIR) 10 MG tablet Take 10 mg by mouth at bedtime.     Multiple Vitamin (MULTIVITAMIN) capsule Take 1 capsule by mouth daily.     omeprazole (PRILOSEC) 20 MG capsule Take 20 mg by mouth daily.     SPIRIVA RESPIMAT 1.25 MCG/ACT AERS SMARTSIG:2 Puff(s) Via Inhaler Daily     triamcinolone (NASACORT) 55 MCG/ACT AERO nasal inhaler Place 2 sprays into the nose daily.     UBRELVY 100 MG TABS Take 100 mg by mouth if needed as close to onset of migraine as possible. May repeat in 2 hours if headache persists or returns. Do not exceed 2 tablets in 24 hours. 10 tablet 11   No current facility-administered medications for this visit.   No results found.  Review of Systems:   A ROS was performed including pertinent positives and negatives as documented in the HPI.  Physical Exam :   Constitutional: NAD and  appears stated age Neurological: Alert and oriented Psych: Appropriate affect and cooperative There were no vitals taken for this visit.   Comprehensive Musculoskeletal Exam:    Musculoskeletal Exam    Inspection Right Left  Skin No atrophy or winging No atrophy or winging  Palpation    Tenderness Glenohumeral None  Range of Motion    Flexion (passive) 170 170  Flexion (  active) 170 170  Abduction 170 170  ER at the side 70 70  Can reach behind back to T12 T12  Strength     Full None  Special Tests    Pseudoparalytic No No  Neurologic    Fires PIN, radial, median, ulnar, musculocutaneous, axillary, suprascapular, long thoracic, and spinal accessory innervated muscles. No abnormal sensibility  Vascular/Lymphatic    Radial Pulse 2+ 2+  Cervical Exam    Patient has symmetric cervical range of motion with negative Spurling's test.  Special Test: 2+ anterior posterior load-and-shift      Imaging:   Xray (3 views right shoulder): Normal  MRI right shoulder: Inferior and posterior labral tearing  I personally reviewed and interpreted the radiographs.   Assessment and Plan:   33 y.o. female right-hand-dominant female with right shoulder pain in the setting of ligamentous laxity.  I did discuss that with her history of hypermobility she likely does suffer from some component of multidirectional instability although this really not intentional.  She has subluxation episodes despite strengthening of the shoulder.  Overall I do believe that this is likely result of laxity in the glenohumeral joint.  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  This time given the fact that she is status post previous superior labral tear with recurrent pain and inability to perform overhead motion we did discuss the possibility of revision surgery.  I did ultimately discuss that I do believe she would fit from shoulder arthroscopy with inferior labral pair.  I discussed  the risks and limitations.  After discussion of this she is elected to proceed  -Plan for right shoulder arthroscopy with inferior labral repair   After a lengthy discussion of treatment options, including risks, benefits, alternatives, complications of surgical and nonsurgical conservative options, the patient elected surgical repair.   The patient  is aware of the material risks  and complications including, but not limited to injury to adjacent structures, neurovascular injury, infection, numbness, bleeding, implant failure, thermal burns, stiffness, persistent pain, failure to heal, disease transmission from allograft, need for further surgery, dislocation, anesthetic risks, blood clots, risks of death,and others. The probabilities of surgical success and failure discussed with patient given their particular co-morbidities.The time and nature of expected rehabilitation and recovery was discussed.The patient's questions were all answered preoperatively.  No barriers to understanding were noted. I explained the natural history of the disease process and Rx rationale.  I explained to the patient what I considered to be reasonable expectations given their personal situation.  The final treatment plan was arrived at through a shared patient decision making process model.    I personally saw and evaluated the patient, and participated in the management and treatment plan.  Huel Cote, MD Attending Physician, Orthopedic Surgery  This document was dictated using Dragon voice recognition software. A reasonable attempt at proof reading has been made to minimize errors.

## 2023-06-13 ENCOUNTER — Other Ambulatory Visit (HOSPITAL_BASED_OUTPATIENT_CLINIC_OR_DEPARTMENT_OTHER): Payer: Self-pay | Admitting: Orthopaedic Surgery

## 2023-06-13 ENCOUNTER — Encounter (HOSPITAL_BASED_OUTPATIENT_CLINIC_OR_DEPARTMENT_OTHER): Payer: Self-pay | Admitting: Orthopaedic Surgery

## 2023-06-13 MED ORDER — ONDANSETRON 8 MG PO TBDP: 8.0000 mg | ORAL_TABLET | Freq: Three times a day (TID) | ORAL | 0 refills | Status: AC | PRN

## 2023-06-14 DIAGNOSIS — M25511 Pain in right shoulder: Secondary | ICD-10-CM | POA: Diagnosis not present

## 2023-06-14 DIAGNOSIS — J3089 Other allergic rhinitis: Secondary | ICD-10-CM | POA: Diagnosis not present

## 2023-06-14 DIAGNOSIS — M25411 Effusion, right shoulder: Secondary | ICD-10-CM | POA: Diagnosis not present

## 2023-06-14 DIAGNOSIS — J301 Allergic rhinitis due to pollen: Secondary | ICD-10-CM | POA: Diagnosis not present

## 2023-06-14 DIAGNOSIS — J3081 Allergic rhinitis due to animal (cat) (dog) hair and dander: Secondary | ICD-10-CM | POA: Diagnosis not present

## 2023-06-14 DIAGNOSIS — M25611 Stiffness of right shoulder, not elsewhere classified: Secondary | ICD-10-CM | POA: Diagnosis not present

## 2023-06-14 DIAGNOSIS — Z4789 Encounter for other orthopedic aftercare: Secondary | ICD-10-CM | POA: Diagnosis not present

## 2023-06-18 ENCOUNTER — Other Ambulatory Visit (HOSPITAL_BASED_OUTPATIENT_CLINIC_OR_DEPARTMENT_OTHER): Payer: Self-pay

## 2023-06-18 ENCOUNTER — Other Ambulatory Visit: Payer: Self-pay | Admitting: Orthopaedic Surgery

## 2023-06-18 DIAGNOSIS — Z4789 Encounter for other orthopedic aftercare: Secondary | ICD-10-CM | POA: Diagnosis not present

## 2023-06-18 DIAGNOSIS — M25411 Effusion, right shoulder: Secondary | ICD-10-CM | POA: Diagnosis not present

## 2023-06-18 DIAGNOSIS — M25511 Pain in right shoulder: Secondary | ICD-10-CM | POA: Diagnosis not present

## 2023-06-18 DIAGNOSIS — M25611 Stiffness of right shoulder, not elsewhere classified: Secondary | ICD-10-CM | POA: Diagnosis not present

## 2023-06-18 MED ORDER — HYDROMORPHONE HCL 2 MG PO TABS
2.0000 mg | ORAL_TABLET | ORAL | 0 refills | Status: AC | PRN
  Filled 2023-06-18: qty 20, 4d supply, fill #0

## 2023-06-19 ENCOUNTER — Other Ambulatory Visit (HOSPITAL_BASED_OUTPATIENT_CLINIC_OR_DEPARTMENT_OTHER): Payer: Self-pay | Admitting: Orthopaedic Surgery

## 2023-06-19 ENCOUNTER — Ambulatory Visit (HOSPITAL_BASED_OUTPATIENT_CLINIC_OR_DEPARTMENT_OTHER): Admitting: Orthopaedic Surgery

## 2023-06-20 DIAGNOSIS — M25511 Pain in right shoulder: Secondary | ICD-10-CM | POA: Diagnosis not present

## 2023-06-20 DIAGNOSIS — M25411 Effusion, right shoulder: Secondary | ICD-10-CM | POA: Diagnosis not present

## 2023-06-20 DIAGNOSIS — M25611 Stiffness of right shoulder, not elsewhere classified: Secondary | ICD-10-CM | POA: Diagnosis not present

## 2023-06-20 DIAGNOSIS — Z4789 Encounter for other orthopedic aftercare: Secondary | ICD-10-CM | POA: Diagnosis not present

## 2023-06-21 ENCOUNTER — Other Ambulatory Visit (HOSPITAL_BASED_OUTPATIENT_CLINIC_OR_DEPARTMENT_OTHER): Payer: Self-pay | Admitting: Orthopaedic Surgery

## 2023-06-21 DIAGNOSIS — J301 Allergic rhinitis due to pollen: Secondary | ICD-10-CM | POA: Diagnosis not present

## 2023-06-21 DIAGNOSIS — J3089 Other allergic rhinitis: Secondary | ICD-10-CM | POA: Diagnosis not present

## 2023-06-21 DIAGNOSIS — M25311 Other instability, right shoulder: Secondary | ICD-10-CM

## 2023-06-21 DIAGNOSIS — J3081 Allergic rhinitis due to animal (cat) (dog) hair and dander: Secondary | ICD-10-CM | POA: Diagnosis not present

## 2023-06-25 DIAGNOSIS — M25611 Stiffness of right shoulder, not elsewhere classified: Secondary | ICD-10-CM | POA: Diagnosis not present

## 2023-06-25 DIAGNOSIS — J3081 Allergic rhinitis due to animal (cat) (dog) hair and dander: Secondary | ICD-10-CM | POA: Diagnosis not present

## 2023-06-25 DIAGNOSIS — M25511 Pain in right shoulder: Secondary | ICD-10-CM | POA: Diagnosis not present

## 2023-06-25 DIAGNOSIS — J019 Acute sinusitis, unspecified: Secondary | ICD-10-CM | POA: Diagnosis not present

## 2023-06-25 DIAGNOSIS — M25411 Effusion, right shoulder: Secondary | ICD-10-CM | POA: Diagnosis not present

## 2023-06-25 DIAGNOSIS — Z4789 Encounter for other orthopedic aftercare: Secondary | ICD-10-CM | POA: Diagnosis not present

## 2023-06-25 DIAGNOSIS — J301 Allergic rhinitis due to pollen: Secondary | ICD-10-CM | POA: Diagnosis not present

## 2023-06-25 DIAGNOSIS — J3089 Other allergic rhinitis: Secondary | ICD-10-CM | POA: Diagnosis not present

## 2023-06-28 DIAGNOSIS — M25611 Stiffness of right shoulder, not elsewhere classified: Secondary | ICD-10-CM | POA: Diagnosis not present

## 2023-06-28 DIAGNOSIS — M25511 Pain in right shoulder: Secondary | ICD-10-CM | POA: Diagnosis not present

## 2023-06-28 DIAGNOSIS — M25411 Effusion, right shoulder: Secondary | ICD-10-CM | POA: Diagnosis not present

## 2023-06-28 DIAGNOSIS — Z4789 Encounter for other orthopedic aftercare: Secondary | ICD-10-CM | POA: Diagnosis not present

## 2023-07-02 ENCOUNTER — Telehealth: Payer: Self-pay

## 2023-07-02 ENCOUNTER — Other Ambulatory Visit (HOSPITAL_COMMUNITY): Payer: Self-pay

## 2023-07-02 DIAGNOSIS — M25511 Pain in right shoulder: Secondary | ICD-10-CM | POA: Diagnosis not present

## 2023-07-02 DIAGNOSIS — M25411 Effusion, right shoulder: Secondary | ICD-10-CM | POA: Diagnosis not present

## 2023-07-02 DIAGNOSIS — M25611 Stiffness of right shoulder, not elsewhere classified: Secondary | ICD-10-CM | POA: Diagnosis not present

## 2023-07-02 DIAGNOSIS — Z4789 Encounter for other orthopedic aftercare: Secondary | ICD-10-CM | POA: Diagnosis not present

## 2023-07-02 NOTE — Telephone Encounter (Signed)
 Pharmacy Patient Advocate Encounter   Received notification from CoverMyMeds that prior authorization for Emgality is required/requested.   Insurance verification completed.   The patient is insured through  [employer]  .   Per test claim: Refill too soon. PA is not needed at this time. Medication was filled Not provided per test claim. Next eligible fill date is 07/08/2023.

## 2023-07-09 ENCOUNTER — Telehealth: Payer: Self-pay

## 2023-07-09 ENCOUNTER — Other Ambulatory Visit (HOSPITAL_COMMUNITY): Payer: Self-pay

## 2023-07-09 DIAGNOSIS — J4540 Moderate persistent asthma, uncomplicated: Secondary | ICD-10-CM | POA: Diagnosis not present

## 2023-07-09 DIAGNOSIS — J3081 Allergic rhinitis due to animal (cat) (dog) hair and dander: Secondary | ICD-10-CM | POA: Diagnosis not present

## 2023-07-09 DIAGNOSIS — J3089 Other allergic rhinitis: Secondary | ICD-10-CM | POA: Diagnosis not present

## 2023-07-09 DIAGNOSIS — J301 Allergic rhinitis due to pollen: Secondary | ICD-10-CM | POA: Diagnosis not present

## 2023-07-09 NOTE — Telephone Encounter (Signed)
 Pharmacy Patient Advocate Encounter   Received notification from CoverMyMeds that prior authorization for Ubrelvy  100MG  tablets is required/requested.   Insurance verification completed.   The patient is insured through  [employer]  .   Per test claim: Refill too soon. PA is not needed at this time. Medication was filled 06/22/2023. Next eligible fill date is 07/22/2023.

## 2023-07-10 ENCOUNTER — Ambulatory Visit: Payer: BC Managed Care – PPO | Admitting: Neurology

## 2023-07-10 DIAGNOSIS — Z4789 Encounter for other orthopedic aftercare: Secondary | ICD-10-CM | POA: Diagnosis not present

## 2023-07-10 DIAGNOSIS — M25511 Pain in right shoulder: Secondary | ICD-10-CM | POA: Diagnosis not present

## 2023-07-10 DIAGNOSIS — M25411 Effusion, right shoulder: Secondary | ICD-10-CM | POA: Diagnosis not present

## 2023-07-10 DIAGNOSIS — M25611 Stiffness of right shoulder, not elsewhere classified: Secondary | ICD-10-CM | POA: Diagnosis not present

## 2023-07-17 DIAGNOSIS — Z4789 Encounter for other orthopedic aftercare: Secondary | ICD-10-CM | POA: Diagnosis not present

## 2023-07-17 DIAGNOSIS — M25611 Stiffness of right shoulder, not elsewhere classified: Secondary | ICD-10-CM | POA: Diagnosis not present

## 2023-07-17 DIAGNOSIS — M25411 Effusion, right shoulder: Secondary | ICD-10-CM | POA: Diagnosis not present

## 2023-07-17 DIAGNOSIS — M25511 Pain in right shoulder: Secondary | ICD-10-CM | POA: Diagnosis not present

## 2023-07-19 DIAGNOSIS — J3089 Other allergic rhinitis: Secondary | ICD-10-CM | POA: Diagnosis not present

## 2023-07-19 DIAGNOSIS — J301 Allergic rhinitis due to pollen: Secondary | ICD-10-CM | POA: Diagnosis not present

## 2023-07-19 DIAGNOSIS — J3081 Allergic rhinitis due to animal (cat) (dog) hair and dander: Secondary | ICD-10-CM | POA: Diagnosis not present

## 2023-07-23 ENCOUNTER — Other Ambulatory Visit: Payer: Self-pay

## 2023-07-23 ENCOUNTER — Encounter (HOSPITAL_BASED_OUTPATIENT_CLINIC_OR_DEPARTMENT_OTHER): Payer: Self-pay | Admitting: Orthopaedic Surgery

## 2023-07-23 DIAGNOSIS — M25411 Effusion, right shoulder: Secondary | ICD-10-CM | POA: Diagnosis not present

## 2023-07-23 DIAGNOSIS — Z4789 Encounter for other orthopedic aftercare: Secondary | ICD-10-CM | POA: Diagnosis not present

## 2023-07-23 DIAGNOSIS — M25511 Pain in right shoulder: Secondary | ICD-10-CM | POA: Diagnosis not present

## 2023-07-23 DIAGNOSIS — M25611 Stiffness of right shoulder, not elsewhere classified: Secondary | ICD-10-CM | POA: Diagnosis not present

## 2023-07-26 DIAGNOSIS — J3081 Allergic rhinitis due to animal (cat) (dog) hair and dander: Secondary | ICD-10-CM | POA: Diagnosis not present

## 2023-07-26 DIAGNOSIS — J3089 Other allergic rhinitis: Secondary | ICD-10-CM | POA: Diagnosis not present

## 2023-07-26 DIAGNOSIS — J301 Allergic rhinitis due to pollen: Secondary | ICD-10-CM | POA: Diagnosis not present

## 2023-07-30 ENCOUNTER — Encounter (HOSPITAL_BASED_OUTPATIENT_CLINIC_OR_DEPARTMENT_OTHER): Payer: Self-pay | Admitting: Orthopaedic Surgery

## 2023-07-30 ENCOUNTER — Encounter (HOSPITAL_BASED_OUTPATIENT_CLINIC_OR_DEPARTMENT_OTHER)
Admission: RE | Disposition: A | Discharge status: Home / Self Care | Payer: Self-pay | Source: Home / Self Care | Attending: Orthopaedic Surgery

## 2023-07-30 ENCOUNTER — Ambulatory Visit (HOSPITAL_BASED_OUTPATIENT_CLINIC_OR_DEPARTMENT_OTHER)
Admission: RE | Admit: 2023-07-30 | Discharge: 2023-07-30 | Disposition: A | Discharge status: Home / Self Care | Attending: Orthopaedic Surgery | Admitting: Orthopaedic Surgery

## 2023-07-30 ENCOUNTER — Other Ambulatory Visit: Payer: Self-pay

## 2023-07-30 ENCOUNTER — Ambulatory Visit (HOSPITAL_BASED_OUTPATIENT_CLINIC_OR_DEPARTMENT_OTHER): Admitting: Anesthesiology

## 2023-07-30 DIAGNOSIS — J45909 Unspecified asthma, uncomplicated: Secondary | ICD-10-CM | POA: Insufficient documentation

## 2023-07-30 DIAGNOSIS — M25311 Other instability, right shoulder: Secondary | ICD-10-CM | POA: Diagnosis not present

## 2023-07-30 DIAGNOSIS — Q7962 Hypermobile Ehlers-Danlos syndrome: Secondary | ICD-10-CM | POA: Diagnosis not present

## 2023-07-30 DIAGNOSIS — S43431A Superior glenoid labrum lesion of right shoulder, initial encounter: Secondary | ICD-10-CM | POA: Diagnosis not present

## 2023-07-30 DIAGNOSIS — X58XXXA Exposure to other specified factors, initial encounter: Secondary | ICD-10-CM | POA: Diagnosis not present

## 2023-07-30 DIAGNOSIS — G8918 Other acute postprocedural pain: Secondary | ICD-10-CM | POA: Diagnosis not present

## 2023-07-30 DIAGNOSIS — Z01818 Encounter for other preprocedural examination: Secondary | ICD-10-CM

## 2023-07-30 DIAGNOSIS — F419 Anxiety disorder, unspecified: Secondary | ICD-10-CM | POA: Diagnosis not present

## 2023-07-30 DIAGNOSIS — F32A Depression, unspecified: Secondary | ICD-10-CM | POA: Diagnosis not present

## 2023-07-30 DIAGNOSIS — S43491A Other sprain of right shoulder joint, initial encounter: Secondary | ICD-10-CM | POA: Diagnosis not present

## 2023-07-30 HISTORY — DX: Other complications of anesthesia, initial encounter: T88.59XA

## 2023-07-30 HISTORY — PX: SHOULDER ARTHROSCOPY WITH LABRAL REPAIR: SHX5691

## 2023-07-30 LAB — POCT PREGNANCY, URINE: Preg Test, Ur: NEGATIVE

## 2023-07-30 SURGERY — ARTHROSCOPY, SHOULDER, WITH GLENOID LABRUM REPAIR
Anesthesia: General | Site: Shoulder | Laterality: Right

## 2023-07-30 MED ORDER — CEFAZOLIN SODIUM-DEXTROSE 2-4 GM/100ML-% IV SOLN
INTRAVENOUS | Status: AC
  Filled 2023-07-30: qty 100

## 2023-07-30 MED ORDER — ACETAMINOPHEN 500 MG PO TABS
ORAL_TABLET | ORAL | Status: AC
  Filled 2023-07-30: qty 2

## 2023-07-30 MED ORDER — FENTANYL CITRATE (PF) 250 MCG/5ML IJ SOLN
INTRAMUSCULAR | Status: DC | PRN
  Administered 2023-07-30: 50 ug via INTRAVENOUS

## 2023-07-30 MED ORDER — TRANEXAMIC ACID-NACL 1000-0.7 MG/100ML-% IV SOLN
INTRAVENOUS | Status: AC
  Filled 2023-07-30: qty 100

## 2023-07-30 MED ORDER — DEXAMETHASONE SODIUM PHOSPHATE 10 MG/ML IJ SOLN
INTRAMUSCULAR | Status: AC
  Filled 2023-07-30: qty 1

## 2023-07-30 MED ORDER — GABAPENTIN 300 MG PO CAPS
300.0000 mg | ORAL_CAPSULE | Freq: Once | ORAL | Status: AC
  Administered 2023-07-30: 300 mg via ORAL

## 2023-07-30 MED ORDER — ONDANSETRON HCL 4 MG/2ML IJ SOLN
INTRAMUSCULAR | Status: AC
  Filled 2023-07-30: qty 2

## 2023-07-30 MED ORDER — PROPOFOL 10 MG/ML IV BOLUS
INTRAVENOUS | Status: AC
  Filled 2023-07-30: qty 20

## 2023-07-30 MED ORDER — FENTANYL CITRATE (PF) 100 MCG/2ML IJ SOLN
INTRAMUSCULAR | Status: AC
  Filled 2023-07-30: qty 2

## 2023-07-30 MED ORDER — FENTANYL CITRATE (PF) 100 MCG/2ML IJ SOLN
100.0000 ug | Freq: Once | INTRAMUSCULAR | Status: AC
  Administered 2023-07-30: 100 ug via INTRAVENOUS

## 2023-07-30 MED ORDER — GABAPENTIN 300 MG PO CAPS
ORAL_CAPSULE | ORAL | Status: AC
  Filled 2023-07-30: qty 1

## 2023-07-30 MED ORDER — SODIUM CHLORIDE 0.9 % IR SOLN
Status: DC | PRN
  Administered 2023-07-30: 6000 mL

## 2023-07-30 MED ORDER — AMISULPRIDE (ANTIEMETIC) 5 MG/2ML IV SOLN
INTRAVENOUS | Status: AC
  Filled 2023-07-30: qty 4

## 2023-07-30 MED ORDER — HYDROMORPHONE HCL 1 MG/ML IJ SOLN: 0.2500 mg | INTRAMUSCULAR | Status: DC | PRN

## 2023-07-30 MED ORDER — PHENYLEPHRINE 80 MCG/ML (10ML) SYRINGE FOR IV PUSH (FOR BLOOD PRESSURE SUPPORT)
PREFILLED_SYRINGE | INTRAVENOUS | Status: AC
  Filled 2023-07-30: qty 10

## 2023-07-30 MED ORDER — LACTATED RINGERS IV SOLN: INTRAVENOUS | Status: DC

## 2023-07-30 MED ORDER — TRANEXAMIC ACID-NACL 1000-0.7 MG/100ML-% IV SOLN
1000.0000 mg | INTRAVENOUS | Status: AC
  Administered 2023-07-30: 1000 mg via INTRAVENOUS

## 2023-07-30 MED ORDER — VANCOMYCIN HCL IN DEXTROSE 1-5 GM/200ML-% IV SOLN
INTRAVENOUS | Status: AC
  Filled 2023-07-30: qty 200

## 2023-07-30 MED ORDER — CEFAZOLIN SODIUM-DEXTROSE 2-3 GM-%(50ML) IV SOLR
INTRAVENOUS | Status: DC | PRN
  Administered 2023-07-30: 2 g via INTRAVENOUS

## 2023-07-30 MED ORDER — ONDANSETRON HCL 4 MG/2ML IJ SOLN
INTRAMUSCULAR | Status: DC | PRN
  Administered 2023-07-30: 4 mg via INTRAVENOUS

## 2023-07-30 MED ORDER — LIDOCAINE 2% (20 MG/ML) 5 ML SYRINGE
INTRAMUSCULAR | Status: DC | PRN
  Administered 2023-07-30: 60 mg via INTRAVENOUS

## 2023-07-30 MED ORDER — DEXAMETHASONE SODIUM PHOSPHATE 10 MG/ML IJ SOLN
INTRAMUSCULAR | Status: DC | PRN
Start: 2023-07-30 — End: 2023-07-30
  Administered 2023-07-30: 10 mg via INTRAVENOUS

## 2023-07-30 MED ORDER — MIDAZOLAM HCL 2 MG/2ML IJ SOLN
INTRAMUSCULAR | Status: AC
  Filled 2023-07-30: qty 2

## 2023-07-30 MED ORDER — LEVOFLOXACIN IN D5W 500 MG/100ML IV SOLN: 500.0000 mg | INTRAVENOUS | Status: DC

## 2023-07-30 MED ORDER — PROPOFOL 10 MG/ML IV BOLUS
INTRAVENOUS | Status: DC | PRN
  Administered 2023-07-30: 100 mg via INTRAVENOUS
  Administered 2023-07-30: 200 ug/kg/min via INTRAVENOUS
  Administered 2023-07-30: 100 mg via INTRAVENOUS

## 2023-07-30 MED ORDER — LEVOFLOXACIN IN D5W 500 MG/100ML IV SOLN
INTRAVENOUS | Status: AC
  Filled 2023-07-30: qty 100

## 2023-07-30 MED ORDER — PROPOFOL 1000 MG/100ML IV EMUL
INTRAVENOUS | Status: AC
  Filled 2023-07-30: qty 200

## 2023-07-30 MED ORDER — AMISULPRIDE (ANTIEMETIC) 5 MG/2ML IV SOLN
10.0000 mg | Freq: Once | INTRAVENOUS | Status: AC
  Administered 2023-07-30: 10 mg via INTRAVENOUS

## 2023-07-30 MED ORDER — BUPIVACAINE LIPOSOME 1.3 % IJ SUSP
INTRAMUSCULAR | Status: DC | PRN
  Administered 2023-07-30: 10 mL via PERINEURAL

## 2023-07-30 MED ORDER — ACETAMINOPHEN 500 MG PO TABS
1000.0000 mg | ORAL_TABLET | Freq: Once | ORAL | Status: AC
  Administered 2023-07-30: 1000 mg via ORAL

## 2023-07-30 MED ORDER — PHENYLEPHRINE HCL-NACL 20-0.9 MG/250ML-% IV SOLN
INTRAVENOUS | Status: DC | PRN
  Administered 2023-07-30: 80 ug via INTRAVENOUS
  Administered 2023-07-30: 160 ug via INTRAVENOUS

## 2023-07-30 MED ORDER — LIDOCAINE 2% (20 MG/ML) 5 ML SYRINGE
INTRAMUSCULAR | Status: AC
  Filled 2023-07-30: qty 5

## 2023-07-30 MED ORDER — VANCOMYCIN HCL IN DEXTROSE 1-5 GM/200ML-% IV SOLN: 1000.0000 mg | INTRAVENOUS | Status: DC

## 2023-07-30 MED ORDER — MIDAZOLAM HCL 2 MG/2ML IJ SOLN
1.0000 mg | Freq: Once | INTRAMUSCULAR | Status: AC
  Administered 2023-07-30: 1 mg via INTRAVENOUS

## 2023-07-30 MED ORDER — ROCURONIUM BROMIDE 10 MG/ML (PF) SYRINGE
PREFILLED_SYRINGE | INTRAVENOUS | Status: DC | PRN
  Administered 2023-07-30: 50 mg via INTRAVENOUS

## 2023-07-30 MED ORDER — SUGAMMADEX SODIUM 200 MG/2ML IV SOLN
INTRAVENOUS | Status: DC | PRN
  Administered 2023-07-30: 200 mg via INTRAVENOUS

## 2023-07-30 MED ORDER — ROCURONIUM BROMIDE 10 MG/ML (PF) SYRINGE
PREFILLED_SYRINGE | INTRAVENOUS | Status: AC
Start: 2023-07-30 — End: ?
  Filled 2023-07-30: qty 10

## 2023-07-30 MED ORDER — BUPIVACAINE-EPINEPHRINE (PF) 0.5% -1:200000 IJ SOLN
INTRAMUSCULAR | Status: DC | PRN
  Administered 2023-07-30: 15 mL via PERINEURAL

## 2023-07-30 SURGICAL SUPPLY — 52 items
ANCHOR SUT 1.8 FIBERTAK SB KL (Anchor) IMPLANT
BLADE EXCALIBUR 4.0X13 (MISCELLANEOUS) IMPLANT
BLADE SHAVER TORPEDO 4X13 (MISCELLANEOUS) ×1 IMPLANT
BUR SURG 4D 13L RD FLUTE (BUR) IMPLANT
BURR OVAL 8 FLU 4.0X13 (MISCELLANEOUS) IMPLANT
CANNULA 5.75X71 LONG (CANNULA) IMPLANT
CANNULA 8.25X9 (CANNULA) IMPLANT
CANNULA PASSPORT 5 (CANNULA) IMPLANT
CANNULA PASSPORT BUTTON 10-40 (CANNULA) IMPLANT
CANNULA TWIST IN 8.25X7CM (CANNULA) IMPLANT
CHLORAPREP W/TINT 26 (MISCELLANEOUS) ×1 IMPLANT
COOLER ICEMAN CLASSIC (MISCELLANEOUS) ×1 IMPLANT
DRAPE IMP U-DRAPE 54X76 (DRAPES) ×1 IMPLANT
DRAPE INCISE IOBAN 66X45 STRL (DRAPES) ×1 IMPLANT
DRAPE SHOULDER BEACH CHAIR (DRAPES) ×1 IMPLANT
DRAPE U-SHAPE 47X51 STRL (DRAPES) ×2 IMPLANT
DRSG TEGADERM 4X4.75 (GAUZE/BANDAGES/DRESSINGS) IMPLANT
DW OUTFLOW CASSETTE/TUBE SET (MISCELLANEOUS) ×1 IMPLANT
GAUZE PAD ABD 8X10 STRL (GAUZE/BANDAGES/DRESSINGS) ×1 IMPLANT
GAUZE SPONGE 4X4 12PLY STRL (GAUZE/BANDAGES/DRESSINGS) ×1 IMPLANT
GAUZE XEROFORM 1X8 LF (GAUZE/BANDAGES/DRESSINGS) ×1 IMPLANT
GLOVE BIO SURGEON STRL SZ 6 (GLOVE) ×2 IMPLANT
GLOVE BIO SURGEON STRL SZ7.5 (GLOVE) ×1 IMPLANT
GLOVE BIOGEL PI IND STRL 6.5 (GLOVE) ×1 IMPLANT
GLOVE BIOGEL PI IND STRL 8 (GLOVE) ×1 IMPLANT
GLOVE ECLIPSE 8.0 STRL XLNG CF (GLOVE) ×1 IMPLANT
GOWN STRL REUS W/ TWL LRG LVL3 (GOWN DISPOSABLE) ×2 IMPLANT
GOWN STRL REUS W/TWL XL LVL3 (GOWN DISPOSABLE) ×1 IMPLANT
KIT CVD SPEAR FBRTK 1.8 DRILL (KITS) IMPLANT
KIT PERC INSERT 3.0 KNTLS (KITS) IMPLANT
KIT PERCUTANEOUS FBRTK 1.8 (KITS) IMPLANT
KIT PUSHLOCK 2.9 HIP (KITS) IMPLANT
KIT STR SPEAR 1.8 FBRTK DISP (KITS) IMPLANT
LASSO 90 CVE QUICKPAS (DISPOSABLE) IMPLANT
LASSO CRESCENT QUICKPASS (SUTURE) IMPLANT
MANIFOLD NEPTUNE II (INSTRUMENTS) ×1 IMPLANT
NDL SAFETY ECLIPSE 18X1.5 (NEEDLE) ×1 IMPLANT
PACK ARTHROSCOPY DSU (CUSTOM PROCEDURE TRAY) ×1 IMPLANT
PACK BASIN DAY SURGERY FS (CUSTOM PROCEDURE TRAY) ×1 IMPLANT
PAD COLD SHLDR WRAP-ON (PAD) ×1 IMPLANT
SHEET MEDIUM DRAPE 40X70 STRL (DRAPES) ×1 IMPLANT
SLEEVE ARM SUSPENSION SYSTEM (MISCELLANEOUS) ×1 IMPLANT
SLEEVE SCD COMPRESS KNEE MED (STOCKING) ×1 IMPLANT
SLING S3 LATERAL DISP (MISCELLANEOUS) ×1 IMPLANT
SUT ETHILON 3 0 PS 1 (SUTURE) ×1 IMPLANT
SUTURE FIBERWR #2 38 T-5 BLUE (SUTURE) IMPLANT
SUTURE TAPE TIGERLINK 1.3MM BL (SUTURE) IMPLANT
SYR 5ML LL (SYRINGE) ×1 IMPLANT
TOWEL GREEN STERILE FF (TOWEL DISPOSABLE) ×2 IMPLANT
TUBE CONNECTING 20X1/4 (TUBING) ×1 IMPLANT
TUBING ARTHROSCOPY IRRIG 16FT (MISCELLANEOUS) ×1 IMPLANT
WAND ABLATOR APOLLO I90 (BUR) ×1 IMPLANT

## 2023-07-30 NOTE — Discharge Instructions (Addendum)
 Discharge Instructions    Attending Surgeon: Pamela Harada, MD Office Phone Number: [phone]   Diagnosis and Procedures:    Surgeries Performed: Right shoulder labral repair with capsulorraphy  Discharge Plan:    Diet: Resume usual diet. Begin with light or bland foods.  Drink plenty of fluids.  Activity:  Keep sling and dressing in place until your follow up visit in Physical Therapy You are advised to go home directly from the hospital or surgical center. Restrict your activities.  GENERAL INSTRUCTIONS: 1.  Keep your surgical site elevated above your heart for at least 5-7 days or longer to prevent swelling. This will improve your comfort and your overall recovery following surgery.     2. Please call Dr. Verline Glow office at [phone] with questions Monday-Friday during business hours. If no one answers, please leave a message and someone should get back to the patient within 24 hours. For emergencies please call 911 or proceed to the emergency room.   3. Patient to notify surgical team if experiences any of the following: Bowel/Bladder dysfunction, uncontrolled pain, nerve/muscle weakness, incision with increased drainage or redness, nausea/vomiting and Fever greater than 101.0 F.  Be alert for signs of infection including redness, streaking, odor, fever or chills. Be alert for excessive pain or bleeding and notify your surgeon immediately.  WOUND INSTRUCTIONS:   Leave your dressing/cast/splint in place until your post operative visit.  Keep it clean and dry.  Always keep the incision clean and dry until the staples/sutures are removed. If there is no drainage from the incision you should keep it open to air. If there is drainage from the incision you must keep it covered at all times until the drainage stops  Do not soak in a bath tub, hot tub, pool, lake or other body of water until 21 days after your surgery and your incision is completely dry and healed.  If  you have removable sutures (or staples) they must be removed 10-14 days (unless otherwise instructed) from the day of your surgery.     1)  Elevate the extremity as much as possible.  2)  Keep the dressing clean and dry.  3)  Please call us  if the dressing becomes wet or dirty.  4)  If you are experiencing worsening pain or worsening swelling, please call.     MEDICATIONS: Resume all previous home medications at the previous prescribed dose and frequency unless otherwise noted Start taking the  pain medications on an as-needed basis as prescribed  Please taper down pain medication over the next week following surgery.  Ideally you should not require a refill of any narcotic pain medication.  Take pain medication with food to minimize nausea. In addition to the prescribed pain medication, you may take over-the-counter pain relievers such as Tylenol .  Do NOT take additional tylenol  if your pain medication already has tylenol  in it.  Aspirin  325mg  daily per bottle instructions. Narcotic Policy: Per [facility] clinic policy, our goal is ensure optimal postoperative pain control with a multimodal pain management strategy. For all [facility] patients, our goal is to wean post-operative narcotic medications by 6 weeks post-operatively, and many times sooner. If this is not possible due to utilization of pain medication prior to surgery, your [facility] doctor will support your acute post-operative pain control for the first 6 weeks postoperatively, with a plan to transition you back to your primary pain team following that. Max Spain will work to ensure a Therapist, occupational.  FOLLOWUP INSTRUCTIONS: 1. Follow up at the Physical Therapy Clinic 3-4 days following surgery. This appointment should be scheduled unless other arrangements have been made.The Physical Therapy scheduling number is [phone] if an appointment has not already been arranged.  2. Contact Dr. Verline Glow office during office hours at  [phone] or the practice after hours line at [phone] for non-emergencies. For medical emergencies call 911.   Discharge Location: Home   Regional Anesthesia Blocks  1. You may not be able to move or feel the "blocked" extremity after a regional anesthetic block. This may last may last from 3-48 hours after placement, but it will go away. The length of time depends on the medication injected and your individual response to the medication. As the nerves start to wake up, you may experience tingling as the movement and feeling returns to your extremity. If the numbness and inability to move your extremity has not gone away after 48 hours, please call your surgeon.   2. The extremity that is blocked will need to be protected until the numbness is gone and the strength has returned. Because you cannot feel it, you will need to take extra care to avoid injury. Because it may be weak, you may have difficulty moving it or using it. You may not know what position it is in without looking at it while the block is in effect.  3. For blocks in the legs and feet, returning to weight bearing and walking needs to be done carefully. You will need to wait until the numbness is entirely gone and the strength has returned. You should be able to move your leg and foot normally before you try and bear weight or walk. You will need someone to be with you when you first try to ensure you do not fall and possibly risk injury.  4. Bruising and tenderness at the needle site are common side effects and will resolve in a few days.  5. Persistent numbness or new problems with movement should be communicated to the surgeon or the [facility] [phone] [facility] [phone]).   Information for Discharge Teaching: EXPAREL  (bupivacaine  liposome injectable suspension)   Pain relief is important to your recovery. The goal is to control your pain so you can move easier and return to  your normal activities as soon as possible after your procedure. Your physician may use several types of medicines to manage pain, swelling, and more.  Your surgeon or anesthesiologist gave you EXPAREL (bupivacaine ) to help control your pain after surgery.  EXPAREL  is a local anesthetic designed to release slowly over an extended period of time to provide pain relief by numbing the tissue around the surgical site. EXPAREL  is designed to release pain medication over time and can control pain for up to 72 hours. Depending on how you respond to EXPAREL , you may require less pain medication during your recovery. EXPAREL  can help reduce or eliminate the need for opioids during the first few days after surgery when pain relief is needed the most. EXPAREL  is not an opioid and is not addictive. It does not cause sleepiness or sedation.   Important! A teal colored band has been placed on your arm with the date, time and amount of EXPAREL  you have received. Please leave this armband in place for the full 96 hours following administration, and then you may remove the band. If you return to the hospital for any reason within 96 hours following the administration of EXPAREL ,  the armband provides important information that your health care providers to know, and alerts them that you have received this anesthetic.    Possible side effects of EXPAREL : Temporary loss of sensation or ability to move in the area where medication was injected. Nausea, vomiting, constipation Rarely, numbness and tingling in your mouth or lips, lightheadedness, or anxiety may occur. Call your doctor right away if you think you may be experiencing any of these sensations, or if you have other questions regarding possible side effects.  Follow all other discharge instructions given to you by your surgeon or nurse. Eat a healthy diet and drink plenty of water or other fluids.  Next dose of Tylenol  due after 2pm today if needed  Post  Anesthesia Home Care Instructions  Activity: Get plenty of rest for the remainder of the day. A responsible individual must stay with you for 24 hours following the procedure.  For the next 24 hours, DO NOT: -Drive a car -Advertising copywriter -Drink alcoholic beverages -Take any medication unless instructed by your physician -Make any legal decisions or sign important papers.  Meals: Start with liquid foods such as gelatin or soup. Progress to regular foods as tolerated. Avoid greasy, spicy, heavy foods. If nausea and/or vomiting occur, drink only clear liquids until the nausea and/or vomiting subsides. Call your physician if vomiting continues.  Special Instructions/Symptoms: Your throat may feel dry or sore from the anesthesia or the breathing tube placed in your throat during surgery. If this causes discomfort, gargle with warm salt water. The discomfort should disappear within 24 hours.  If you had a scopolamine  patch placed behind your ear for the management of post- operative nausea and/or vomiting:  1. The medication in the patch is effective for 72 hours, after which it should be removed.  Wrap patch in a tissue and discard in the trash. Wash hands thoroughly with soap and water. 2. You may remove the patch earlier than 72 hours if you experience unpleasant side effects which may include dry mouth, dizziness or visual disturbances. 3. Avoid touching the patch. Wash your hands with soap and water after contact with the patch.

## 2023-07-30 NOTE — Anesthesia Procedure Notes (Signed)
 Procedure Name: Intubation Date/Time: 07/30/2023 9:34 AM  Performed by: Adolphus Hoops, CRNAPre-anesthesia Checklist: Patient identified, Emergency Drugs available, Suction available and Patient being monitored Patient Re-evaluated:Patient Re-evaluated prior to induction Oxygen Delivery Method: Circle System Utilized Preoxygenation: Pre-oxygenation with 100% oxygen Induction Type: IV induction Ventilation: Mask ventilation without difficulty Laryngoscope Size: Mac and 3 Grade View: Grade I Tube type: Oral Tube size: 7.0 mm Number of attempts: 1 Airway Equipment and Method: Stylet and Oral airway Placement Confirmation: ETT inserted through vocal cords under direct vision, positive ETCO2 and breath sounds checked- equal and bilateral Secured at: 21 cm Tube secured with: Tape Dental Injury: Teeth and Oropharynx as per pre-operative assessment

## 2023-07-30 NOTE — Brief Op Note (Signed)
   Brief Op Note  Date of Surgery: 07/30/2023  Preoperative Diagnosis: RIGHT SHOULDER LABRAL TEAR  Postoperative Diagnosis: same  Procedure: Procedure(s): ARTHROSCOPY, SHOULDER, WITH GLENOID LABRUM REPAIR  Implants: Implant Name Type Inv. Item Serial No. Manufacturer Lot No. LRB No. Used Action  ANCHOR SUT 1.8 FIBERTAK SB KL - [id number] Anchor ANCHOR SUT 1.8 FIBERTAK SB KL  [employer] [id number] Right 1 Implanted  ANCHOR SUT 1.8 FIBERTAK SB KL - [id number] Anchor ANCHOR SUT 1.8 FIBERTAK SB KL  [employer] [id number] Right 1 Implanted  ANCHOR SUT 1.8 FIBERTAK SB KL - [id number] Anchor ANCHOR SUT 1.8 FIBERTAK SB KL  [employer] [id number] Right 1 Implanted  ANCHOR SUT 1.8 FIBERTAK SB KL - [id number] Anchor ANCHOR SUT 1.8 [employer] [id number] Right 1 Implanted    Surgeons: Surgeon(s): Wilhelmenia Harada, MD  Anesthesia: General    Estimated Blood Loss: See anesthesia record  Complications: None  Condition to PACU: Stable  Carmina Chris, MD 07/30/2023 10:34 AM

## 2023-07-30 NOTE — Transfer of Care (Signed)
 Immediate Anesthesia Transfer of Care Note  Patient: Pamela Norton  Procedure(s) Performed: ARTHROSCOPY, SHOULDER, WITH GLENOID LABRUM REPAIR (Right: Shoulder)  Patient Location: PACU  Anesthesia Type:General  Level of Consciousness: drowsy  Airway & Oxygen Therapy: Patient Spontanous Breathing and Patient connected to face mask oxygen  Post-op Assessment: Report given to RN and Post -op Vital signs reviewed and stable  Post vital signs: Reviewed and stable  Last Vitals:  Vitals Value Taken Time  BP    Temp    Pulse 105 07/30/23 1035  Resp    SpO2 99 % 07/30/23 1035  Vitals shown include unfiled device data.  Last Pain:  Vitals:   07/30/23 0752  TempSrc: Temporal  PainSc: 2          Complications: No notable events documented.

## 2023-07-30 NOTE — H&P (Signed)
 Expand All Collapse All       Chief Complaint: Right shoulder pain        History of Present Illness:    06/12/2023: Presents today for follow-up of her right shoulder as well as MRI discussion.  She is still symptomatic and having recurrent subluxation episodes   Pamela Norton is a 33 y.o. female presents today with right shoulder pain.  Status post right shoulder subpectoral biceps tenodesis with Dr. Leighton Punches in June 2024.  She is presenting today as she is having persistent shoulder pain and subluxations with overhead activity.  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  This is her dominant arm.  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  She has been extremely rigorous with her postoperative physical therapy but is having persistent anterior glenohumeral type pain.  Dates that she has had several subluxation episodes since this time.       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        Social History         Socioeconomic History   Marital status: Single      Spouse name: Not on file   Number of children: Not on file   Years of education: Not on file   Highest education level: Not on file  Occupational History   Not on file  Tobacco Use   Smoking status: Never   Smokeless tobacco: Never  Vaping Use   Vaping status: Never Used  Substance and Sexual Activity   Alcohol use: Yes      Alcohol/week: 1.0 standard drink of alcohol       Types: 1 Cans of beer per week   Drug use: No   Sexual activity: Never      Birth control/protection: Pill  Other Topics Concern   Not on file  Social History Narrative   Not on file    Social Drivers of Health    Financial Resource Strain: Not on file  Food Insecurity: Not on file  Transportation Needs: Not on file  Physical Activity: Not on file  Stress: Not on file  Social Connections: Not on file         Family History  Problem Relation Age of Onset   Thyroid  disease Mother     Hypothyroidism Mother     Osteoarthritis Mother     Fibromyalgia Mother     Restless legs syndrome Mother     Thyroid  cancer Mother 61        Papillary   Melanoma Mother 33   Colon polyps Mother          cscope every 3 years   Migraines Mother     Colon  polyps Father     Colon cancer Maternal Grandmother 32   Kidney cancer Maternal Grandfather 92   Dementia Paternal Grandmother     Throat cancer Paternal Grandfather     Melanoma Paternal Grandfather     Breast cancer Other          Allergies       Allergies  Allergen Reactions   Clarithromycin Anxiety, Nausea Only and Shortness Of Breath   Amphetamine-Dextroamphetamine        Other reaction(s): nausea   Cefaclor        REACTION: Hives Other reaction(s): hives   Food        Multiple Food Allergies: bell Peppers, Garlic, Shellfish, Pine Nuts, Peanuts, Soy, Melon, Watermelon, Honeydew, Cantaloupe, Squash, Pumpkin, Gourd, Zucchini   Meloxicam        Other reaction(s): migraine and hives   Peanuts [Peanut Oil]     Prednisone        Other reaction(s): tachycardia   Soy Allergy (Obsolete)     Coconut (Cocos Nucifera) Dermatitis, Hives and Rash   Latex Hives and Rash            Current Outpatient Medications  Medication Sig Dispense Refill   acetaminophen  (TYLENOL ) 500 MG tablet Take 1 tablet (500 mg total) by mouth every 8 (eight) hours for 10 days. 30 tablet 0   aspirin  EC 325 MG tablet Take 1 tablet (325 mg total) by  mouth daily. 14 tablet 0   HYDROmorphone  (DILAUDID ) 2 MG tablet Take 1 tablet (2 mg total) by mouth every 4 (four) hours as needed for severe pain (pain score 7-10). 30 tablet 0   ibuprofen  (ADVIL ) 800 MG tablet Take 1 tablet (800 mg total) by mouth every 8 (eight) hours for 10 days. Please take with food, please alternate with acetaminophen  30 tablet 0   scopolamine  (TRANSDERM-SCOP) 1 MG/3DAYS Place 1 patch (1.5 mg total) onto the skin every 3 (three) days. 10 patch 0   albuterol  (ACCUNEB ) 0.63 MG/3ML nebulizer solution Take 1 ampule by nebulization every 6 (six) hours as needed for Wheezing.       azelastine (ASTELIN) 0.1 % nasal spray Place into both nostrils.       Baclofen 20 MG/20ML SOLN 2 (two) times daily as needed.       budesonide-formoterol (SYMBICORT) 160-4.5 MCG/ACT inhaler USE 2 PUFFS TWICE A DAY       celecoxib  (CELEBREX ) 100 MG capsule Take 1 capsule (100 mg total) by mouth 2 (two) times daily. 60 capsule 2   EPINEPHrine 0.3 mg/0.3 mL IJ SOAJ injection See admin instructions.   0   fexofenadine (ALLEGRA) 180 MG tablet Take 180 mg by mouth in the morning and at bedtime.       FLUoxetine (PROZAC) 20 MG capsule Take 20 mg by mouth every morning.   2   Galcanezumab -gnlm (EMGALITY ) 120 MG/ML SOAJ INJECT 1 PEN INTO THE SKIN EVERY 30 DAYS 1 mL 6   montelukast (SINGULAIR) 10 MG tablet Take 10 mg by mouth at bedtime.       Multiple Vitamin (MULTIVITAMIN) capsule Take 1 capsule by mouth daily.       omeprazole (PRILOSEC) 20 MG capsule Take 20 mg by mouth daily.       SPIRIVA RESPIMAT 1.25 MCG/ACT AERS SMARTSIG:2 Puff(s) Via Inhaler Daily       triamcinolone  (NASACORT ) 55 MCG/ACT AERO nasal inhaler Place 2 sprays into the nose daily.       UBRELVY  100 MG TABS  Take 100 mg by mouth if needed as close to onset of migraine as possible. May repeat in 2 hours if headache persists or returns. Do not exceed 2 tablets in 24 hours. 10 tablet 11      No current facility-administered medications for  this visit.      Imaging Results (Last 48 hours)  No results found.     Review of Systems:   A ROS was performed including pertinent positives and negatives as documented in the HPI.   Physical Exam :   Constitutional: NAD and appears stated age Neurological: Alert and oriented Psych: Appropriate affect and cooperative There were no vitals taken for this visit.    Comprehensive Musculoskeletal Exam:     Musculoskeletal Exam      Inspection Right Left  Skin No atrophy or winging No atrophy or winging  Palpation      Tenderness Glenohumeral None  Range of Motion      Flexion (passive) 170 170  Flexion (active) 170 170  Abduction 170 170  ER at the side 70 70  Can reach behind back to T12 T12  Strength        Full None  Special Tests      Pseudoparalytic No No  Neurologic      Fires PIN, radial, median, ulnar, musculocutaneous, axillary, suprascapular, long thoracic, and spinal accessory innervated muscles. No abnormal sensibility  Vascular/Lymphatic      Radial Pulse 2+ 2+  Cervical Exam      Patient has symmetric cervical range of motion with negative Spurling's test.  Special Test: 2+ anterior posterior load-and-shift          Imaging:   Xray (3 views right shoulder): Normal   MRI right shoulder: Inferior and posterior labral tearing   I personally reviewed and interpreted the radiographs.     Assessment and Plan:   33 y.o. female right-hand-dominant female with right shoulder pain in the setting of ligamentous laxity.  I did discuss that with her history of hypermobility she likely does suffer from some component of multidirectional instability although this really not intentional.  She has subluxation episodes despite strengthening of the shoulder.  Overall I do believe that this is likely result of laxity in the glenohumeral joint.  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  This time given the fact that she is status post  previous superior labral tear with recurrent pain and inability to perform overhead motion we did discuss the possibility of revision surgery.  I did ultimately discuss that I do believe she would fit from shoulder arthroscopy with inferior labral pair.  I discussed the risks and limitations.  After discussion of this she is elected to proceed   -Plan for right shoulder arthroscopy with inferior labral repair     After a lengthy discussion of treatment options, including risks, benefits, alternatives, complications of surgical and nonsurgical conservative options, the patient elected surgical repair.    The patient  is aware of the material risks  and complications including, but not limited to injury to adjacent structures, neurovascular injury, infection, numbness, bleeding, implant failure, thermal burns, stiffness, persistent pain, failure to heal, disease transmission from allograft, need for further surgery, dislocation, anesthetic risks, blood clots, risks of death,and others. The probabilities of surgical success and failure discussed with patient given their particular co-morbidities.The time and nature of expected rehabilitation and recovery was discussed.The patient's questions were all answered preoperatively.  No barriers to understanding were  noted. I explained the natural history of the disease process and Rx rationale.  I explained to the patient what I considered to be reasonable expectations given their personal situation.  The final treatment plan was arrived at through a shared patient decision making process model.       I personally saw and evaluated the patient, and participated in the management and treatment plan.   Wilhelmenia Harada, MD Attending Physician, Orthopedic Surgery   This document was dictated using Dragon voice recognition software. A reasonable attempt at proof reading has been made to minimize errors

## 2023-07-30 NOTE — Progress Notes (Signed)
Assisted Dr. Edmond Fitzgerald with right, interscalene , ultrasound guided block. Side rails up, monitors on throughout procedure. See vital signs in flow sheet. Tolerated Procedure well. ?

## 2023-07-30 NOTE — Anesthesia Preprocedure Evaluation (Addendum)
 Anesthesia Evaluation  Patient identified by MRN, date of birth, ID band Patient awake    Reviewed: Allergy & Precautions, H&P , NPO status , Patient's Chart, lab work & pertinent test results  Airway Mallampati: I  TM Distance: >3 FB Neck ROM: Full    Dental no notable dental hx. (+) Teeth Intact, Dental Advisory Given   Pulmonary asthma    Pulmonary exam normal breath sounds clear to auscultation       Cardiovascular negative cardio ROS  Rhythm:Regular Rate:Normal     Neuro/Psych  Headaches  Anxiety Depression       GI/Hepatic negative GI ROS, Neg liver ROS,,,  Endo/Other  negative endocrine ROS    Renal/GU negative Renal ROS  negative genitourinary   Musculoskeletal  (+) Arthritis ,    Abdominal   Peds  Hematology negative hematology ROS (+)   Anesthesia Other Findings   Reproductive/Obstetrics negative OB ROS                             Anesthesia Physical Anesthesia Plan  ASA: 2  Anesthesia Plan: General   Post-op Pain Management: Regional block* and Tylenol  PO (pre-op)*   Induction: Intravenous  PONV Risk Score and Plan: 4 or greater and Ondansetron , Dexamethasone , Midazolam, Propofol infusion, TIVA and Scopolamine  patch - Pre-op  Airway Management Planned: Oral ETT  Additional Equipment:   Intra-op Plan:   Post-operative Plan: Extubation in OR  Informed Consent: I have reviewed the patients History and Physical, chart, labs and discussed the procedure including the risks, benefits and alternatives for the proposed anesthesia with the patient or authorized representative who has indicated his/her understanding and acceptance.     Dental advisory given  Plan Discussed with: CRNA  Anesthesia Plan Comments:        Anesthesia Quick Evaluation

## 2023-07-30 NOTE — Anesthesia Postprocedure Evaluation (Signed)
 Anesthesia Post Note  Patient: Pamela Norton  Procedure(s) Performed: ARTHROSCOPY, SHOULDER, WITH GLENOID LABRUM REPAIR (Right: Shoulder)     Patient location during evaluation: PACU Anesthesia Type: General and Regional Level of consciousness: awake and alert Pain management: pain level controlled Vital Signs Assessment: post-procedure vital signs reviewed and stable Respiratory status: spontaneous breathing, nonlabored ventilation and respiratory function stable Cardiovascular status: blood pressure returned to baseline and stable Postop Assessment: no apparent nausea or vomiting Anesthetic complications: no  No notable events documented.  Last Vitals:  Vitals:   07/30/23 1115 07/30/23 1154  BP: 123/83 115/86  Pulse: 98 92  Resp: 18 20  Temp:    SpO2: 95% 96%    Last Pain:  Vitals:   07/30/23 1154  TempSrc:   PainSc: 0-No pain                 Astin Sayre,W. EDMOND

## 2023-07-30 NOTE — Anesthesia Procedure Notes (Signed)
 Anesthesia Regional Block: Interscalene brachial plexus block   Pre-Anesthetic Checklist: , timeout performed,  Correct Patient, Correct Site, Correct Laterality,  Correct Procedure, Correct Position, site marked,  Risks and benefits discussed,  Pre-op evaluation,  At surgeon's request and post-op pain management  Laterality: Right  Prep: Maximum Sterile Barrier Precautions used, chloraprep       Needles:  Injection technique: Single-shot  Needle Type: Echogenic Stimulator Needle     Needle Length: 5cm  Needle Gauge: 22     Additional Needles:   Procedures:,,,, ultrasound used (permanent image in chart),,    Narrative:  Start time: 07/30/2023 8:16 AM End time: 07/30/2023 8:26 AM Injection made incrementally with aspirations every 5 mL.  Performed by: Personally  Anesthesiologist: Jake Mayers, MD  Additional Notes:

## 2023-07-30 NOTE — Op Note (Signed)
 Date of Surgery: 07/30/2023  INDICATIONS: [name] is a 33 y.o.-year-old female with right shoulder labral tear.  The risk and benefits of the procedure were discussed in detail and documented in the pre-operative evaluation.   PREOPERATIVE DIAGNOSIS: 1. Right shoulder labral tear  POSTOPERATIVE DIAGNOSIS: Same.  PROCEDURE: 1. Right shoulder inferior labral repair  SURGEON: Carmina Chris MD  ASSISTANT: Deon Flatter, ATC  ANESTHESIA:  general  IV FLUIDS AND URINE: See anesthesia record.  ANTIBIOTICS: Ancef  ESTIMATED BLOOD LOSS: 5 mL.  IMPLANTS:  Implant Name Type Inv. Item Serial No. Manufacturer Lot No. LRB No. Used Action  ANCHOR SUT 1.8 FIBERTAK SB KL - [id number] Anchor ANCHOR SUT 1.8 FIBERTAK SB KL  [employer] [id number] Right 1 Implanted  ANCHOR SUT 1.8 FIBERTAK SB KL - [id number] Anchor ANCHOR SUT 1.8 FIBERTAK SB KL  [employer] [id number] Right 1 Implanted  ANCHOR SUT 1.8 FIBERTAK SB KL - [id number] Anchor ANCHOR SUT 1.8 FIBERTAK SB KL  [employer] [id number] Right 1 Implanted  ANCHOR SUT 1.8 FIBERTAK SB KL - [id number] Anchor ANCHOR SUT 1.8 FIBERTAK SB KL  [employer] [id number] Right 1 Implanted    DRAINS: None  CULTURES: None  COMPLICATIONS: none  DESCRIPTION OF PROCEDURE:   Examination under anesthesia revealed forward elevation of 165 degrees.  In abduction, there was 90 degrees of external rotation and 70 degrees of internal rotation.  With the arm at the side, there was 70 degrees of external rotation.  There is a 1+ anterior load shift and a 1+ posterior load shift with palpable click as the humerus moved over the glenoid.  Arthroscopic findings demonstrated:  Glenoid cartilage: Normal Humeral head: Normal Labrum:  labral tear from 3 o'clock to 9 o'clock Biceps insertion: Intact Biceps tendon: Intact Subscapularis insertion: Normal Rotator cuff: Normal  The patient was identified in the preoperative holding area.  The correct site was marked according  to universal protocol.  Anesthesia performed an interscalene nerve block.  Ancef was given 1 hour prior to skin incision.  The patient was subsequently taken back to the operating room.  The patient was prepped and draped and positioned in the lateral position.  All bony prominences were padded.  Final timeout was performed.  Standard posterior, anterior and anterosuperolateral portals were utilized. The posterior portal was created with an 11-blade and the arthroscope introduced into the glenohumeral joint.  A full diagnostic arthroscopy was performed as described above.  A low anterior portal just above the rolled border of the subscapularis was identified with a spinal needle, and then instrumenting cannula was placed.  An anterosuperolateral viewing portal was localized with a spinal needle just posterior to the biceps tendon, and the arthroscope was transferred to this portal.  A posterior portal was also placed for instrumentation.  First, I directed my attention to preparation of the glenoid, labrum and capsule for repair.  The elevator was used to elevate off the injured labrum from the glenoid rim.  A shaver was subsequently introduced and used on forward in order to create bleeding bony bed for the labrum to heal back to.  Debridement was performed of the posterior and inferior labrum with combination of electrocautery and shaver.  Next, I sequentially repaired the capsule and labrum from the 3:00 position to the 9:00 position with a total of 3 anchors.  These were all suture knotless anchors as noted above.  Anchors were placed at the 3:30, 5:30, 6:30, 8:00 positions.  At each location, a pilot  hole was drilled, the anchor inserted and deployed.  Then, one limb of suture was shuttled around the labrum and capsule using a suture lasso, taking care to provide both medial to lateral and inferior to superior shift of the tissues.  This was subsequently fed into the knotless mechanism and tensioned.  This  was done sequentially for all additional anchors.  Once completed, the labrum was restored to an anatomic position, and tension was restored to both bands of the IGHL.  The inferior capsular volume was normalized and the humeral head was centered on the glenoid.   All instruments were removed, fluid was evacuated, and the arthroscopy portals were closed with 3-0 nylon.  A sterile dressing was applied with Xeroform, gauze, ABD and Medipore tape followed by a Iceman device and a sling with an abduction pillow.    The patient awoke from anesthesia without difficulty and was transferred to PACU in stable condition.     POSTOPERATIVE PLAN: She will follow the labral repair protocol.  She replaced on aspirin  for blood clot prevention for 2 weeks.  She will be seen back in 2 weeks for suture removal  Carmina Chris, MD 10:34 AM

## 2023-07-31 ENCOUNTER — Encounter (HOSPITAL_BASED_OUTPATIENT_CLINIC_OR_DEPARTMENT_OTHER): Payer: Self-pay | Admitting: Orthopaedic Surgery

## 2023-08-01 ENCOUNTER — Encounter (HOSPITAL_BASED_OUTPATIENT_CLINIC_OR_DEPARTMENT_OTHER): Payer: Self-pay | Admitting: Orthopaedic Surgery

## 2023-08-02 DIAGNOSIS — M25411 Effusion, right shoulder: Secondary | ICD-10-CM | POA: Diagnosis not present

## 2023-08-02 DIAGNOSIS — M25511 Pain in right shoulder: Secondary | ICD-10-CM | POA: Diagnosis not present

## 2023-08-02 DIAGNOSIS — Z4789 Encounter for other orthopedic aftercare: Secondary | ICD-10-CM | POA: Diagnosis not present

## 2023-08-02 DIAGNOSIS — M25611 Stiffness of right shoulder, not elsewhere classified: Secondary | ICD-10-CM | POA: Diagnosis not present

## 2023-08-03 ENCOUNTER — Ambulatory Visit (HOSPITAL_BASED_OUTPATIENT_CLINIC_OR_DEPARTMENT_OTHER): Admitting: Physical Therapy

## 2023-08-05 ENCOUNTER — Encounter (HOSPITAL_BASED_OUTPATIENT_CLINIC_OR_DEPARTMENT_OTHER): Admitting: Orthopaedic Surgery

## 2023-08-05 ENCOUNTER — Encounter (HOSPITAL_BASED_OUTPATIENT_CLINIC_OR_DEPARTMENT_OTHER): Payer: Self-pay | Admitting: Orthopaedic Surgery

## 2023-08-07 ENCOUNTER — Encounter (HOSPITAL_BASED_OUTPATIENT_CLINIC_OR_DEPARTMENT_OTHER): Payer: Self-pay | Admitting: Orthopaedic Surgery

## 2023-08-07 ENCOUNTER — Encounter (HOSPITAL_BASED_OUTPATIENT_CLINIC_OR_DEPARTMENT_OTHER): Admitting: Orthopaedic Surgery

## 2023-08-07 ENCOUNTER — Telehealth: Payer: Self-pay | Admitting: Orthopaedic Surgery

## 2023-08-07 DIAGNOSIS — M25511 Pain in right shoulder: Secondary | ICD-10-CM | POA: Diagnosis not present

## 2023-08-07 DIAGNOSIS — M25611 Stiffness of right shoulder, not elsewhere classified: Secondary | ICD-10-CM | POA: Diagnosis not present

## 2023-08-07 DIAGNOSIS — M25411 Effusion, right shoulder: Secondary | ICD-10-CM | POA: Diagnosis not present

## 2023-08-07 DIAGNOSIS — Z4789 Encounter for other orthopedic aftercare: Secondary | ICD-10-CM | POA: Diagnosis not present

## 2023-08-07 NOTE — Telephone Encounter (Signed)
 Patient is s/p right shoulder inferior labral tear surgery 07/30/23.  requesting OOW from 07/30/23 x 4 weeks  then RTW 08/29/23 remote work only for 4 weeks. Thank you! Patient will discuss further at postop appt 5/23. Thank you!

## 2023-08-09 ENCOUNTER — Encounter (HOSPITAL_BASED_OUTPATIENT_CLINIC_OR_DEPARTMENT_OTHER): Admitting: Physical Therapy

## 2023-08-09 ENCOUNTER — Ambulatory Visit (INDEPENDENT_AMBULATORY_CARE_PROVIDER_SITE_OTHER): Admitting: Orthopaedic Surgery

## 2023-08-09 DIAGNOSIS — J301 Allergic rhinitis due to pollen: Secondary | ICD-10-CM | POA: Diagnosis not present

## 2023-08-09 DIAGNOSIS — J3089 Other allergic rhinitis: Secondary | ICD-10-CM | POA: Diagnosis not present

## 2023-08-09 DIAGNOSIS — J3081 Allergic rhinitis due to animal (cat) (dog) hair and dander: Secondary | ICD-10-CM | POA: Diagnosis not present

## 2023-08-09 DIAGNOSIS — M25311 Other instability, right shoulder: Secondary | ICD-10-CM

## 2023-08-09 NOTE — Progress Notes (Signed)
 Post Operative Evaluation    Procedure/Date of Surgery: Right shoulder arthroscopy with capsulorrhaphy labral repair 5/13  Interval History:   Presents today for follow up of her right shoulder.  Overall she is doing extremely well.  She has been working on range of motion with physical therapy.  She is having some symptoms on the contralateral side as well   PMH/PSH/Family History/Social History/Meds/Allergies:    Past Medical History:  Diagnosis Date   Anxiety    Asthma    Celiac disease    Complication of anesthesia    Depression    Family history of breast cancer    Family history of colon cancer    Family history of colonic polyps    Family history of melanoma    Family history of thyroid  cancer    Migraines    Past Surgical History:  Procedure Laterality Date   SHOULDER ARTHROSCOPY WITH LABRAL REPAIR Right 07/30/2023   Procedure: ARTHROSCOPY, SHOULDER, WITH GLENOID LABRUM REPAIR;  Surgeon: Wilhelmenia Harada, MD;  Location: [facility];  Service: Orthopedics;  Laterality: Right;  RIGHT SHOULDER ARTHROSCOPY WITH INFERIOR LABRAL REPAIR   WISDOM TOOTH EXTRACTION     WRIST SURGERY Left    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    Social History   Socioeconomic History   Marital status: Single    Spouse name: Not on file   Number of children: Not on file   Years of education: Not on file   Highest education level: Not on file  Occupational History   Not on file  Tobacco Use   Smoking status: Never   Smokeless tobacco: Never  Vaping Use   Vaping status: Never Used  Substance and Sexual Activity   Alcohol use: Yes    Alcohol/week: 1.0 standard drink of alcohol    Types: 1 Cans of beer per week   Drug use: No   Sexual activity: Never  Other Topics Concern   Not on file  Social History Narrative   Not on file   Social Drivers of Health   Financial Resource Strain: Not on file  Food Insecurity: Not on  file  Transportation Needs: Not on file  Physical Activity: Not on file  Stress: Not on file  Social Connections: Not on file   Family History  Problem Relation Age of Onset   Thyroid  disease Mother    Hypothyroidism Mother    Osteoarthritis Mother    Fibromyalgia Mother    Restless legs syndrome Mother    Thyroid  cancer Mother 26       Papillary   Melanoma Mother 73   Colon polyps Mother        cscope every 3 years   Migraines Mother    Colon polyps Father    Colon cancer Maternal Grandmother 27   Kidney cancer Maternal Grandfather 35   Dementia Paternal Grandmother    Throat cancer Paternal Grandfather    Melanoma Paternal Grandfather    Breast cancer Other    Allergies  Allergen Reactions   Betadine [Povidone-Iodine] Hives   Clarithromycin Anxiety, Nausea Only and Shortness Of Breath   Amphetamine-Dextroamphetamine     Other reaction(s): nausea   Cefaclor     REACTION: Hives Other reaction(s): hives   Food     Multiple Food Allergies:  bell Peppers, Garlic, Shellfish, Pine Nuts, Peanuts, Soy, Melon, Watermelon, Honeydew, Cantaloupe, Squash, Pumpkin, Gourd, Zucchini   Meloxicam     Other reaction(s): migraine and hives   Peanuts [Peanut Oil]    Prednisone     Other reaction(s): tachycardia   Soy Allergy (Obsolete)    Coconut (Cocos Nucifera) Dermatitis, Hives and Rash   Latex Hives and Rash   Current Outpatient Medications  Medication Sig Dispense Refill   ondansetron  (ZOFRAN -ODT) 8 MG disintegrating tablet Take 1 tablet (8 mg total) by mouth every 8 (eight) hours as needed for nausea or vomiting. 15 tablet 0   albuterol  (ACCUNEB ) 0.63 MG/3ML nebulizer solution Take 1 ampule by nebulization every 6 (six) hours as needed for Wheezing.     aspirin  EC 325 MG tablet Take 1 tablet (325 mg total) by mouth daily. 14 tablet 0   azelastine (ASTELIN) 0.1 % nasal spray Place into both nostrils.     Baclofen 20 MG/20ML SOLN 2 (two) times daily as needed.      budesonide-formoterol (SYMBICORT) 160-4.5 MCG/ACT inhaler USE 2 PUFFS TWICE A DAY     celecoxib  (CELEBREX ) 100 MG capsule Take 1 capsule (100 mg total) by mouth 2 (two) times daily. 60 capsule 2   EPINEPHrine  0.3 mg/0.3 mL IJ SOAJ injection See admin instructions.  0   fexofenadine (ALLEGRA) 180 MG tablet Take 180 mg by mouth in the morning and at bedtime.     FLUoxetine (PROZAC) 20 MG capsule Take 20 mg by mouth every morning.  2   Galcanezumab -gnlm (EMGALITY ) 120 MG/ML SOAJ INJECT 1 PEN INTO THE SKIN EVERY 30 DAYS 1 mL 6   HYDROmorphone  (DILAUDID ) 2 MG tablet Take 1 tablet (2 mg total) by mouth every 4 (four) hours as needed for severe pain (pain score 7-10). 30 tablet 0   HYDROmorphone  (DILAUDID ) 2 MG tablet Take 1 tablet (2 mg total) by mouth every 4 (four) hours as needed for severe pain (pain score 7-10). 20 tablet 0   montelukast (SINGULAIR) 10 MG tablet Take 10 mg by mouth at bedtime.     Multiple Vitamin (MULTIVITAMIN) capsule Take 1 capsule by mouth daily.     omeprazole (PRILOSEC) 20 MG capsule Take 20 mg by mouth daily.     scopolamine  (TRANSDERM-SCOP) 1 MG/3DAYS Place 1 patch (1.5 mg total) onto the skin every 3 (three) days. 10 patch 0   SPIRIVA RESPIMAT 1.25 MCG/ACT AERS SMARTSIG:2 Puff(s) Via Inhaler Daily     triamcinolone  (NASACORT ) 55 MCG/ACT AERO nasal inhaler Place 2 sprays into the nose daily.     UBRELVY  100 MG TABS Take 100 mg by mouth if needed as close to onset of migraine as possible. May repeat in 2 hours if headache persists or returns. Do not exceed 2 tablets in 24 hours. 10 tablet 11   No current facility-administered medications for this visit.   No results found.  Review of Systems:   A ROS was performed including pertinent positives and negatives as documented in the HPI.   Musculoskeletal Exam:      Shoulder with range of motion in the spine position to 90 degrees with external rotation at the side to 30 degrees.  Internal rotation deferred  today  Imaging:      I personally reviewed and interpreted the radiographs.   Assessment:   2 weeks status post right shoulder capsulorrhaphy doing extremely well.  At this time I would like her to continue to work on range of motion and I will  plan to see her back in 4 weeks for reassessment  Plan :    - Return to clinic for 4 weeks for reassessment      I personally saw and evaluated the patient, and participated in the management and treatment plan.  Wilhelmenia Harada, MD Attending Physician, Orthopedic Surgery  This document was dictated using Dragon voice recognition software. A reasonable attempt at proof reading has been made to minimize errors.

## 2023-08-12 ENCOUNTER — Encounter (HOSPITAL_BASED_OUTPATIENT_CLINIC_OR_DEPARTMENT_OTHER): Payer: Self-pay | Admitting: Orthopaedic Surgery

## 2023-08-14 DIAGNOSIS — M25611 Stiffness of right shoulder, not elsewhere classified: Secondary | ICD-10-CM | POA: Diagnosis not present

## 2023-08-14 DIAGNOSIS — M25511 Pain in right shoulder: Secondary | ICD-10-CM | POA: Diagnosis not present

## 2023-08-14 DIAGNOSIS — Z4789 Encounter for other orthopedic aftercare: Secondary | ICD-10-CM | POA: Diagnosis not present

## 2023-08-14 DIAGNOSIS — M25411 Effusion, right shoulder: Secondary | ICD-10-CM | POA: Diagnosis not present

## 2023-08-16 DIAGNOSIS — J301 Allergic rhinitis due to pollen: Secondary | ICD-10-CM | POA: Diagnosis not present

## 2023-08-16 DIAGNOSIS — J3089 Other allergic rhinitis: Secondary | ICD-10-CM | POA: Diagnosis not present

## 2023-08-16 DIAGNOSIS — J3081 Allergic rhinitis due to animal (cat) (dog) hair and dander: Secondary | ICD-10-CM | POA: Diagnosis not present

## 2023-08-17 ENCOUNTER — Encounter (HOSPITAL_BASED_OUTPATIENT_CLINIC_OR_DEPARTMENT_OTHER): Admitting: Physical Therapy

## 2023-08-20 NOTE — Progress Notes (Unsigned)
 Patient: Pamela Norton Date of Birth: [birth year]  Reason for Visit: Follow up History from: Patient Primary Neurologist: Tresia Fruit   ASSESSMENT AND PLAN 33 y.o. year old female   1.  Chronic migraine headache  -Doing much better, has had very good improvement -Continue Emgality  120 mg monthly injection for migraine prevention -Continue Ubrelvy  100 mg as needed for acute headache treatment -Call for worsening headache, follow-up in 1 year or sooner if needed -Next steps: Qulipta or Botox  HISTORY OF PRESENT ILLNESS: Today 08/20/23   Update 07/10/22 SS: At last visit in May 2023 Dr. Tresia Fruit started Topamax  had drowsiness, slow cognition.  Started Ajovy  in May 2023.  Did 3 months, then switched to Emgality  for insurance reasons. Doing well on Emgality , few and less intense migraines. 2-3 migraines a month now, was having 8 or more a month. Has been noticing some insomnia after taking the Emgality  at night, yesterday she tried it in the morning. Recently we had to stop the Nurtec due to insurance, ordered Ubrelvy . 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   HISTORY  07/19/21 Dr. Tresia Fruit: HPI:  Pamela Norton is a 33 y.o. female here as requested by Pete Brand, DO for migraines. PMHx migraine headaches, asthma, celiac disease, depression, idiopathic hypersomnia.  I reviewed Dr. Hazeline Lister notes, she was initially referred to [facility] health and headache sleep center per patient's request for worsening migraine headaches, but she is here at [facility] neurologic for migraines.  She also has anxiety.  Hemoglobin A1c in June 2022 was 5.6, thyroid  was 5.01 which was elevated but free T4 was normal, LDL was 131 which was elevated, CBC was normal, CMP was unremarkable with BUN 9 and creatinine 0.68, physical examination was unremarkable, she has been started on Ubrelvy  in the past for her migraines and given samples, it appears she is also had Relpax on medication list, she has a  history of migraines usually 1/month increased to 3-4, unknown triggers, Relpax not helping as much, described as throbbing sharp pain around the entire head, some nausea, no vision changes or vomiting, positive photophobia.  More recent labs include a normal free T4 all collected in February 2023, normal TSH, negative thyroid  antibody panel.   She has had migraines since HS since 12-15, mom has migraines and maternal father also had migraine. No visual aura but sometimes has some speech changes, usually this happens before the migraine 1-2, not every time but recurs in the exact same manner with multiple migraines had it at least with 5 migraines many more.She has never been able to pinpoint a trigger. It starts as a normal headahe and progresses. She may wake up with one. Pain is behind the eyes, sharp, throbbing/pulsating, +phono/photophobia, nausea, diarrhea, moderate to severe, occ vomiting, movement makes them worse, can last 24 hours. Not posotional but after she exercises she can have a headache ongoing 3-4 years. She did have an MRi in the past and it was negative. She has a lot of food allergies and celiac disease. For the last 2 years she gets 2 a week, 8 migraine days a month and > 15headache days a month (headaches+migraines). Relpax did not work. Ubrelvy  worked for a while and doesn't help. No other focal neurologic deficits, associated symptoms, inciting events or modifiable factors.   Propranolol contraindicated due to asthma, never been on topamax /topiramate , been on multiple depression medications prozac, amitriptyline(side effects could not tolerate), wellbutrin, celexa, lexapro, imitrex, relpax, aimovig contraindicated due to  celiac disease and constipation.  REVIEW OF SYSTEMS: Out of a complete 14 system review of symptoms, the patient complains only of the following symptoms, and all other reviewed systems are negative.  See HPI  ALLERGIES: Allergies  Allergen Reactions   Betadine  [Povidone-Iodine] Hives   Clarithromycin Anxiety, Nausea Only and Shortness Of Breath   Amphetamine-Dextroamphetamine     Other reaction(s): nausea   Cefaclor     REACTION: Hives Other reaction(s): hives   Food     Multiple Food Allergies: bell Peppers, Garlic, Shellfish, Pine Nuts, Peanuts, Soy, Melon, Watermelon, Honeydew, Cantaloupe, Squash, Pumpkin, Gourd, Zucchini   Meloxicam     Other reaction(s): migraine and hives   Peanuts [Peanut Oil]    Prednisone     Other reaction(s): tachycardia   Soy Allergy (Obsolete)    Coconut (Cocos Nucifera) Dermatitis, Hives and Rash   Latex Hives and Rash    HOME MEDICATIONS: Outpatient Medications Prior to Visit  Medication Sig Dispense Refill   ondansetron  (ZOFRAN -ODT) 8 MG disintegrating tablet Take 1 tablet (8 mg total) by mouth every 8 (eight) hours as needed for nausea or vomiting. 15 tablet 0   albuterol  (ACCUNEB ) 0.63 MG/3ML nebulizer solution Take 1 ampule by nebulization every 6 (six) hours as needed for Wheezing.     aspirin  EC 325 MG tablet Take 1 tablet (325 mg total) by mouth daily. 14 tablet 0   azelastine (ASTELIN) 0.1 % nasal spray Place into both nostrils.     Baclofen 20 MG/20ML SOLN 2 (two) times daily as needed.     budesonide-formoterol (SYMBICORT) 160-4.5 MCG/ACT inhaler USE 2 PUFFS TWICE A DAY     celecoxib  (CELEBREX ) 100 MG capsule Take 1 capsule (100 mg total) by mouth 2 (two) times daily. 60 capsule 2   EPINEPHrine  0.3 mg/0.3 mL IJ SOAJ injection See admin instructions.  0   fexofenadine (ALLEGRA) 180 MG tablet Take 180 mg by mouth in the morning and at bedtime.     FLUoxetine (PROZAC) 20 MG capsule Take 20 mg by mouth every morning.  2   Galcanezumab -gnlm (EMGALITY ) 120 MG/ML SOAJ INJECT 1 PEN INTO THE SKIN EVERY 30 DAYS 1 mL 6   HYDROmorphone  (DILAUDID ) 2 MG tablet Take 1 tablet (2 mg total) by mouth every 4 (four) hours as needed for severe pain (pain score 7-10). 30 tablet 0   HYDROmorphone  (DILAUDID ) 2 MG tablet  Take 1 tablet (2 mg total) by mouth every 4 (four) hours as needed for severe pain (pain score 7-10). 20 tablet 0   montelukast (SINGULAIR) 10 MG tablet Take 10 mg by mouth at bedtime.     Multiple Vitamin (MULTIVITAMIN) capsule Take 1 capsule by mouth daily.     omeprazole (PRILOSEC) 20 MG capsule Take 20 mg by mouth daily.     scopolamine  (TRANSDERM-SCOP) 1 MG/3DAYS Place 1 patch (1.5 mg total) onto the skin every 3 (three) days. 10 patch 0   SPIRIVA RESPIMAT 1.25 MCG/ACT AERS SMARTSIG:2 Puff(s) Via Inhaler Daily     triamcinolone  (NASACORT ) 55 MCG/ACT AERO nasal inhaler Place 2 sprays into the nose daily.     UBRELVY  100 MG TABS Take 100 mg by mouth if needed as close to onset of migraine as possible. May repeat in 2 hours if headache persists or returns. Do not exceed 2 tablets in 24 hours. 10 tablet 11   No facility-administered medications prior to visit.    PAST MEDICAL HISTORY: Past Medical History:  Diagnosis Date   Anxiety  Asthma    Celiac disease    Complication of anesthesia    Depression    Family history of breast cancer    Family history of colon cancer    Family history of colonic polyps    Family history of melanoma    Family history of thyroid  cancer    Migraines     PAST SURGICAL HISTORY: Past Surgical History:  Procedure Laterality Date   SHOULDER ARTHROSCOPY WITH LABRAL REPAIR Right 07/30/2023   Procedure: ARTHROSCOPY, SHOULDER, WITH GLENOID LABRUM REPAIR;  Surgeon: Wilhelmenia Harada, MD;  Location: [facility];  Service: Orthopedics;  Laterality: Right;  RIGHT SHOULDER ARTHROSCOPY WITH INFERIOR LABRAL REPAIR   WISDOM TOOTH EXTRACTION     WRIST SURGERY Left    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     FAMILY HISTORY: Family History  Problem Relation Age of Onset   Thyroid  disease Mother    Hypothyroidism Mother    Osteoarthritis Mother    Fibromyalgia Mother    Restless legs syndrome Mother    Thyroid  cancer Mother 41        Papillary   Melanoma Mother 7   Colon polyps Mother        cscope every 3 years   Migraines Mother    Colon polyps Father    Colon cancer Maternal Grandmother 56   Kidney cancer Maternal Grandfather 13   Dementia Paternal Grandmother    Throat cancer Paternal Grandfather    Melanoma Paternal Grandfather    Breast cancer Other     SOCIAL HISTORY: Social History   Socioeconomic History   Marital status: Single    Spouse name: Not on file   Number of children: Not on file   Years of education: Not on file   Highest education level: Not on file  Occupational History   Not on file  Tobacco Use   Smoking status: Never   Smokeless tobacco: Never  Vaping Use   Vaping status: Never Used  Substance and Sexual Activity   Alcohol use: Yes    Alcohol/week: 1.0 standard drink of alcohol    Types: 1 Cans of beer per week   Drug use: No   Sexual activity: Never  Other Topics Concern   Not on file  Social History Narrative   Not on file   Social Drivers of Health   Financial Resource Strain: Not on file  Food Insecurity: Not on file  Transportation Needs: Not on file  Physical Activity: Not on file  Stress: Not on file  Social Connections: Not on file  Intimate Partner Violence: Not on file   PHYSICAL EXAM  There were no vitals filed for this visit.  There is no height or weight on file to calculate BMI.  Generalized: Well developed, in no acute distress  Neurological examination  Mentation: Alert oriented to time, place, history taking. Follows all commands speech and language fluent Cranial nerve II-XII: Pupils were equal round reactive to light. Extraocular movements were full, visual field were full on confrontational test. Facial sensation and strength were normal. Head turning and shoulder shrug  were normal and symmetric. Motor: The motor testing reveals 5 over 5 strength of all 4 extremities. Good symmetric motor tone is noted throughout.  Sensory: Sensory  testing is intact to soft touch on all 4 extremities. No evidence of extinction is noted.  Coordination: Cerebellar testing reveals good finger-nose-finger and heel-to-shin bilaterally.  Gait and station: Gait is normal.  Reflexes: Deep  tendon reflexes are symmetric but increased throughout  DIAGNOSTIC DATA (LABS, IMAGING, TESTING) - I reviewed patient records, labs, notes, testing and imaging myself where available.  Lab Results  Component Value Date   WBC 7.2 02/13/2016   HGB 13.5 02/13/2016   HCT 40.7 02/13/2016   MCV 91.3 02/13/2016   PLT 369 02/13/2016      Component Value Date/Time   NA 141 02/13/2016 1603   K 4.4 02/13/2016 1603   CL 106 02/13/2016 1603   CO2 26 02/13/2016 1603   GLUCOSE 81 02/13/2016 1603   BUN 9 02/13/2016 1603   CREATININE 0.64 02/13/2016 1603   CALCIUM 9.6 02/13/2016 1603   PROT 7.1 02/13/2016 1603   ALBUMIN 4.3 02/13/2016 1603   AST 18 02/13/2016 1603   ALT 13 02/13/2016 1603   ALKPHOS 45 02/13/2016 1603   BILITOT 0.3 02/13/2016 1603   No results found for: "CHOL", "HDL", "LDLCALC", "LDLDIRECT", "TRIG", "CHOLHDL" No results found for: "HGBA1C" No results found for: "VITAMINB12" Lab Results  Component Value Date   TSH 1.98 02/13/2016    Jeanmarie Millet, AGNP-C, DNP 08/20/2023, 9:49 PM [facility] [address], [address] [place], [place] [address] [phone]

## 2023-08-21 ENCOUNTER — Ambulatory Visit: Admitting: Neurology

## 2023-08-21 ENCOUNTER — Telehealth: Payer: Self-pay

## 2023-08-21 ENCOUNTER — Encounter: Payer: Self-pay | Admitting: Neurology

## 2023-08-21 VITALS — BP 117/84 | HR 96 | Wt 138.0 lb

## 2023-08-21 DIAGNOSIS — M25411 Effusion, right shoulder: Secondary | ICD-10-CM | POA: Diagnosis not present

## 2023-08-21 DIAGNOSIS — G43109 Migraine with aura, not intractable, without status migrainosus: Secondary | ICD-10-CM | POA: Diagnosis not present

## 2023-08-21 DIAGNOSIS — M25511 Pain in right shoulder: Secondary | ICD-10-CM | POA: Diagnosis not present

## 2023-08-21 DIAGNOSIS — M25611 Stiffness of right shoulder, not elsewhere classified: Secondary | ICD-10-CM | POA: Diagnosis not present

## 2023-08-21 DIAGNOSIS — Z4789 Encounter for other orthopedic aftercare: Secondary | ICD-10-CM | POA: Diagnosis not present

## 2023-08-21 MED ORDER — UBRELVY 100 MG PO TABS: ORAL_TABLET | ORAL | 11 refills | Status: DC

## 2023-08-21 MED ORDER — QULIPTA 60 MG PO TABS: 60.0000 mg | ORAL_TABLET | Freq: Every day | ORAL | 11 refills | Status: DC

## 2023-08-21 NOTE — Telephone Encounter (Signed)
 Call to patient, left message to see if she could swith appointment today to a VV at 11:15. Asked to return call and mychart message sent

## 2023-08-21 NOTE — Patient Instructions (Signed)
 Stop Emgality , start Qulipta for migraine prevention. Continue Ubrelvy  as needed for migraine.   There is increased risk for stroke in women with migraine with aura and a contraindication for the combined contraceptive pill for use by women who have migraine with aura. The risk for women with migraine without aura is lower. However other risk factors like smoking are far more likely to increase stroke risk than migraine. There is a recommendation for no smoking and for the use of OCPs without estrogen such as progestogen only pills particularly for women with migraine with aura.Aaron Aas People who have migraine headaches with auras may be 3 times more likely to have a stroke caused by a blood clot, compared to migraine patients who don't see auras. Women who take hormone-replacement therapy may be 30 percent more likely to suffer a clot-based stroke than women not taking medication containing estrogen. Other risk factors like smoking and high blood pressure may be  much more important. And stroke is still a rare complication due to migraine aura and is controversial and lower doses may not cause a risk.

## 2023-08-23 DIAGNOSIS — J3089 Other allergic rhinitis: Secondary | ICD-10-CM | POA: Diagnosis not present

## 2023-08-23 DIAGNOSIS — J3081 Allergic rhinitis due to animal (cat) (dog) hair and dander: Secondary | ICD-10-CM | POA: Diagnosis not present

## 2023-08-23 DIAGNOSIS — J301 Allergic rhinitis due to pollen: Secondary | ICD-10-CM | POA: Diagnosis not present

## 2023-08-24 ENCOUNTER — Encounter (HOSPITAL_BASED_OUTPATIENT_CLINIC_OR_DEPARTMENT_OTHER): Admitting: Physical Therapy

## 2023-08-26 ENCOUNTER — Encounter (HOSPITAL_BASED_OUTPATIENT_CLINIC_OR_DEPARTMENT_OTHER): Admitting: Physical Therapy

## 2023-08-27 DIAGNOSIS — M25611 Stiffness of right shoulder, not elsewhere classified: Secondary | ICD-10-CM | POA: Diagnosis not present

## 2023-08-27 DIAGNOSIS — M25411 Effusion, right shoulder: Secondary | ICD-10-CM | POA: Diagnosis not present

## 2023-08-27 DIAGNOSIS — M25511 Pain in right shoulder: Secondary | ICD-10-CM | POA: Diagnosis not present

## 2023-08-27 DIAGNOSIS — Z4789 Encounter for other orthopedic aftercare: Secondary | ICD-10-CM | POA: Diagnosis not present

## 2023-08-29 ENCOUNTER — Encounter (HOSPITAL_BASED_OUTPATIENT_CLINIC_OR_DEPARTMENT_OTHER): Admitting: Physical Therapy

## 2023-08-29 DIAGNOSIS — M25511 Pain in right shoulder: Secondary | ICD-10-CM | POA: Diagnosis not present

## 2023-08-29 DIAGNOSIS — Z4789 Encounter for other orthopedic aftercare: Secondary | ICD-10-CM | POA: Diagnosis not present

## 2023-08-29 DIAGNOSIS — M25411 Effusion, right shoulder: Secondary | ICD-10-CM | POA: Diagnosis not present

## 2023-08-29 DIAGNOSIS — M25611 Stiffness of right shoulder, not elsewhere classified: Secondary | ICD-10-CM | POA: Diagnosis not present

## 2023-08-30 DIAGNOSIS — J3081 Allergic rhinitis due to animal (cat) (dog) hair and dander: Secondary | ICD-10-CM | POA: Diagnosis not present

## 2023-08-30 DIAGNOSIS — J3089 Other allergic rhinitis: Secondary | ICD-10-CM | POA: Diagnosis not present

## 2023-08-30 DIAGNOSIS — J301 Allergic rhinitis due to pollen: Secondary | ICD-10-CM | POA: Diagnosis not present

## 2023-09-02 ENCOUNTER — Encounter (HOSPITAL_BASED_OUTPATIENT_CLINIC_OR_DEPARTMENT_OTHER): Admitting: Physical Therapy

## 2023-09-03 DIAGNOSIS — M25611 Stiffness of right shoulder, not elsewhere classified: Secondary | ICD-10-CM | POA: Diagnosis not present

## 2023-09-03 DIAGNOSIS — M25511 Pain in right shoulder: Secondary | ICD-10-CM | POA: Diagnosis not present

## 2023-09-03 DIAGNOSIS — Z4789 Encounter for other orthopedic aftercare: Secondary | ICD-10-CM | POA: Diagnosis not present

## 2023-09-03 DIAGNOSIS — M25411 Effusion, right shoulder: Secondary | ICD-10-CM | POA: Diagnosis not present

## 2023-09-05 ENCOUNTER — Encounter: Payer: Self-pay | Admitting: Neurology

## 2023-09-05 ENCOUNTER — Encounter (HOSPITAL_BASED_OUTPATIENT_CLINIC_OR_DEPARTMENT_OTHER): Admitting: Physical Therapy

## 2023-09-05 DIAGNOSIS — Z4789 Encounter for other orthopedic aftercare: Secondary | ICD-10-CM | POA: Diagnosis not present

## 2023-09-05 DIAGNOSIS — M25411 Effusion, right shoulder: Secondary | ICD-10-CM | POA: Diagnosis not present

## 2023-09-05 DIAGNOSIS — M25511 Pain in right shoulder: Secondary | ICD-10-CM | POA: Diagnosis not present

## 2023-09-05 DIAGNOSIS — M25611 Stiffness of right shoulder, not elsewhere classified: Secondary | ICD-10-CM | POA: Diagnosis not present

## 2023-09-06 ENCOUNTER — Encounter (HOSPITAL_BASED_OUTPATIENT_CLINIC_OR_DEPARTMENT_OTHER): Payer: Self-pay | Admitting: Orthopaedic Surgery

## 2023-09-06 ENCOUNTER — Encounter (HOSPITAL_BASED_OUTPATIENT_CLINIC_OR_DEPARTMENT_OTHER): Payer: Self-pay | Admitting: Internal Medicine

## 2023-09-06 ENCOUNTER — Ambulatory Visit (HOSPITAL_BASED_OUTPATIENT_CLINIC_OR_DEPARTMENT_OTHER): Admitting: Orthopaedic Surgery

## 2023-09-06 DIAGNOSIS — J3089 Other allergic rhinitis: Secondary | ICD-10-CM | POA: Diagnosis not present

## 2023-09-06 DIAGNOSIS — M25362 Other instability, left knee: Secondary | ICD-10-CM

## 2023-09-06 DIAGNOSIS — M25361 Other instability, right knee: Secondary | ICD-10-CM

## 2023-09-06 DIAGNOSIS — J3081 Allergic rhinitis due to animal (cat) (dog) hair and dander: Secondary | ICD-10-CM | POA: Diagnosis not present

## 2023-09-06 DIAGNOSIS — J301 Allergic rhinitis due to pollen: Secondary | ICD-10-CM | POA: Diagnosis not present

## 2023-09-06 NOTE — Progress Notes (Signed)
 Post Operative Evaluation    Procedure/Date of Surgery: Right shoulder arthroscopy with capsulorrhaphy labral repair 5/13  Interval History:   Presents today for follow up of her right shoulder.  Overall she is still having some stiffness and tightness in the setting of previous capsulorrhaphy.  She is also complaining of bilateral knee pain.  She does have a history of multiple subluxations of both knees and has trialed physical therapy and strengthening in the past.  Despite this she experiences crepitus and subluxations of the knees where she needs to push these back in almost daily  PMH/PSH/Family History/Social History/Meds/Allergies:    Past Medical History:  Diagnosis Date   Anxiety    Asthma    Celiac disease    Complication of anesthesia    Depression    Family history of breast cancer    Family history of colon cancer    Family history of colonic polyps    Family history of melanoma    Family history of thyroid  cancer    Migraines    Past Surgical History:  Procedure Laterality Date   SHOULDER ARTHROSCOPY WITH LABRAL REPAIR Right 07/30/2023   Procedure: ARTHROSCOPY, SHOULDER, WITH GLENOID LABRUM REPAIR;  Surgeon: Wilhelmenia Harada, MD;  Location: [facility];  Service: Orthopedics;  Laterality: Right;  RIGHT SHOULDER ARTHROSCOPY WITH INFERIOR LABRAL REPAIR   WISDOM TOOTH EXTRACTION     WRIST SURGERY Left    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    Social History   Socioeconomic History   Marital status: Single    Spouse name: Not on file   Number of children: Not on file   Years of education: Not on file   Highest education level: Not on file  Occupational History   Not on file  Tobacco Use   Smoking status: Never   Smokeless tobacco: Never  Vaping Use   Vaping status: Never Used  Substance and Sexual Activity   Alcohol use: Yes    Alcohol/week: 1.0 standard drink of alcohol    Types: 1 Cans of beer  per week   Drug use: No   Sexual activity: Never  Other Topics Concern   Not on file  Social History Narrative   Not on file   Social Drivers of Health   Financial Resource Strain: Not on file  Food Insecurity: Not on file  Transportation Needs: Not on file  Physical Activity: Not on file  Stress: Not on file  Social Connections: Not on file   Family History  Problem Relation Age of Onset   Thyroid  disease Mother    Hypothyroidism Mother    Osteoarthritis Mother    Fibromyalgia Mother    Restless legs syndrome Mother    Thyroid  cancer Mother 9       Papillary   Melanoma Mother 57   Colon polyps Mother        cscope every 3 years   Migraines Mother    Colon polyps Father    Colon cancer Maternal Grandmother 71   Kidney cancer Maternal Grandfather 48   Dementia Paternal Grandmother    Throat cancer Paternal Grandfather    Melanoma Paternal Grandfather    Breast cancer Other    Allergies  Allergen Reactions   Betadine [Povidone-Iodine] Hives   Clarithromycin Anxiety, Nausea Only  and Shortness Of Breath   Amphetamine-Dextroamphetamine     Other reaction(s): nausea   Cefaclor     REACTION: Hives Other reaction(s): hives   Food     Multiple Food Allergies: bell Peppers, Garlic, Shellfish, Pine Nuts, Peanuts, Soy, Melon, Watermelon, Honeydew, Cantaloupe, Squash, Pumpkin, Gourd, Zucchini   Meloxicam     Other reaction(s): migraine and hives   Peanuts [Peanut Oil]    Prednisone     Other reaction(s): tachycardia   Soy Allergy (Obsolete)    Coconut (Cocos Nucifera) Dermatitis, Hives and Rash   Latex Hives and Rash   Current Outpatient Medications  Medication Sig Dispense Refill   ondansetron  (ZOFRAN -ODT) 8 MG disintegrating tablet Take 1 tablet (8 mg total) by mouth every 8 (eight) hours as needed for nausea or vomiting. (Patient not taking: Reported on 08/21/2023) 15 tablet 0   albuterol  (ACCUNEB ) 0.63 MG/3ML nebulizer solution Take 1 ampule by nebulization every 6  (six) hours as needed for Wheezing.     aspirin  EC 325 MG tablet Take 1 tablet (325 mg total) by mouth daily. 14 tablet 0   Atogepant  (QULIPTA ) 60 MG TABS Take 1 tablet (60 mg total) by mouth daily. 30 tablet 11   azelastine (ASTELIN) 0.1 % nasal spray Place into both nostrils.     Baclofen 20 MG/20ML SOLN 2 (two) times daily as needed.     budesonide-formoterol (SYMBICORT) 160-4.5 MCG/ACT inhaler USE 2 PUFFS TWICE A DAY     celecoxib  (CELEBREX ) 100 MG capsule Take 1 capsule (100 mg total) by mouth 2 (two) times daily. 60 capsule 2   EPINEPHrine  0.3 mg/0.3 mL IJ SOAJ injection See admin instructions.  0   fexofenadine (ALLEGRA) 180 MG tablet Take 180 mg by mouth in the morning and at bedtime.     FLUoxetine (PROZAC) 20 MG capsule Take 20 mg by mouth every morning.  2   HYDROmorphone  (DILAUDID ) 2 MG tablet Take 1 tablet (2 mg total) by mouth every 4 (four) hours as needed for severe pain (pain score 7-10). (Patient not taking: Reported on 08/21/2023) 30 tablet 0   HYDROmorphone  (DILAUDID ) 2 MG tablet Take 1 tablet (2 mg total) by mouth every 4 (four) hours as needed for severe pain (pain score 7-10). (Patient not taking: Reported on 08/21/2023) 20 tablet 0   montelukast (SINGULAIR) 10 MG tablet Take 10 mg by mouth at bedtime.     Multiple Vitamin (MULTIVITAMIN) capsule Take 1 capsule by mouth daily.     omeprazole (PRILOSEC) 20 MG capsule Take 20 mg by mouth daily.     scopolamine  (TRANSDERM-SCOP) 1 MG/3DAYS Place 1 patch (1.5 mg total) onto the skin every 3 (three) days. (Patient not taking: Reported on 08/21/2023) 10 patch 0   SPIRIVA RESPIMAT 1.25 MCG/ACT AERS SMARTSIG:2 Puff(s) Via Inhaler Daily     triamcinolone  (NASACORT ) 55 MCG/ACT AERO nasal inhaler Place 2 sprays into the nose daily.     UBRELVY  100 MG TABS Take 100 mg by mouth if needed as close to onset of migraine as possible. May repeat in 2 hours if headache persists or returns. Do not exceed 2 tablets in 24 hours. 10 tablet 11   No  current facility-administered medications for this visit.   No results found.  Review of Systems:   A ROS was performed including pertinent positives and negatives as documented in the HPI.   Musculoskeletal Exam:      Shoulder with range of motion in the spine position to 90 degrees with external  rotation at the side to 30 degrees.  Internal rotation deferred today  Bilateral patellofemoral joint with significant 4+ laxity with the knee in full extension.  Otherwise range of motion is from 0 to 120 degrees.  There is positive patellar crepitus with grind bilaterally  Imaging:      I personally reviewed and interpreted the radiographs.   Assessment:   6 weeks status post right shoulder capsulorrhaphy doing extremely well.  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  Given this I would like to obtain an MRI of both knees that she has has physical therapy and strengthening in the past without any relief.  At this time we will plan to proceed with bilateral knee MRIs and I will see her back to discuss results  Plan :    - Plan for bilateral knee MRIs and follow-up to discuss results      I personally saw and evaluated the patient, and participated in the management and treatment plan.  Wilhelmenia Harada, MD Attending Physician, Orthopedic Surgery  This document was dictated using Dragon voice recognition software. A reasonable attempt at proof reading has been made to minimize errors.

## 2023-09-09 ENCOUNTER — Telehealth: Payer: Self-pay

## 2023-09-09 ENCOUNTER — Other Ambulatory Visit (HOSPITAL_COMMUNITY): Payer: Self-pay

## 2023-09-09 NOTE — Telephone Encounter (Signed)
 Pharmacy Patient Advocate Encounter   Received notification from Patient Advice Request messages that prior authorization for Qulipta  60MG  tablets is required/requested.   Insurance verification completed.   The patient is insured through [employer] .   Per test claim: PA required; PA submitted to above mentioned insurance via CoverMyMeds Key/confirmation #/EOC [facility] Status is pending

## 2023-09-09 NOTE — Telephone Encounter (Signed)
 Pharmacy Patient Advocate Encounter   Received notification from Patient Advice Request messages that prior authorization for Ubrelvy  100MG  tablets is required/requested.   Insurance verification completed.   The patient is insured through [employer] .   Per test claim: PA required; PA submitted to above mentioned insurance via CoverMyMeds Key/confirmation #/EOC [id number] Status is pending

## 2023-09-10 ENCOUNTER — Other Ambulatory Visit (HOSPITAL_COMMUNITY): Payer: Self-pay

## 2023-09-10 DIAGNOSIS — M25511 Pain in right shoulder: Secondary | ICD-10-CM | POA: Diagnosis not present

## 2023-09-10 DIAGNOSIS — M25611 Stiffness of right shoulder, not elsewhere classified: Secondary | ICD-10-CM | POA: Diagnosis not present

## 2023-09-10 DIAGNOSIS — M25411 Effusion, right shoulder: Secondary | ICD-10-CM | POA: Diagnosis not present

## 2023-09-10 DIAGNOSIS — Z4789 Encounter for other orthopedic aftercare: Secondary | ICD-10-CM | POA: Diagnosis not present

## 2023-09-10 NOTE — Telephone Encounter (Signed)
 Pharmacy Patient Advocate Encounter  Received notification from [employer] that Prior Authorization for Qulipta  60MG  tablets has been APPROVED from 09/09/2023 to 03/07/2024. Ran test claim, Copay is $0. This test claim was processed through [facility]- copay amounts may vary at other pharmacies due to pharmacy/plan contracts, or as the patient moves through the different stages of their insurance plan.   PA #/Case ID/Reference #: PA Case ID #: [id number]

## 2023-09-10 NOTE — Telephone Encounter (Signed)
 Pharmacy Patient Advocate Encounter  Received notification from [employer] that Prior Authorization for Ubrelvy  100MG  tablets has been APPROVED from 09/09/2023 to 09/08/2024. Ran test claim, Copay is $0. This test claim was processed through [facility]- copay amounts may vary at other pharmacies due to pharmacy/plan contracts, or as the patient moves through the different stages of their insurance plan.   PA #/Case ID/Reference #: PA Case ID #: [id number]

## 2023-09-11 ENCOUNTER — Ambulatory Visit
Admission: RE | Admit: 2023-09-11 | Discharge: 2023-09-11 | Disposition: A | Discharge status: Home / Self Care | Source: Ambulatory Visit | Attending: Orthopaedic Surgery | Admitting: Orthopaedic Surgery

## 2023-09-11 ENCOUNTER — Ambulatory Visit: Admitting: Neurology

## 2023-09-11 DIAGNOSIS — M25361 Other instability, right knee: Secondary | ICD-10-CM

## 2023-09-12 DIAGNOSIS — M25511 Pain in right shoulder: Secondary | ICD-10-CM | POA: Diagnosis not present

## 2023-09-12 DIAGNOSIS — M25411 Effusion, right shoulder: Secondary | ICD-10-CM | POA: Diagnosis not present

## 2023-09-12 DIAGNOSIS — Z4789 Encounter for other orthopedic aftercare: Secondary | ICD-10-CM | POA: Diagnosis not present

## 2023-09-12 DIAGNOSIS — M25611 Stiffness of right shoulder, not elsewhere classified: Secondary | ICD-10-CM | POA: Diagnosis not present

## 2023-09-13 DIAGNOSIS — J3081 Allergic rhinitis due to animal (cat) (dog) hair and dander: Secondary | ICD-10-CM | POA: Diagnosis not present

## 2023-09-13 DIAGNOSIS — J301 Allergic rhinitis due to pollen: Secondary | ICD-10-CM | POA: Diagnosis not present

## 2023-09-13 DIAGNOSIS — J3089 Other allergic rhinitis: Secondary | ICD-10-CM | POA: Diagnosis not present

## 2023-09-15 ENCOUNTER — Encounter (HOSPITAL_BASED_OUTPATIENT_CLINIC_OR_DEPARTMENT_OTHER): Payer: Self-pay | Admitting: Orthopaedic Surgery

## 2023-09-16 ENCOUNTER — Encounter (HOSPITAL_BASED_OUTPATIENT_CLINIC_OR_DEPARTMENT_OTHER): Payer: Self-pay | Admitting: Student

## 2023-09-16 ENCOUNTER — Ambulatory Visit (HOSPITAL_BASED_OUTPATIENT_CLINIC_OR_DEPARTMENT_OTHER): Admitting: Student

## 2023-09-16 DIAGNOSIS — M25361 Other instability, right knee: Secondary | ICD-10-CM | POA: Diagnosis not present

## 2023-09-16 NOTE — Progress Notes (Signed)
 Chief Complaint: Right knee pain     History of Present Illness:    Pamela Norton is a 33 y.o. female with a history of bilateral patellar instability who presents today with acute on chronic right knee pain.  States that she is experienced multiple subluxations in both knees in the past and has worked with physical therapy before although this was sometime ago.  Her current episode began about 2 days ago, when pain increased after periods of walking.  States that she developed a sharp pain in the lateral knee and behind the kneecap, worsened with weightbearing.  There has been an increased popping sensation when straightening the knee.  Has trialed ice and NSAIDs.  No known specific history of knee injury.  Denies recent brace usage.   Surgical History:   None  PMH/PSH/Family History/Social History/Meds/Allergies:    Past Medical History:  Diagnosis Date   Anxiety    Asthma    Celiac disease    Complication of anesthesia    Depression    Family history of breast cancer    Family history of colon cancer    Family history of colonic polyps    Family history of melanoma    Family history of thyroid  cancer    Migraines    Past Surgical History:  Procedure Laterality Date   SHOULDER ARTHROSCOPY WITH LABRAL REPAIR Right 07/30/2023   Procedure: ARTHROSCOPY, SHOULDER, WITH GLENOID LABRUM REPAIR;  Surgeon: Genelle Standing, MD;  Location: [facility];  Service: Orthopedics;  Laterality: Right;  RIGHT SHOULDER ARTHROSCOPY WITH INFERIOR LABRAL REPAIR   WISDOM TOOTH EXTRACTION     WRIST SURGERY Left    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    Social History   Socioeconomic History   Marital status: Single    Spouse name: Not on file   Number of children: Not on file   Years of education: Not on file   Highest education level: Not on file  Occupational History   Not on file  Tobacco Use   Smoking status: Never    Smokeless tobacco: Never  Vaping Use   Vaping status: Never Used  Substance and Sexual Activity   Alcohol use: Yes    Alcohol/week: 1.0 standard drink of alcohol    Types: 1 Cans of beer per week   Drug use: No   Sexual activity: Never  Other Topics Concern   Not on file  Social History Narrative   Not on file   Social Drivers of Health   Financial Resource Strain: Not on file  Food Insecurity: Not on file  Transportation Needs: Not on file  Physical Activity: Not on file  Stress: Not on file  Social Connections: Not on file   Family History  Problem Relation Age of Onset   Thyroid  disease Mother    Hypothyroidism Mother    Osteoarthritis Mother    Fibromyalgia Mother    Restless legs syndrome Mother    Thyroid  cancer Mother 53       Papillary   Melanoma Mother 24   Colon polyps Mother        cscope every 3 years   Migraines Mother    Colon polyps Father    Colon cancer Maternal Grandmother 75   Kidney cancer Maternal Grandfather 44  Dementia Paternal Grandmother    Throat cancer Paternal Grandfather    Melanoma Paternal Grandfather    Breast cancer Other    Allergies  Allergen Reactions   Betadine [Povidone-Iodine] Hives   Clarithromycin Anxiety, Nausea Only and Shortness Of Breath   Amphetamine-Dextroamphetamine     Other reaction(s): nausea   Cefaclor     REACTION: Hives Other reaction(s): hives   Food     Multiple Food Allergies: bell Peppers, Garlic, Shellfish, Pine Nuts, Peanuts, Soy, Melon, Watermelon, Honeydew, Cantaloupe, Squash, Pumpkin, Gourd, Zucchini   Meloxicam     Other reaction(s): migraine and hives   Peanuts [Peanut Oil]    Prednisone     Other reaction(s): tachycardia   Soy Allergy (Obsolete)    Coconut (Cocos Nucifera) Dermatitis, Hives and Rash   Latex Hives and Rash   Current Outpatient Medications  Medication Sig Dispense Refill   ondansetron  (ZOFRAN -ODT) 8 MG disintegrating tablet Take 1 tablet (8 mg total) by mouth every 8  (eight) hours as needed for nausea or vomiting. (Patient not taking: Reported on 08/21/2023) 15 tablet 0   albuterol  (ACCUNEB ) 0.63 MG/3ML nebulizer solution Take 1 ampule by nebulization every 6 (six) hours as needed for Wheezing.     aspirin  EC 325 MG tablet Take 1 tablet (325 mg total) by mouth daily. 14 tablet 0   Atogepant  (QULIPTA ) 60 MG TABS Take 1 tablet (60 mg total) by mouth daily. 30 tablet 11   azelastine (ASTELIN) 0.1 % nasal spray Place into both nostrils.     Baclofen 20 MG/20ML SOLN 2 (two) times daily as needed.     budesonide-formoterol (SYMBICORT) 160-4.5 MCG/ACT inhaler USE 2 PUFFS TWICE A DAY     celecoxib  (CELEBREX ) 100 MG capsule Take 1 capsule (100 mg total) by mouth 2 (two) times daily. 60 capsule 2   EPINEPHrine  0.3 mg/0.3 mL IJ SOAJ injection See admin instructions.  0   fexofenadine (ALLEGRA) 180 MG tablet Take 180 mg by mouth in the morning and at bedtime.     FLUoxetine (PROZAC) 20 MG capsule Take 20 mg by mouth every morning.  2   HYDROmorphone  (DILAUDID ) 2 MG tablet Take 1 tablet (2 mg total) by mouth every 4 (four) hours as needed for severe pain (pain score 7-10). (Patient not taking: Reported on 08/21/2023) 30 tablet 0   HYDROmorphone  (DILAUDID ) 2 MG tablet Take 1 tablet (2 mg total) by mouth every 4 (four) hours as needed for severe pain (pain score 7-10). (Patient not taking: Reported on 08/21/2023) 20 tablet 0   montelukast (SINGULAIR) 10 MG tablet Take 10 mg by mouth at bedtime.     Multiple Vitamin (MULTIVITAMIN) capsule Take 1 capsule by mouth daily.     omeprazole (PRILOSEC) 20 MG capsule Take 20 mg by mouth daily.     scopolamine  (TRANSDERM-SCOP) 1 MG/3DAYS Place 1 patch (1.5 mg total) onto the skin every 3 (three) days. (Patient not taking: Reported on 08/21/2023) 10 patch 0   SPIRIVA RESPIMAT 1.25 MCG/ACT AERS SMARTSIG:2 Puff(s) Via Inhaler Daily     triamcinolone  (NASACORT ) 55 MCG/ACT AERO nasal inhaler Place 2 sprays into the nose daily.     UBRELVY  100 MG  TABS Take 100 mg by mouth if needed as close to onset of migraine as possible. May repeat in 2 hours if headache persists or returns. Do not exceed 2 tablets in 24 hours. 10 tablet 11   No current facility-administered medications for this visit.   No results found.  Review of  Systems:   A ROS was performed including pertinent positives and negatives as documented in the HPI.  Physical Exam :   Constitutional: NAD and appears stated age Neurological: Alert and oriented Psych: Appropriate affect and cooperative There were no vitals taken for this visit.   Comprehensive Musculoskeletal Exam:    Right knee exam demonstrates active range of motion from 0 to 120 degrees.  No significant medial or lateral joint line tenderness.  Increased patellar translation in extension with negative apprehension.  Stable collaterals with varus and valgus stress.  Imaging:   MRI right knee: Normal-appearing MRI of the right knee   I personally reviewed and interpreted the radiographs.   Assessment:   33 y.o. female with history of bilateral patellar instability.  She has been experiencing symptoms mainly in the right knee which have worsened over the past few days.  Recent MRIs of both knees are generally well-appearing.  Discussed that initial treatment for this would likely be conservative therefore I would like to refer her to physical therapy with particular focus on knee and quadricep strengthening to help improve stability.  Provided a patella stabilizing brace which she can use during higher levels and/or longer periods of activity.  She does have a follow-up scheduled with Dr. Genelle on 7/23, so will have him reevaluate at that time and review both MRIs.  Plan :    - Referral to physical therapy for right knee strengthening - Patella stabilizing brace provided today - Return on 7/23 as scheduled with Dr. Genelle     I personally saw and evaluated the patient, and participated in the  management and treatment plan.  Leonce Reveal, PA-C Orthopedics

## 2023-09-17 DIAGNOSIS — M25511 Pain in right shoulder: Secondary | ICD-10-CM | POA: Diagnosis not present

## 2023-09-17 DIAGNOSIS — M25611 Stiffness of right shoulder, not elsewhere classified: Secondary | ICD-10-CM | POA: Diagnosis not present

## 2023-09-17 DIAGNOSIS — Z4789 Encounter for other orthopedic aftercare: Secondary | ICD-10-CM | POA: Diagnosis not present

## 2023-09-17 DIAGNOSIS — M25411 Effusion, right shoulder: Secondary | ICD-10-CM | POA: Diagnosis not present

## 2023-09-19 DIAGNOSIS — M25411 Effusion, right shoulder: Secondary | ICD-10-CM | POA: Diagnosis not present

## 2023-09-19 DIAGNOSIS — J301 Allergic rhinitis due to pollen: Secondary | ICD-10-CM | POA: Diagnosis not present

## 2023-09-19 DIAGNOSIS — Z4789 Encounter for other orthopedic aftercare: Secondary | ICD-10-CM | POA: Diagnosis not present

## 2023-09-19 DIAGNOSIS — J3081 Allergic rhinitis due to animal (cat) (dog) hair and dander: Secondary | ICD-10-CM | POA: Diagnosis not present

## 2023-09-19 DIAGNOSIS — M25611 Stiffness of right shoulder, not elsewhere classified: Secondary | ICD-10-CM | POA: Diagnosis not present

## 2023-09-19 DIAGNOSIS — M25511 Pain in right shoulder: Secondary | ICD-10-CM | POA: Diagnosis not present

## 2023-09-19 DIAGNOSIS — J3089 Other allergic rhinitis: Secondary | ICD-10-CM | POA: Diagnosis not present

## 2023-09-24 ENCOUNTER — Other Ambulatory Visit (HOSPITAL_COMMUNITY): Payer: Self-pay

## 2023-09-24 DIAGNOSIS — M25611 Stiffness of right shoulder, not elsewhere classified: Secondary | ICD-10-CM | POA: Diagnosis not present

## 2023-09-24 DIAGNOSIS — M25411 Effusion, right shoulder: Secondary | ICD-10-CM | POA: Diagnosis not present

## 2023-09-24 DIAGNOSIS — Z4789 Encounter for other orthopedic aftercare: Secondary | ICD-10-CM | POA: Diagnosis not present

## 2023-09-24 DIAGNOSIS — M25511 Pain in right shoulder: Secondary | ICD-10-CM | POA: Diagnosis not present

## 2023-09-26 DIAGNOSIS — Z4789 Encounter for other orthopedic aftercare: Secondary | ICD-10-CM | POA: Diagnosis not present

## 2023-09-26 DIAGNOSIS — Z1283 Encounter for screening for malignant neoplasm of skin: Secondary | ICD-10-CM | POA: Diagnosis not present

## 2023-09-26 DIAGNOSIS — D225 Melanocytic nevi of trunk: Secondary | ICD-10-CM | POA: Diagnosis not present

## 2023-09-26 DIAGNOSIS — M25511 Pain in right shoulder: Secondary | ICD-10-CM | POA: Diagnosis not present

## 2023-09-26 DIAGNOSIS — D485 Neoplasm of uncertain behavior of skin: Secondary | ICD-10-CM | POA: Diagnosis not present

## 2023-09-26 DIAGNOSIS — M25611 Stiffness of right shoulder, not elsewhere classified: Secondary | ICD-10-CM | POA: Diagnosis not present

## 2023-09-26 DIAGNOSIS — M25411 Effusion, right shoulder: Secondary | ICD-10-CM | POA: Diagnosis not present

## 2023-09-27 DIAGNOSIS — J301 Allergic rhinitis due to pollen: Secondary | ICD-10-CM | POA: Diagnosis not present

## 2023-09-27 DIAGNOSIS — J3081 Allergic rhinitis due to animal (cat) (dog) hair and dander: Secondary | ICD-10-CM | POA: Diagnosis not present

## 2023-09-27 DIAGNOSIS — J3089 Other allergic rhinitis: Secondary | ICD-10-CM | POA: Diagnosis not present

## 2023-10-01 ENCOUNTER — Other Ambulatory Visit (HOSPITAL_COMMUNITY): Payer: Self-pay

## 2023-10-01 DIAGNOSIS — M25611 Stiffness of right shoulder, not elsewhere classified: Secondary | ICD-10-CM | POA: Diagnosis not present

## 2023-10-01 DIAGNOSIS — M25411 Effusion, right shoulder: Secondary | ICD-10-CM | POA: Diagnosis not present

## 2023-10-01 DIAGNOSIS — Z4789 Encounter for other orthopedic aftercare: Secondary | ICD-10-CM | POA: Diagnosis not present

## 2023-10-01 DIAGNOSIS — M25511 Pain in right shoulder: Secondary | ICD-10-CM | POA: Diagnosis not present

## 2023-10-03 DIAGNOSIS — M25511 Pain in right shoulder: Secondary | ICD-10-CM | POA: Diagnosis not present

## 2023-10-03 DIAGNOSIS — M25411 Effusion, right shoulder: Secondary | ICD-10-CM | POA: Diagnosis not present

## 2023-10-03 DIAGNOSIS — Z4789 Encounter for other orthopedic aftercare: Secondary | ICD-10-CM | POA: Diagnosis not present

## 2023-10-03 DIAGNOSIS — M25611 Stiffness of right shoulder, not elsewhere classified: Secondary | ICD-10-CM | POA: Diagnosis not present

## 2023-10-04 DIAGNOSIS — J3081 Allergic rhinitis due to animal (cat) (dog) hair and dander: Secondary | ICD-10-CM | POA: Diagnosis not present

## 2023-10-04 DIAGNOSIS — J3089 Other allergic rhinitis: Secondary | ICD-10-CM | POA: Diagnosis not present

## 2023-10-04 DIAGNOSIS — J301 Allergic rhinitis due to pollen: Secondary | ICD-10-CM | POA: Diagnosis not present

## 2023-10-08 DIAGNOSIS — Z4789 Encounter for other orthopedic aftercare: Secondary | ICD-10-CM | POA: Diagnosis not present

## 2023-10-08 DIAGNOSIS — M25411 Effusion, right shoulder: Secondary | ICD-10-CM | POA: Diagnosis not present

## 2023-10-08 DIAGNOSIS — M25511 Pain in right shoulder: Secondary | ICD-10-CM | POA: Diagnosis not present

## 2023-10-08 DIAGNOSIS — M25611 Stiffness of right shoulder, not elsewhere classified: Secondary | ICD-10-CM | POA: Diagnosis not present

## 2023-10-09 ENCOUNTER — Ambulatory Visit (HOSPITAL_BASED_OUTPATIENT_CLINIC_OR_DEPARTMENT_OTHER): Admitting: Orthopaedic Surgery

## 2023-10-09 DIAGNOSIS — M25361 Other instability, right knee: Secondary | ICD-10-CM

## 2023-10-09 MED ORDER — TRIAMCINOLONE ACETONIDE 40 MG/ML IJ SUSP
80.0000 mg | INTRAMUSCULAR | Status: AC | PRN
Start: 2023-10-09 — End: 2023-10-09
  Administered 2023-10-09: 80 mg via INTRA_ARTICULAR

## 2023-10-09 MED ORDER — LIDOCAINE HCL 1 % IJ SOLN
4.0000 mL | INTRAMUSCULAR | Status: AC | PRN
  Administered 2023-10-09: 4 mL

## 2023-10-09 NOTE — Progress Notes (Signed)
 Post Operative Evaluation    Procedure/Date of Surgery: Right shoulder arthroscopy with capsulorrhaphy labral repair 5/13  Interval History:   Presents today for follow up of her right shoulder.  At this time she is still having some persistent deficits with terminal flexion consistent with capsulitis.  She is also here today for MRI discussion of both knees PMH/PSH/Family History/Social History/Meds/Allergies:    Past Medical History:  Diagnosis Date  . Anxiety   . Asthma   . Celiac disease   . Complication of anesthesia   . Depression   . Family history of breast cancer   . Family history of colon cancer   . Family history of colonic polyps   . Family history of melanoma   . Family history of thyroid  cancer   . Migraines    Past Surgical History:  Procedure Laterality Date  . SHOULDER ARTHROSCOPY WITH LABRAL REPAIR Right 07/30/2023   Procedure: ARTHROSCOPY, SHOULDER, WITH GLENOID LABRUM REPAIR;  Surgeon: Genelle Standing, MD;  Location: [facility];  Service: Orthopedics;  Laterality: Right;  RIGHT SHOULDER ARTHROSCOPY WITH INFERIOR LABRAL REPAIR  . WISDOM TOOTH EXTRACTION    . 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 Marital status: Single    Spouse name: Not on file  . Number of children: Not on file  . Years of education: Not on file  . Highest education level: Not on file  Occupational History  . Not on file  Tobacco Use  . Smoking status: Never  . Smokeless tobacco: Never  Vaping Use  . Vaping status: Never Used  Substance and Sexual Activity  . Alcohol use: Yes    Alcohol/week: 1.0 standard drink of alcohol    Types: 1 Cans of beer per week  . Drug use: No  . Sexual activity: Never  Other Topics Concern  . Not on file  Social History Narrative  . Not on file   Social Drivers of Health   Financial Resource Strain: Not on file   Food Insecurity: Not on file  Transportation Needs: Not on file  Physical Activity: Not on file  Stress: Not on file  Social Connections: Not on file   Family History  Problem Relation Age of Onset  . Thyroid  disease Mother   . Hypothyroidism Mother   . Osteoarthritis Mother   . Fibromyalgia Mother   . Restless legs syndrome Mother   . Thyroid  cancer Mother 2       Papillary  . Melanoma Mother 54  . Colon polyps Mother        cscope every 3 years  . Migraines Mother   . Colon polyps Father   . Colon cancer Maternal Grandmother 77  . Kidney cancer Maternal Grandfather 58  . Dementia Paternal Grandmother   . Throat cancer Paternal Grandfather   . Melanoma Paternal Grandfather   . Breast cancer Other    Allergies  Allergen Reactions  . Betadine [Povidone-Iodine] Hives  . Clarithromycin Anxiety, Nausea Only and Shortness Of Breath  . Amphetamine-Dextroamphetamine     Other reaction(s): nausea  . Cefaclor     REACTION: Hives Other reaction(s): hives  . Food     Multiple Food Allergies: bell Peppers, Garlic, Shellfish,  Pine Nuts, Peanuts, Soy, Melon, Watermelon, Honeydew, Cantaloupe, Squash, Pumpkin, Gourd, Zucchini  . Meloxicam     Other reaction(s): migraine and hives  . Peanuts [Peanut Oil]   . Prednisone     Other reaction(s): tachycardia  . Soy Allergy (Obsolete)   . Coconut (Cocos Nucifera) Dermatitis, Hives and Rash  . Latex Hives and Rash   Current Outpatient Medications  Medication Sig Dispense Refill  . ondansetron  (ZOFRAN -ODT) 8 MG disintegrating tablet Take 1 tablet (8 mg total) by mouth every 8 (eight) hours as needed for nausea or vomiting. (Patient not taking: Reported on 08/21/2023) 15 tablet 0  . albuterol  (ACCUNEB ) 0.63 MG/3ML nebulizer solution Take 1 ampule by nebulization every 6 (six) hours as needed for Wheezing.    . aspirin  EC 325 MG tablet Take 1 tablet (325 mg total) by mouth daily. 14 tablet 0  . Atogepant  (QULIPTA ) 60 MG TABS Take 1 tablet  (60 mg total) by mouth daily. 30 tablet 11  . azelastine (ASTELIN) 0.1 % nasal spray Place into both nostrils.    . Baclofen 20 MG/20ML SOLN 2 (two) times daily as needed.    . budesonide-formoterol (SYMBICORT) 160-4.5 MCG/ACT inhaler USE 2 PUFFS TWICE A DAY    . celecoxib  (CELEBREX ) 100 MG capsule Take 1 capsule (100 mg total) by mouth 2 (two) times daily. 60 capsule 2  . EPINEPHrine  0.3 mg/0.3 mL IJ SOAJ injection See admin instructions.  0  . fexofenadine (ALLEGRA) 180 MG tablet Take 180 mg by mouth in the morning and at bedtime.    SABRA FLUoxetine (PROZAC) 20 MG capsule Take 20 mg by mouth every morning.  2  . HYDROmorphone  (DILAUDID ) 2 MG tablet Take 1 tablet (2 mg total) by mouth every 4 (four) hours as needed for severe pain (pain score 7-10). (Patient not taking: Reported on 08/21/2023) 30 tablet 0  . HYDROmorphone  (DILAUDID ) 2 MG tablet Take 1 tablet (2 mg total) by mouth every 4 (four) hours as needed for severe pain (pain score 7-10). (Patient not taking: Reported on 08/21/2023) 20 tablet 0  . montelukast (SINGULAIR) 10 MG tablet Take 10 mg by mouth at bedtime.    . Multiple Vitamin (MULTIVITAMIN) capsule Take 1 capsule by mouth daily.    SABRA omeprazole (PRILOSEC) 20 MG capsule Take 20 mg by mouth daily.    . scopolamine  (TRANSDERM-SCOP) 1 MG/3DAYS Place 1 patch (1.5 mg total) onto the skin every 3 (three) days. (Patient not taking: Reported on 08/21/2023) 10 patch 0  . SPIRIVA RESPIMAT 1.25 MCG/ACT AERS SMARTSIG:2 Puff(s) Via Inhaler Daily    . triamcinolone  (NASACORT ) 55 MCG/ACT AERO nasal inhaler Place 2 sprays into the nose daily.    . UBRELVY  100 MG TABS Take 100 mg by mouth if needed as close to onset of migraine as possible. May repeat in 2 hours if headache persists or returns. Do not exceed 2 tablets in 24 hours. 10 tablet 11   No current facility-administered medications for this visit.   No results found.  Review of Systems:   A ROS was performed including pertinent positives and  negatives as documented in the HPI.   Musculoskeletal Exam:      Shoulder with range of motion is to 135 degrees degrees with external rotation at the side to 30 degrees.  Internal rotation is to L1  Bilateral patellofemoral joint with significant 4+ laxity with the knee in full extension.  Otherwise range of motion is from 0 to 120 degrees.  There is positive  patellar crepitus with grind bilaterally  Imaging:    MRI right knee, MRI left knee: Bilateral elevated TT-TG distance of 15 mm with lateral patellar subluxation with preserved patellofemoral space and atrophy and thinning of the MPFL bilaterally  I personally reviewed and interpreted the radiographs.   Assessment:   12 weeks status post right shoulder capsulorrhaphy doing extremely well.  Today's visit she does have some evidence of capsulitis for which I recommended ultrasound-guided injection of glenohumeral joint.  Will plan to proceed with this.  I did also discuss that her MRIs of bilateral knees do show evidence of an elevated TT-TG distance of 15 consistent with her known history of patella subluxation of which she has more recently had an occurrence on the right side.  I did this would progress briefly the possibility of a right knee MPFL reconstruction internal brace although we will plan to defer this today as she still is undergoing recovery from the right shoulder  Plan :    - Return to clinic 6 weeks for reassessment    Procedure Note  Patient: Pamela Norton             Date of Birth: 01-Jun-1990           MRN: [id number]             Visit Date: 10/09/2023  Procedures: Visit Diagnoses: No diagnosis found.  Large Joint Inj: R glenohumeral on 10/09/2023 4:30 PM Indications: pain Details: 22 G 1.5 in needle, ultrasound-guided anterior approach  Arthrogram: No  Medications: 4 mL lidocaine  1 %; 80 mg triamcinolone  acetonide 40 MG/ML Outcome: tolerated well, no immediate complications Procedure, treatment  alternatives, risks and benefits explained, specific risks discussed. Consent was given by the patient. Immediately prior to procedure a time out was called to verify the correct patient, procedure, equipment, support staff and site/side marked as required. Patient was prepped and draped in the usual sterile fashion.            I personally saw and evaluated the patient, and participated in the management and treatment plan.  Elspeth Parker, MD Attending Physician, Orthopedic Surgery  This document was dictated using Dragon voice recognition software. A reasonable attempt at proof reading has been made to minimize errors.

## 2023-10-10 DIAGNOSIS — R5382 Chronic fatigue, unspecified: Secondary | ICD-10-CM | POA: Diagnosis not present

## 2023-10-10 DIAGNOSIS — R7989 Other specified abnormal findings of blood chemistry: Secondary | ICD-10-CM | POA: Diagnosis not present

## 2023-10-10 DIAGNOSIS — Z0000 Encounter for general adult medical examination without abnormal findings: Secondary | ICD-10-CM | POA: Diagnosis not present

## 2023-10-10 DIAGNOSIS — R7309 Other abnormal glucose: Secondary | ICD-10-CM | POA: Diagnosis not present

## 2023-10-11 DIAGNOSIS — J301 Allergic rhinitis due to pollen: Secondary | ICD-10-CM | POA: Diagnosis not present

## 2023-10-11 DIAGNOSIS — Z4789 Encounter for other orthopedic aftercare: Secondary | ICD-10-CM | POA: Diagnosis not present

## 2023-10-11 DIAGNOSIS — M25511 Pain in right shoulder: Secondary | ICD-10-CM | POA: Diagnosis not present

## 2023-10-11 DIAGNOSIS — M25611 Stiffness of right shoulder, not elsewhere classified: Secondary | ICD-10-CM | POA: Diagnosis not present

## 2023-10-11 DIAGNOSIS — M25411 Effusion, right shoulder: Secondary | ICD-10-CM | POA: Diagnosis not present

## 2023-10-11 DIAGNOSIS — J3081 Allergic rhinitis due to animal (cat) (dog) hair and dander: Secondary | ICD-10-CM | POA: Diagnosis not present

## 2023-10-11 DIAGNOSIS — J3089 Other allergic rhinitis: Secondary | ICD-10-CM | POA: Diagnosis not present

## 2023-10-15 DIAGNOSIS — M25511 Pain in right shoulder: Secondary | ICD-10-CM | POA: Diagnosis not present

## 2023-10-15 DIAGNOSIS — M25611 Stiffness of right shoulder, not elsewhere classified: Secondary | ICD-10-CM | POA: Diagnosis not present

## 2023-10-15 DIAGNOSIS — M25411 Effusion, right shoulder: Secondary | ICD-10-CM | POA: Diagnosis not present

## 2023-10-15 DIAGNOSIS — Z4789 Encounter for other orthopedic aftercare: Secondary | ICD-10-CM | POA: Diagnosis not present

## 2023-10-17 DIAGNOSIS — Z0000 Encounter for general adult medical examination without abnormal findings: Secondary | ICD-10-CM | POA: Diagnosis not present

## 2023-10-17 DIAGNOSIS — Z23 Encounter for immunization: Secondary | ICD-10-CM | POA: Diagnosis not present

## 2023-10-17 DIAGNOSIS — R7989 Other specified abnormal findings of blood chemistry: Secondary | ICD-10-CM | POA: Diagnosis not present

## 2023-10-17 DIAGNOSIS — R7309 Other abnormal glucose: Secondary | ICD-10-CM | POA: Diagnosis not present

## 2023-10-17 DIAGNOSIS — R5382 Chronic fatigue, unspecified: Secondary | ICD-10-CM | POA: Diagnosis not present

## 2023-10-18 DIAGNOSIS — M25511 Pain in right shoulder: Secondary | ICD-10-CM | POA: Diagnosis not present

## 2023-10-18 DIAGNOSIS — M25611 Stiffness of right shoulder, not elsewhere classified: Secondary | ICD-10-CM | POA: Diagnosis not present

## 2023-10-18 DIAGNOSIS — Z4789 Encounter for other orthopedic aftercare: Secondary | ICD-10-CM | POA: Diagnosis not present

## 2023-10-18 DIAGNOSIS — M25411 Effusion, right shoulder: Secondary | ICD-10-CM | POA: Diagnosis not present

## 2023-10-21 ENCOUNTER — Encounter: Payer: Self-pay | Admitting: Neurology

## 2023-10-21 DIAGNOSIS — M25411 Effusion, right shoulder: Secondary | ICD-10-CM | POA: Diagnosis not present

## 2023-10-21 DIAGNOSIS — M25511 Pain in right shoulder: Secondary | ICD-10-CM | POA: Diagnosis not present

## 2023-10-21 DIAGNOSIS — Z4789 Encounter for other orthopedic aftercare: Secondary | ICD-10-CM | POA: Diagnosis not present

## 2023-10-21 DIAGNOSIS — M25611 Stiffness of right shoulder, not elsewhere classified: Secondary | ICD-10-CM | POA: Diagnosis not present

## 2023-10-24 DIAGNOSIS — M25511 Pain in right shoulder: Secondary | ICD-10-CM | POA: Diagnosis not present

## 2023-10-24 DIAGNOSIS — M25411 Effusion, right shoulder: Secondary | ICD-10-CM | POA: Diagnosis not present

## 2023-10-24 DIAGNOSIS — Z4789 Encounter for other orthopedic aftercare: Secondary | ICD-10-CM | POA: Diagnosis not present

## 2023-10-24 DIAGNOSIS — M25611 Stiffness of right shoulder, not elsewhere classified: Secondary | ICD-10-CM | POA: Diagnosis not present

## 2023-10-25 DIAGNOSIS — D485 Neoplasm of uncertain behavior of skin: Secondary | ICD-10-CM | POA: Diagnosis not present

## 2023-10-25 DIAGNOSIS — L308 Other specified dermatitis: Secondary | ICD-10-CM | POA: Diagnosis not present

## 2023-10-29 DIAGNOSIS — M25511 Pain in right shoulder: Secondary | ICD-10-CM | POA: Diagnosis not present

## 2023-10-29 DIAGNOSIS — Z4789 Encounter for other orthopedic aftercare: Secondary | ICD-10-CM | POA: Diagnosis not present

## 2023-10-29 DIAGNOSIS — M25411 Effusion, right shoulder: Secondary | ICD-10-CM | POA: Diagnosis not present

## 2023-10-29 DIAGNOSIS — M25611 Stiffness of right shoulder, not elsewhere classified: Secondary | ICD-10-CM | POA: Diagnosis not present

## 2023-11-01 DIAGNOSIS — M25511 Pain in right shoulder: Secondary | ICD-10-CM | POA: Diagnosis not present

## 2023-11-01 DIAGNOSIS — J3089 Other allergic rhinitis: Secondary | ICD-10-CM | POA: Diagnosis not present

## 2023-11-01 DIAGNOSIS — J301 Allergic rhinitis due to pollen: Secondary | ICD-10-CM | POA: Diagnosis not present

## 2023-11-01 DIAGNOSIS — Z4789 Encounter for other orthopedic aftercare: Secondary | ICD-10-CM | POA: Diagnosis not present

## 2023-11-01 DIAGNOSIS — M25411 Effusion, right shoulder: Secondary | ICD-10-CM | POA: Diagnosis not present

## 2023-11-01 DIAGNOSIS — J3081 Allergic rhinitis due to animal (cat) (dog) hair and dander: Secondary | ICD-10-CM | POA: Diagnosis not present

## 2023-11-01 DIAGNOSIS — M25611 Stiffness of right shoulder, not elsewhere classified: Secondary | ICD-10-CM | POA: Diagnosis not present

## 2023-11-07 DIAGNOSIS — Z4789 Encounter for other orthopedic aftercare: Secondary | ICD-10-CM | POA: Diagnosis not present

## 2023-11-07 DIAGNOSIS — M25511 Pain in right shoulder: Secondary | ICD-10-CM | POA: Diagnosis not present

## 2023-11-07 DIAGNOSIS — M25611 Stiffness of right shoulder, not elsewhere classified: Secondary | ICD-10-CM | POA: Diagnosis not present

## 2023-11-07 DIAGNOSIS — M25411 Effusion, right shoulder: Secondary | ICD-10-CM | POA: Diagnosis not present

## 2023-11-08 DIAGNOSIS — J301 Allergic rhinitis due to pollen: Secondary | ICD-10-CM | POA: Diagnosis not present

## 2023-11-08 DIAGNOSIS — J3081 Allergic rhinitis due to animal (cat) (dog) hair and dander: Secondary | ICD-10-CM | POA: Diagnosis not present

## 2023-11-08 DIAGNOSIS — J3089 Other allergic rhinitis: Secondary | ICD-10-CM | POA: Diagnosis not present

## 2023-11-12 DIAGNOSIS — M25611 Stiffness of right shoulder, not elsewhere classified: Secondary | ICD-10-CM | POA: Diagnosis not present

## 2023-11-12 DIAGNOSIS — M25511 Pain in right shoulder: Secondary | ICD-10-CM | POA: Diagnosis not present

## 2023-11-12 DIAGNOSIS — M25411 Effusion, right shoulder: Secondary | ICD-10-CM | POA: Diagnosis not present

## 2023-11-12 DIAGNOSIS — J3089 Other allergic rhinitis: Secondary | ICD-10-CM | POA: Diagnosis not present

## 2023-11-12 DIAGNOSIS — Z4789 Encounter for other orthopedic aftercare: Secondary | ICD-10-CM | POA: Diagnosis not present

## 2023-11-14 DIAGNOSIS — M25511 Pain in right shoulder: Secondary | ICD-10-CM | POA: Diagnosis not present

## 2023-11-14 DIAGNOSIS — M25611 Stiffness of right shoulder, not elsewhere classified: Secondary | ICD-10-CM | POA: Diagnosis not present

## 2023-11-14 DIAGNOSIS — M25411 Effusion, right shoulder: Secondary | ICD-10-CM | POA: Diagnosis not present

## 2023-11-14 DIAGNOSIS — Z4789 Encounter for other orthopedic aftercare: Secondary | ICD-10-CM | POA: Diagnosis not present

## 2023-11-22 DIAGNOSIS — M25611 Stiffness of right shoulder, not elsewhere classified: Secondary | ICD-10-CM | POA: Diagnosis not present

## 2023-11-22 DIAGNOSIS — M25511 Pain in right shoulder: Secondary | ICD-10-CM | POA: Diagnosis not present

## 2023-11-22 DIAGNOSIS — Z4789 Encounter for other orthopedic aftercare: Secondary | ICD-10-CM | POA: Diagnosis not present

## 2023-11-22 DIAGNOSIS — J3089 Other allergic rhinitis: Secondary | ICD-10-CM | POA: Diagnosis not present

## 2023-11-22 DIAGNOSIS — J3081 Allergic rhinitis due to animal (cat) (dog) hair and dander: Secondary | ICD-10-CM | POA: Diagnosis not present

## 2023-11-22 DIAGNOSIS — M25411 Effusion, right shoulder: Secondary | ICD-10-CM | POA: Diagnosis not present

## 2023-11-22 DIAGNOSIS — J301 Allergic rhinitis due to pollen: Secondary | ICD-10-CM | POA: Diagnosis not present

## 2023-11-26 DIAGNOSIS — L2089 Other atopic dermatitis: Secondary | ICD-10-CM | POA: Diagnosis not present

## 2023-11-26 DIAGNOSIS — J4540 Moderate persistent asthma, uncomplicated: Secondary | ICD-10-CM | POA: Diagnosis not present

## 2023-11-26 DIAGNOSIS — M25611 Stiffness of right shoulder, not elsewhere classified: Secondary | ICD-10-CM | POA: Diagnosis not present

## 2023-11-26 DIAGNOSIS — J4541 Moderate persistent asthma with (acute) exacerbation: Secondary | ICD-10-CM | POA: Diagnosis not present

## 2023-11-26 DIAGNOSIS — M25511 Pain in right shoulder: Secondary | ICD-10-CM | POA: Diagnosis not present

## 2023-11-26 DIAGNOSIS — M25411 Effusion, right shoulder: Secondary | ICD-10-CM | POA: Diagnosis not present

## 2023-11-26 DIAGNOSIS — J301 Allergic rhinitis due to pollen: Secondary | ICD-10-CM | POA: Diagnosis not present

## 2023-11-26 DIAGNOSIS — Z4789 Encounter for other orthopedic aftercare: Secondary | ICD-10-CM | POA: Diagnosis not present

## 2023-11-28 DIAGNOSIS — M25511 Pain in right shoulder: Secondary | ICD-10-CM | POA: Diagnosis not present

## 2023-11-28 DIAGNOSIS — Z4789 Encounter for other orthopedic aftercare: Secondary | ICD-10-CM | POA: Diagnosis not present

## 2023-11-28 DIAGNOSIS — M25411 Effusion, right shoulder: Secondary | ICD-10-CM | POA: Diagnosis not present

## 2023-11-28 DIAGNOSIS — M25611 Stiffness of right shoulder, not elsewhere classified: Secondary | ICD-10-CM | POA: Diagnosis not present

## 2023-12-03 DIAGNOSIS — Z4789 Encounter for other orthopedic aftercare: Secondary | ICD-10-CM | POA: Diagnosis not present

## 2023-12-03 DIAGNOSIS — M25611 Stiffness of right shoulder, not elsewhere classified: Secondary | ICD-10-CM | POA: Diagnosis not present

## 2023-12-03 DIAGNOSIS — M25411 Effusion, right shoulder: Secondary | ICD-10-CM | POA: Diagnosis not present

## 2023-12-03 DIAGNOSIS — M25511 Pain in right shoulder: Secondary | ICD-10-CM | POA: Diagnosis not present

## 2023-12-05 DIAGNOSIS — Z4789 Encounter for other orthopedic aftercare: Secondary | ICD-10-CM | POA: Diagnosis not present

## 2023-12-05 DIAGNOSIS — M25411 Effusion, right shoulder: Secondary | ICD-10-CM | POA: Diagnosis not present

## 2023-12-05 DIAGNOSIS — M25511 Pain in right shoulder: Secondary | ICD-10-CM | POA: Diagnosis not present

## 2023-12-05 DIAGNOSIS — M25611 Stiffness of right shoulder, not elsewhere classified: Secondary | ICD-10-CM | POA: Diagnosis not present

## 2023-12-06 DIAGNOSIS — J3081 Allergic rhinitis due to animal (cat) (dog) hair and dander: Secondary | ICD-10-CM | POA: Diagnosis not present

## 2023-12-06 DIAGNOSIS — J3089 Other allergic rhinitis: Secondary | ICD-10-CM | POA: Diagnosis not present

## 2023-12-06 DIAGNOSIS — J301 Allergic rhinitis due to pollen: Secondary | ICD-10-CM | POA: Diagnosis not present

## 2023-12-10 DIAGNOSIS — M25411 Effusion, right shoulder: Secondary | ICD-10-CM | POA: Diagnosis not present

## 2023-12-10 DIAGNOSIS — Z4789 Encounter for other orthopedic aftercare: Secondary | ICD-10-CM | POA: Diagnosis not present

## 2023-12-10 DIAGNOSIS — M25511 Pain in right shoulder: Secondary | ICD-10-CM | POA: Diagnosis not present

## 2023-12-10 DIAGNOSIS — M25611 Stiffness of right shoulder, not elsewhere classified: Secondary | ICD-10-CM | POA: Diagnosis not present

## 2023-12-11 ENCOUNTER — Ambulatory Visit (HOSPITAL_BASED_OUTPATIENT_CLINIC_OR_DEPARTMENT_OTHER): Payer: Self-pay | Admitting: Orthopaedic Surgery

## 2023-12-11 ENCOUNTER — Ambulatory Visit (INDEPENDENT_AMBULATORY_CARE_PROVIDER_SITE_OTHER): Admitting: Orthopaedic Surgery

## 2023-12-11 ENCOUNTER — Other Ambulatory Visit (HOSPITAL_BASED_OUTPATIENT_CLINIC_OR_DEPARTMENT_OTHER): Payer: Self-pay

## 2023-12-11 DIAGNOSIS — J301 Allergic rhinitis due to pollen: Secondary | ICD-10-CM | POA: Diagnosis not present

## 2023-12-11 DIAGNOSIS — M25361 Other instability, right knee: Secondary | ICD-10-CM | POA: Diagnosis not present

## 2023-12-11 DIAGNOSIS — J3081 Allergic rhinitis due to animal (cat) (dog) hair and dander: Secondary | ICD-10-CM | POA: Diagnosis not present

## 2023-12-11 DIAGNOSIS — M23361 Other meniscus derangements, other lateral meniscus, right knee: Secondary | ICD-10-CM | POA: Diagnosis not present

## 2023-12-11 DIAGNOSIS — J4540 Moderate persistent asthma, uncomplicated: Secondary | ICD-10-CM | POA: Diagnosis not present

## 2023-12-11 DIAGNOSIS — J3089 Other allergic rhinitis: Secondary | ICD-10-CM | POA: Diagnosis not present

## 2023-12-11 MED ORDER — SCOPOLAMINE 1 MG/3DAYS TD PT72
1.0000 | MEDICATED_PATCH | TRANSDERMAL | 12 refills | Status: AC
  Filled 2023-12-11: qty 5, 15d supply, fill #0

## 2023-12-11 MED ORDER — IBUPROFEN 800 MG PO TABS
800.0000 mg | ORAL_TABLET | Freq: Three times a day (TID) | ORAL | 0 refills | Status: AC
  Filled 2023-12-11: qty 30, 10d supply, fill #0

## 2023-12-11 MED ORDER — HYDROMORPHONE HCL 2 MG PO TABS
2.0000 mg | ORAL_TABLET | ORAL | 0 refills | Status: AC | PRN
  Filled 2023-12-11: qty 15, 3d supply, fill #0

## 2023-12-11 MED ORDER — ACETAMINOPHEN 500 MG PO TABS
500.0000 mg | ORAL_TABLET | Freq: Three times a day (TID) | ORAL | 0 refills | Status: AC
  Filled 2023-12-11: qty 30, 10d supply, fill #0

## 2023-12-11 MED ORDER — ASPIRIN 325 MG PO TBEC
325.0000 mg | DELAYED_RELEASE_TABLET | Freq: Every day | ORAL | 0 refills | Status: DC
  Filled 2023-12-11: qty 14, 14d supply, fill #0

## 2023-12-11 NOTE — Progress Notes (Signed)
 Post Operative Evaluation    Procedure/Date of Surgery: Right shoulder arthroscopy with capsulorrhaphy labral repair 5/13  Interval History:   Presents today for follow up of her right shoulder.  Overall she is continuing to make headway and is doing quite well after a recent glenohumeral injection.  Range of motion is now normalized.  There is some occasional shifting although stability is quite improved compared to the contralateral side   PMH/PSH/Family History/Social History/Meds/Allergies:    Past Medical History:  Diagnosis Date   Anxiety    Asthma    Celiac disease    Complication of anesthesia    Depression    Family history of breast cancer    Family history of colon cancer    Family history of colonic polyps    Family history of melanoma    Family history of thyroid  cancer    Migraines    Past Surgical History:  Procedure Laterality Date   SHOULDER ARTHROSCOPY WITH LABRAL REPAIR Right 07/30/2023   Procedure: ARTHROSCOPY, SHOULDER, WITH GLENOID LABRUM REPAIR;  Surgeon: Genelle Standing, MD;  Location: [facility];  Service: Orthopedics;  Laterality: Right;  RIGHT SHOULDER ARTHROSCOPY WITH INFERIOR LABRAL REPAIR   WISDOM TOOTH EXTRACTION     WRIST SURGERY Left    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    Social History   Socioeconomic History   Marital status: Single    Spouse name: Not on file   Number of children: Not on file   Years of education: Not on file   Highest education level: Not on file  Occupational History   Not on file  Tobacco Use   Smoking status: Never   Smokeless tobacco: Never  Vaping Use   Vaping status: Never Used  Substance and Sexual Activity   Alcohol use: Yes    Alcohol/week: 1.0 standard drink of alcohol    Types: 1 Cans of beer per week   Drug use: No   Sexual activity: Never  Other Topics Concern   Not on file  Social History Narrative   Not on file   Social  Drivers of Health   Financial Resource Strain: Not on file  Food Insecurity: Not on file  Transportation Needs: Not on file  Physical Activity: Not on file  Stress: Not on file  Social Connections: Not on file   Family History  Problem Relation Age of Onset   Thyroid  disease Mother    Hypothyroidism Mother    Osteoarthritis Mother    Fibromyalgia Mother    Restless legs syndrome Mother    Thyroid  cancer Mother 76       Papillary   Melanoma Mother 77   Colon polyps Mother        cscope every 3 years   Migraines Mother    Colon polyps Father    Colon cancer Maternal Grandmother 28   Kidney cancer Maternal Grandfather 68   Dementia Paternal Grandmother    Throat cancer Paternal Grandfather    Melanoma Paternal Grandfather    Breast cancer Other    Allergies  Allergen Reactions   Betadine [Povidone-Iodine] Hives   Clarithromycin Anxiety, Nausea Only and Shortness Of Breath   Amphetamine-Dextroamphetamine     Other reaction(s): nausea   Cefaclor     REACTION: Hives Other reaction(s): hives  Food     Multiple Food Allergies: bell Peppers, Garlic, Shellfish, Pine Nuts, Peanuts, Soy, Melon, Watermelon, Honeydew, Cantaloupe, Squash, Pumpkin, Gourd, Zucchini   Meloxicam     Other reaction(s): migraine and hives   Peanuts [Peanut Oil]    Prednisone     Other reaction(s): tachycardia   Soy Allergy (Obsolete)    Coconut (Cocos Nucifera) Dermatitis, Hives and Rash   Latex Hives and Rash   Current Outpatient Medications  Medication Sig Dispense Refill   acetaminophen  (TYLENOL ) 500 MG tablet Take 1 tablet (500 mg total) by mouth every 8 (eight) hours for 10 days. 30 tablet 0   aspirin  EC 325 MG tablet Take 1 tablet (325 mg total) by mouth daily. 14 tablet 0   HYDROmorphone  (DILAUDID ) 2 MG tablet Take 1 tablet (2 mg total) by mouth every 4 (four) hours as needed for severe pain (pain score 7-10). 15 tablet 0   ibuprofen  (ADVIL ) 800 MG tablet Take 1 tablet (800 mg total) by  mouth every 8 (eight) hours for 10 days. Please take with food, please alternate with acetaminophen  30 tablet 0   ondansetron  (ZOFRAN -ODT) 8 MG disintegrating tablet Take 1 tablet (8 mg total) by mouth every 8 (eight) hours as needed for nausea or vomiting. (Patient not taking: Reported on 08/21/2023) 15 tablet 0   scopolamine  (TRANSDERM-SCOP) 1 MG/3DAYS Place 1 patch (1 mg total) onto the skin every 3 (three) days. 5 patch 12   albuterol  (ACCUNEB ) 0.63 MG/3ML nebulizer solution Take 1 ampule by nebulization every 6 (six) hours as needed for Wheezing.     aspirin  EC 325 MG tablet Take 1 tablet (325 mg total) by mouth daily. 14 tablet 0   Atogepant  (QULIPTA ) 60 MG TABS Take 1 tablet (60 mg total) by mouth daily. 30 tablet 11   azelastine (ASTELIN) 0.1 % nasal spray Place into both nostrils.     Baclofen 20 MG/20ML SOLN 2 (two) times daily as needed.     budesonide-formoterol (SYMBICORT) 160-4.5 MCG/ACT inhaler USE 2 PUFFS TWICE A DAY     celecoxib  (CELEBREX ) 100 MG capsule Take 1 capsule (100 mg total) by mouth 2 (two) times daily. 60 capsule 2   EPINEPHrine  0.3 mg/0.3 mL IJ SOAJ injection See admin instructions.  0   fexofenadine (ALLEGRA) 180 MG tablet Take 180 mg by mouth in the morning and at bedtime.     FLUoxetine (PROZAC) 20 MG capsule Take 20 mg by mouth every morning.  2   HYDROmorphone  (DILAUDID ) 2 MG tablet Take 1 tablet (2 mg total) by mouth every 4 (four) hours as needed for severe pain (pain score 7-10). (Patient not taking: Reported on 08/21/2023) 30 tablet 0   HYDROmorphone  (DILAUDID ) 2 MG tablet Take 1 tablet (2 mg total) by mouth every 4 (four) hours as needed for severe pain (pain score 7-10). (Patient not taking: Reported on 08/21/2023) 20 tablet 0   montelukast (SINGULAIR) 10 MG tablet Take 10 mg by mouth at bedtime.     Multiple Vitamin (MULTIVITAMIN) capsule Take 1 capsule by mouth daily.     omeprazole (PRILOSEC) 20 MG capsule Take 20 mg by mouth daily.     scopolamine   (TRANSDERM-SCOP) 1 MG/3DAYS Place 1 patch (1.5 mg total) onto the skin every 3 (three) days. (Patient not taking: Reported on 08/21/2023) 10 patch 0   SPIRIVA RESPIMAT 1.25 MCG/ACT AERS SMARTSIG:2 Puff(s) Via Inhaler Daily     triamcinolone  (NASACORT ) 55 MCG/ACT AERO nasal inhaler Place 2 sprays into the nose daily.  UBRELVY  100 MG TABS Take 100 mg by mouth if needed as close to onset of migraine as possible. May repeat in 2 hours if headache persists or returns. Do not exceed 2 tablets in 24 hours. 10 tablet 11   No current facility-administered medications for this visit.   No results found.  Review of Systems:   A ROS was performed including pertinent positives and negatives as documented in the HPI.   Musculoskeletal Exam:      Shoulder with range of motion is to 165 degrees degrees with external rotation at the side to 80 degrees.  Internal rotation is to T1.  Negative sulcus on the right   Bilateral patellofemoral joint with significant 4+ laxity with the knee in full extension.  Otherwise range of motion is from 0 to 120 degrees.  There is positive patellar crepitus with grind bilaterally  Imaging:    MRI right knee, MRI left knee: Bilateral elevated TT-TG distance of 15 mm with lateral patellar subluxation with preserved patellofemoral space and atrophy and thinning of the MPFL bilaterally  I personally reviewed and interpreted the radiographs.   Assessment:   Status post right shoulder capsulorrhaphy doing extremely well.  Right shoulder is now doing extremely well.  I did also discuss that her MRIs of bilateral knees do show evidence of an elevated TT-TG distance of 15 consistent with her known history of patella subluxation of which she has more recently had an occurrence on the right side.  Given her persistent patellar instability with multiple subluxations daily despite continued physical therapy and strengthening of the right leg we did continue to discuss MPFL  reconstruction with active brace placement.  I do believe that ultimately she would do extremely well with this.  She has also trialed over-the-counter brace usage without effect.  I did discuss the risks and limitations as well as associated recovery.  After discussion she would like to proceed  Plan :    - Plan for right knee medial patellofemoral ligament reconstruction with active brace placement   After a lengthy discussion of treatment options, including risks, benefits, alternatives, complications of surgical and nonsurgical conservative options, the patient elected surgical repair.   The patient  is aware of the material risks  and complications including, but not limited to injury to adjacent structures, neurovascular injury, infection, numbness, bleeding, implant failure, thermal burns, stiffness, persistent pain, failure to heal, disease transmission from allograft, need for further surgery, dislocation, anesthetic risks, blood clots, risks of death,and others. The probabilities of surgical success and failure discussed with patient given their particular co-morbidities.The time and nature of expected rehabilitation and recovery was discussed.The patient's questions were all answered preoperatively.  No barriers to understanding were noted. I explained the natural history of the disease process and Rx rationale.  I explained to the patient what I considered to be reasonable expectations given their personal situation.  The final treatment plan was arrived at through a shared patient decision making process model.        I personally saw and evaluated the patient, and participated in the management and treatment plan.  Elspeth Parker, MD Attending Physician, Orthopedic Surgery  This document was dictated using Dragon voice recognition software. A reasonable attempt at proof reading has been made to minimize errors.

## 2023-12-11 NOTE — Addendum Note (Signed)
 Addended by: WOLFGANG CONLEY HERO on: 12/11/2023 09:55 AM   Modules accepted: Orders

## 2023-12-12 DIAGNOSIS — M25411 Effusion, right shoulder: Secondary | ICD-10-CM | POA: Diagnosis not present

## 2023-12-12 DIAGNOSIS — M25611 Stiffness of right shoulder, not elsewhere classified: Secondary | ICD-10-CM | POA: Diagnosis not present

## 2023-12-12 DIAGNOSIS — M25511 Pain in right shoulder: Secondary | ICD-10-CM | POA: Diagnosis not present

## 2023-12-12 DIAGNOSIS — Z4789 Encounter for other orthopedic aftercare: Secondary | ICD-10-CM | POA: Diagnosis not present

## 2023-12-13 DIAGNOSIS — J3081 Allergic rhinitis due to animal (cat) (dog) hair and dander: Secondary | ICD-10-CM | POA: Diagnosis not present

## 2023-12-13 DIAGNOSIS — J301 Allergic rhinitis due to pollen: Secondary | ICD-10-CM | POA: Diagnosis not present

## 2023-12-13 DIAGNOSIS — J3089 Other allergic rhinitis: Secondary | ICD-10-CM | POA: Diagnosis not present

## 2023-12-17 DIAGNOSIS — Z4789 Encounter for other orthopedic aftercare: Secondary | ICD-10-CM | POA: Diagnosis not present

## 2023-12-17 DIAGNOSIS — M25411 Effusion, right shoulder: Secondary | ICD-10-CM | POA: Diagnosis not present

## 2023-12-17 DIAGNOSIS — M25511 Pain in right shoulder: Secondary | ICD-10-CM | POA: Diagnosis not present

## 2023-12-17 DIAGNOSIS — M25611 Stiffness of right shoulder, not elsewhere classified: Secondary | ICD-10-CM | POA: Diagnosis not present

## 2023-12-19 DIAGNOSIS — M25411 Effusion, right shoulder: Secondary | ICD-10-CM | POA: Diagnosis not present

## 2023-12-19 DIAGNOSIS — M25611 Stiffness of right shoulder, not elsewhere classified: Secondary | ICD-10-CM | POA: Diagnosis not present

## 2023-12-19 DIAGNOSIS — Z4789 Encounter for other orthopedic aftercare: Secondary | ICD-10-CM | POA: Diagnosis not present

## 2023-12-19 DIAGNOSIS — M25511 Pain in right shoulder: Secondary | ICD-10-CM | POA: Diagnosis not present

## 2023-12-20 DIAGNOSIS — J3089 Other allergic rhinitis: Secondary | ICD-10-CM | POA: Diagnosis not present

## 2023-12-20 DIAGNOSIS — J3081 Allergic rhinitis due to animal (cat) (dog) hair and dander: Secondary | ICD-10-CM | POA: Diagnosis not present

## 2023-12-20 DIAGNOSIS — J301 Allergic rhinitis due to pollen: Secondary | ICD-10-CM | POA: Diagnosis not present

## 2023-12-22 ENCOUNTER — Encounter (HOSPITAL_BASED_OUTPATIENT_CLINIC_OR_DEPARTMENT_OTHER): Payer: Self-pay | Admitting: Orthopaedic Surgery

## 2023-12-23 ENCOUNTER — Other Ambulatory Visit (HOSPITAL_BASED_OUTPATIENT_CLINIC_OR_DEPARTMENT_OTHER): Payer: Self-pay | Admitting: Orthopaedic Surgery

## 2023-12-23 DIAGNOSIS — M25361 Other instability, right knee: Secondary | ICD-10-CM

## 2023-12-24 DIAGNOSIS — Z4789 Encounter for other orthopedic aftercare: Secondary | ICD-10-CM | POA: Diagnosis not present

## 2023-12-24 DIAGNOSIS — M25511 Pain in right shoulder: Secondary | ICD-10-CM | POA: Diagnosis not present

## 2023-12-24 DIAGNOSIS — M25611 Stiffness of right shoulder, not elsewhere classified: Secondary | ICD-10-CM | POA: Diagnosis not present

## 2023-12-24 DIAGNOSIS — M25411 Effusion, right shoulder: Secondary | ICD-10-CM | POA: Diagnosis not present

## 2023-12-26 DIAGNOSIS — Z4789 Encounter for other orthopedic aftercare: Secondary | ICD-10-CM | POA: Diagnosis not present

## 2023-12-26 DIAGNOSIS — M25411 Effusion, right shoulder: Secondary | ICD-10-CM | POA: Diagnosis not present

## 2023-12-26 DIAGNOSIS — M25511 Pain in right shoulder: Secondary | ICD-10-CM | POA: Diagnosis not present

## 2023-12-26 DIAGNOSIS — M25611 Stiffness of right shoulder, not elsewhere classified: Secondary | ICD-10-CM | POA: Diagnosis not present

## 2023-12-31 DIAGNOSIS — M25411 Effusion, right shoulder: Secondary | ICD-10-CM | POA: Diagnosis not present

## 2023-12-31 DIAGNOSIS — M25611 Stiffness of right shoulder, not elsewhere classified: Secondary | ICD-10-CM | POA: Diagnosis not present

## 2023-12-31 DIAGNOSIS — M25511 Pain in right shoulder: Secondary | ICD-10-CM | POA: Diagnosis not present

## 2023-12-31 DIAGNOSIS — Z4789 Encounter for other orthopedic aftercare: Secondary | ICD-10-CM | POA: Diagnosis not present

## 2024-01-02 DIAGNOSIS — M25411 Effusion, right shoulder: Secondary | ICD-10-CM | POA: Diagnosis not present

## 2024-01-02 DIAGNOSIS — M25511 Pain in right shoulder: Secondary | ICD-10-CM | POA: Diagnosis not present

## 2024-01-02 DIAGNOSIS — M25611 Stiffness of right shoulder, not elsewhere classified: Secondary | ICD-10-CM | POA: Diagnosis not present

## 2024-01-02 DIAGNOSIS — Z4789 Encounter for other orthopedic aftercare: Secondary | ICD-10-CM | POA: Diagnosis not present

## 2024-01-06 ENCOUNTER — Encounter (HOSPITAL_BASED_OUTPATIENT_CLINIC_OR_DEPARTMENT_OTHER): Payer: Self-pay | Admitting: Orthopaedic Surgery

## 2024-01-06 NOTE — Telephone Encounter (Signed)
 Amy any update on Auth?

## 2024-01-08 DIAGNOSIS — J4540 Moderate persistent asthma, uncomplicated: Secondary | ICD-10-CM | POA: Diagnosis not present

## 2024-01-09 DIAGNOSIS — M25411 Effusion, right shoulder: Secondary | ICD-10-CM | POA: Diagnosis not present

## 2024-01-09 DIAGNOSIS — Z4789 Encounter for other orthopedic aftercare: Secondary | ICD-10-CM | POA: Diagnosis not present

## 2024-01-09 DIAGNOSIS — M25611 Stiffness of right shoulder, not elsewhere classified: Secondary | ICD-10-CM | POA: Diagnosis not present

## 2024-01-09 DIAGNOSIS — M25511 Pain in right shoulder: Secondary | ICD-10-CM | POA: Diagnosis not present

## 2024-01-13 ENCOUNTER — Other Ambulatory Visit: Payer: Self-pay

## 2024-01-13 ENCOUNTER — Encounter (HOSPITAL_BASED_OUTPATIENT_CLINIC_OR_DEPARTMENT_OTHER): Payer: Self-pay | Admitting: Orthopaedic Surgery

## 2024-01-14 ENCOUNTER — Encounter (HOSPITAL_BASED_OUTPATIENT_CLINIC_OR_DEPARTMENT_OTHER): Payer: Self-pay | Admitting: Orthopaedic Surgery

## 2024-01-14 DIAGNOSIS — Z4789 Encounter for other orthopedic aftercare: Secondary | ICD-10-CM | POA: Diagnosis not present

## 2024-01-14 DIAGNOSIS — M25611 Stiffness of right shoulder, not elsewhere classified: Secondary | ICD-10-CM | POA: Diagnosis not present

## 2024-01-14 DIAGNOSIS — M25411 Effusion, right shoulder: Secondary | ICD-10-CM | POA: Diagnosis not present

## 2024-01-14 DIAGNOSIS — M25511 Pain in right shoulder: Secondary | ICD-10-CM | POA: Diagnosis not present

## 2024-01-15 NOTE — Progress Notes (Signed)

## 2024-01-16 DIAGNOSIS — M25611 Stiffness of right shoulder, not elsewhere classified: Secondary | ICD-10-CM | POA: Diagnosis not present

## 2024-01-16 DIAGNOSIS — M25511 Pain in right shoulder: Secondary | ICD-10-CM | POA: Diagnosis not present

## 2024-01-16 DIAGNOSIS — M25411 Effusion, right shoulder: Secondary | ICD-10-CM | POA: Diagnosis not present

## 2024-01-16 DIAGNOSIS — Z4789 Encounter for other orthopedic aftercare: Secondary | ICD-10-CM | POA: Diagnosis not present

## 2024-01-17 DIAGNOSIS — J3089 Other allergic rhinitis: Secondary | ICD-10-CM | POA: Diagnosis not present

## 2024-01-17 DIAGNOSIS — J301 Allergic rhinitis due to pollen: Secondary | ICD-10-CM | POA: Diagnosis not present

## 2024-01-17 DIAGNOSIS — J3081 Allergic rhinitis due to animal (cat) (dog) hair and dander: Secondary | ICD-10-CM | POA: Diagnosis not present

## 2024-01-20 ENCOUNTER — Ambulatory Visit (HOSPITAL_BASED_OUTPATIENT_CLINIC_OR_DEPARTMENT_OTHER)

## 2024-01-20 ENCOUNTER — Encounter (HOSPITAL_BASED_OUTPATIENT_CLINIC_OR_DEPARTMENT_OTHER): Payer: Self-pay | Admitting: Orthopaedic Surgery

## 2024-01-20 ENCOUNTER — Other Ambulatory Visit: Payer: Self-pay

## 2024-01-20 ENCOUNTER — Ambulatory Visit (HOSPITAL_BASED_OUTPATIENT_CLINIC_OR_DEPARTMENT_OTHER): Payer: Self-pay | Admitting: Anesthesiology

## 2024-01-20 ENCOUNTER — Ambulatory Visit (HOSPITAL_BASED_OUTPATIENT_CLINIC_OR_DEPARTMENT_OTHER)
Admission: RE | Admit: 2024-01-20 | Discharge: 2024-01-20 | Disposition: A | Discharge status: Home / Self Care | Attending: Orthopaedic Surgery | Admitting: Orthopaedic Surgery

## 2024-01-20 ENCOUNTER — Encounter: Payer: Self-pay | Admitting: Radiology

## 2024-01-20 ENCOUNTER — Encounter (HOSPITAL_BASED_OUTPATIENT_CLINIC_OR_DEPARTMENT_OTHER)
Admission: RE | Disposition: A | Discharge status: Home / Self Care | Payer: Self-pay | Source: Home / Self Care | Attending: Orthopaedic Surgery

## 2024-01-20 DIAGNOSIS — M1990 Unspecified osteoarthritis, unspecified site: Secondary | ICD-10-CM | POA: Diagnosis not present

## 2024-01-20 DIAGNOSIS — Z01818 Encounter for other preprocedural examination: Secondary | ICD-10-CM

## 2024-01-20 DIAGNOSIS — M25361 Other instability, right knee: Secondary | ICD-10-CM

## 2024-01-20 DIAGNOSIS — Z8261 Family history of arthritis: Secondary | ICD-10-CM | POA: Insufficient documentation

## 2024-01-20 DIAGNOSIS — K900 Celiac disease: Secondary | ICD-10-CM | POA: Diagnosis not present

## 2024-01-20 DIAGNOSIS — Z79899 Other long term (current) drug therapy: Secondary | ICD-10-CM | POA: Insufficient documentation

## 2024-01-20 DIAGNOSIS — M2351 Chronic instability of knee, right knee: Secondary | ICD-10-CM | POA: Insufficient documentation

## 2024-01-20 DIAGNOSIS — K219 Gastro-esophageal reflux disease without esophagitis: Secondary | ICD-10-CM | POA: Diagnosis not present

## 2024-01-20 DIAGNOSIS — F418 Other specified anxiety disorders: Secondary | ICD-10-CM | POA: Diagnosis not present

## 2024-01-20 DIAGNOSIS — J45909 Unspecified asthma, uncomplicated: Secondary | ICD-10-CM | POA: Insufficient documentation

## 2024-01-20 DIAGNOSIS — G8918 Other acute postprocedural pain: Secondary | ICD-10-CM | POA: Diagnosis not present

## 2024-01-20 HISTORY — DX: Nausea with vomiting, unspecified: R11.2

## 2024-01-20 HISTORY — PX: MEDIAL PATELLOFEMORAL LIGAMENT REPAIR: SHX2020

## 2024-01-20 LAB — POCT PREGNANCY, URINE: Preg Test, Ur: NEGATIVE

## 2024-01-20 SURGERY — RECONSTRUCTION, LIGAMENT, MEDIAL PATELLOFEMORAL
Anesthesia: General | Site: Knee | Laterality: Right

## 2024-01-20 MED ORDER — CEFAZOLIN SODIUM-DEXTROSE 2-4 GM/100ML-% IV SOLN
INTRAVENOUS | Status: AC
  Filled 2024-01-20: qty 100

## 2024-01-20 MED ORDER — GABAPENTIN 300 MG PO CAPS
ORAL_CAPSULE | ORAL | Status: AC
  Filled 2024-01-20: qty 1

## 2024-01-20 MED ORDER — FENTANYL CITRATE (PF) 100 MCG/2ML IJ SOLN
INTRAMUSCULAR | Status: AC
  Filled 2024-01-20: qty 2

## 2024-01-20 MED ORDER — FENTANYL CITRATE (PF) 100 MCG/2ML IJ SOLN
INTRAMUSCULAR | Status: DC | PRN
  Administered 2024-01-20 (×3): 50 ug via INTRAVENOUS

## 2024-01-20 MED ORDER — PROPOFOL 500 MG/50ML IV EMUL
INTRAVENOUS | Status: DC | PRN
  Administered 2024-01-20: 150 ug/kg/min via INTRAVENOUS

## 2024-01-20 MED ORDER — PROPOFOL 10 MG/ML IV BOLUS
INTRAVENOUS | Status: DC | PRN
  Administered 2024-01-20: 50 mg via INTRAVENOUS
  Administered 2024-01-20: 150 mg via INTRAVENOUS
  Administered 2024-01-20: 50 mg via INTRAVENOUS

## 2024-01-20 MED ORDER — OXYCODONE HCL 5 MG/5ML PO SOLN: 5.0000 mg | Freq: Once | ORAL | Status: DC | PRN

## 2024-01-20 MED ORDER — SCOPOLAMINE 1 MG/3DAYS TD PT72
1.0000 | MEDICATED_PATCH | TRANSDERMAL | Status: DC
  Administered 2024-01-20: 1 mg via TRANSDERMAL

## 2024-01-20 MED ORDER — TRAMADOL HCL 50 MG PO TABS
ORAL_TABLET | ORAL | Status: AC
  Filled 2024-01-20: qty 1

## 2024-01-20 MED ORDER — MIDAZOLAM HCL 5 MG/5ML IJ SOLN
INTRAMUSCULAR | Status: DC | PRN
  Administered 2024-01-20: 2 mg via INTRAVENOUS

## 2024-01-20 MED ORDER — PROPOFOL 10 MG/ML IV BOLUS
INTRAVENOUS | Status: AC
  Filled 2024-01-20: qty 20

## 2024-01-20 MED ORDER — CEFAZOLIN SODIUM-DEXTROSE 2-4 GM/100ML-% IV SOLN
2.0000 g | INTRAVENOUS | Status: AC
  Administered 2024-01-20: 2 g via INTRAVENOUS

## 2024-01-20 MED ORDER — ACETAMINOPHEN 500 MG PO TABS
ORAL_TABLET | ORAL | Status: AC
  Filled 2024-01-20: qty 2

## 2024-01-20 MED ORDER — LIDOCAINE HCL (CARDIAC) PF 100 MG/5ML IV SOSY
PREFILLED_SYRINGE | INTRAVENOUS | Status: DC | PRN
  Administered 2024-01-20: 80 mg via INTRAVENOUS

## 2024-01-20 MED ORDER — TRANEXAMIC ACID-NACL 1000-0.7 MG/100ML-% IV SOLN
INTRAVENOUS | Status: AC
  Filled 2024-01-20: qty 100

## 2024-01-20 MED ORDER — TRANEXAMIC ACID-NACL 1000-0.7 MG/100ML-% IV SOLN
1000.0000 mg | INTRAVENOUS | Status: AC
  Administered 2024-01-20: 1000 mg via INTRAVENOUS

## 2024-01-20 MED ORDER — ACETAMINOPHEN 500 MG PO TABS
1000.0000 mg | ORAL_TABLET | Freq: Once | ORAL | Status: AC
  Administered 2024-01-20: 1000 mg via ORAL

## 2024-01-20 MED ORDER — ONDANSETRON HCL 4 MG/2ML IJ SOLN
INTRAMUSCULAR | Status: DC | PRN
  Administered 2024-01-20: 4 mg via INTRAVENOUS

## 2024-01-20 MED ORDER — TRAMADOL HCL 50 MG PO TABS
50.0000 mg | ORAL_TABLET | Freq: Once | ORAL | Status: AC
Start: 2024-01-20 — End: 2024-01-20
  Administered 2024-01-20: 50 mg via ORAL

## 2024-01-20 MED ORDER — GABAPENTIN 300 MG PO CAPS
300.0000 mg | ORAL_CAPSULE | Freq: Once | ORAL | Status: AC
  Administered 2024-01-20: 300 mg via ORAL

## 2024-01-20 MED ORDER — MIDAZOLAM HCL 2 MG/2ML IJ SOLN
INTRAMUSCULAR | Status: AC
  Filled 2024-01-20: qty 2

## 2024-01-20 MED ORDER — ONDANSETRON HCL 4 MG/2ML IJ SOLN
INTRAMUSCULAR | Status: AC
  Filled 2024-01-20: qty 2

## 2024-01-20 MED ORDER — ACETAMINOPHEN 500 MG PO TABS: 1000.0000 mg | ORAL_TABLET | Freq: Once | ORAL | Status: DC

## 2024-01-20 MED ORDER — FENTANYL CITRATE (PF) 100 MCG/2ML IJ SOLN
100.0000 ug | Freq: Once | INTRAMUSCULAR | Status: AC
  Administered 2024-01-20: 50 ug via INTRAVENOUS

## 2024-01-20 MED ORDER — ROPIVACAINE HCL 5 MG/ML IJ SOLN
INTRAMUSCULAR | Status: DC | PRN
  Administered 2024-01-20: 20 mL via PERINEURAL

## 2024-01-20 MED ORDER — LACTATED RINGERS IV SOLN: INTRAVENOUS | Status: DC

## 2024-01-20 MED ORDER — HYDROMORPHONE HCL 1 MG/ML IJ SOLN: 0.2500 mg | INTRAMUSCULAR | Status: DC | PRN

## 2024-01-20 MED ORDER — SCOPOLAMINE 1 MG/3DAYS TD PT72
MEDICATED_PATCH | TRANSDERMAL | Status: AC
  Filled 2024-01-20: qty 1

## 2024-01-20 MED ORDER — AMISULPRIDE (ANTIEMETIC) 5 MG/2ML IV SOLN: 10.0000 mg | Freq: Once | INTRAVENOUS | Status: DC | PRN

## 2024-01-20 MED ORDER — OXYCODONE HCL 5 MG PO TABS: 5.0000 mg | ORAL_TABLET | Freq: Once | ORAL | Status: DC | PRN

## 2024-01-20 MED ORDER — MIDAZOLAM HCL (PF) 2 MG/2ML IJ SOLN
2.0000 mg | Freq: Once | INTRAMUSCULAR | Status: AC
  Administered 2024-01-20: 2 mg via INTRAVENOUS

## 2024-01-20 MED ORDER — DEXAMETHASONE SOD PHOSPHATE PF 10 MG/ML IJ SOLN
INTRAMUSCULAR | Status: DC | PRN
Start: 2024-01-20 — End: 2024-01-20
  Administered 2024-01-20: 10 mg via INTRAVENOUS

## 2024-01-20 SURGICAL SUPPLY — 40 items
ANCH JUGGERLOOP OC 2.9 MB SUT (Anchor) IMPLANT
ANCHOR SUT QUATTRO KNTLS 4.5 (Anchor) IMPLANT
BLADE SURG 15 STRL LF DISP TIS (BLADE) ×1 IMPLANT
BNDG ELASTIC 4INX 5YD STR LF (GAUZE/BANDAGES/DRESSINGS) ×1 IMPLANT
BNDG ELASTIC 6INX 5YD STR LF (GAUZE/BANDAGES/DRESSINGS) ×1 IMPLANT
CHLORAPREP W/TINT 26 (MISCELLANEOUS) ×1 IMPLANT
COOLER ICEMAN CLASSIC (MISCELLANEOUS) ×1 IMPLANT
DRAPE C-ARM 42X72 X-RAY (DRAPES) IMPLANT
DRAPE IMP U-DRAPE 54X76 (DRAPES) ×1 IMPLANT
DRAPE U-SHAPE 47X51 STRL (DRAPES) ×1 IMPLANT
ELECTRODE REM PT RTRN 9FT ADLT (ELECTROSURGICAL) ×1 IMPLANT
GAUZE PAD ABD 8X10 STRL (GAUZE/BANDAGES/DRESSINGS) ×1 IMPLANT
GAUZE SPONGE 4X4 12PLY STRL (GAUZE/BANDAGES/DRESSINGS) ×1 IMPLANT
GAUZE XEROFORM 1X8 LF (GAUZE/BANDAGES/DRESSINGS) ×1 IMPLANT
GLOVE BIOGEL PI IND STRL 6.5 (GLOVE) ×1 IMPLANT
GLOVE BIOGEL PI IND STRL 8 (GLOVE) ×1 IMPLANT
GOWN STRL REUS W/ TWL LRG LVL3 (GOWN DISPOSABLE) ×1 IMPLANT
GOWN STRL REUS W/ TWL XL LVL3 (GOWN DISPOSABLE) ×1 IMPLANT
GRAFT TISS 230-320 GRACILIS (Bone Implant) IMPLANT
KIT JUGGERKNOT DISP 2.9MM (KITS) IMPLANT
PACK BASIN DAY SURGERY FS (CUSTOM PROCEDURE TRAY) ×1 IMPLANT
PAD COLD SHLDR WRAP-ON (PAD) ×1 IMPLANT
PADDING CAST COTTON 6X4 STRL (CAST SUPPLIES) IMPLANT
PENCIL SMOKE EVACUATOR (MISCELLANEOUS) ×1 IMPLANT
SLEEVE SCD COMPRESS KNEE MED (STOCKING) ×1 IMPLANT
SPONGE T-LAP 18X18 ~~LOC~~+RFID (SPONGE) ×1 IMPLANT
SUCTION TUBE FRAZIER 10FR DISP (SUCTIONS) ×1 IMPLANT
SUT ETHILON 3 0 PS 1 (SUTURE) ×1 IMPLANT
SUT MNCRL AB 3-0 PS2 27 (SUTURE) ×1 IMPLANT
SUT TAPE ACTIVBRAID 1.5 BL (SUTURE) IMPLANT
SUT VIC AB 0 CT1 27XBRD ANBCTR (SUTURE) IMPLANT
SUT VIC AB 2-0 CT1 TAPERPNT 27 (SUTURE) IMPLANT
SUTR EXPRESS BRAID GRAFT MANIP (SUTURE) IMPLANT
SUTURE BB 1.5X25 CL #2 NDL (SUTURE) IMPLANT
SUTURE BB 1.5X25 CL #2 NEEDLE (SUTURE) IMPLANT
SUTURE BROADBAND MINI LP BLACK (SUTURE) IMPLANT
SUTURE FIBERWR #2 38 T-5 BLUE (SUTURE) ×1 IMPLANT
SUTURE TAPE 1.3 FIBERLOP 20 ST (SUTURE) ×1 IMPLANT
TOWEL GREEN STERILE FF (TOWEL DISPOSABLE) ×2 IMPLANT
YANKAUER SUCT BULB TIP NO VENT (SUCTIONS) ×1 IMPLANT

## 2024-01-20 NOTE — Transfer of Care (Signed)
 Immediate Anesthesia Transfer of Care Note  Patient: Pamela Norton  Procedure(s) Performed: RECONSTRUCTION, LIGAMENT, MEDIAL PATELLOFEMORAL (Right: Knee)  Patient Location: PACU  Anesthesia Type:GA combined with regional for post-op pain  Level of Consciousness: drowsy and patient cooperative  Airway & Oxygen Therapy: Patient Spontanous Breathing and Patient connected to face mask oxygen  Post-op Assessment: Report given to RN and Post -op Vital signs reviewed and stable  Post vital signs: Reviewed and stable  Last Vitals:  Vitals Value Taken Time  BP 111/70 01/20/24 10:58  Temp    Pulse 100 01/20/24 11:00  Resp 9 01/20/24 11:00  SpO2 100 % 01/20/24 11:00  Vitals shown include unfiled device data.  Last Pain:  Vitals:   01/20/24 0751  PainSc: 2       Patients Stated Pain Goal: 7 (01/20/24 0751)  Complications: No notable events documented.

## 2024-01-20 NOTE — Anesthesia Postprocedure Evaluation (Signed)
 Anesthesia Post Note  Patient: Pamela Norton  Procedure(s) Performed: RECONSTRUCTION, LIGAMENT, MEDIAL PATELLOFEMORAL (Right: Knee)     Patient location during evaluation: PACU Anesthesia Type: General Level of consciousness: awake Pain management: pain level controlled Vital Signs Assessment: post-procedure vital signs reviewed and stable Respiratory status: spontaneous breathing, nonlabored ventilation and respiratory function stable Cardiovascular status: blood pressure returned to baseline and stable Postop Assessment: no apparent nausea or vomiting Anesthetic complications: no   No notable events documented.  Last Vitals:  Vitals:   01/20/24 1115 01/20/24 1130  BP: 109/74 123/81  Pulse: 96 (!) 113  Resp: 14 (!) 22  Temp:    SpO2: 100% 98%    Last Pain:  Vitals:   01/20/24 1130  PainSc: 3         RLE Motor Response: Purposeful movement (01/20/24 1130) RLE Sensation: Full sensation (01/20/24 1130)      Delon Aisha Arch

## 2024-01-20 NOTE — Discharge Instructions (Addendum)
 Discharge Instructions    Attending Surgeon: Elspeth Parker, MD Office Phone Number: [phone]   Diagnosis and Procedures:    Surgeries Performed: Right knee medial patellofemoral ligament reconstruction  Discharge Plan:    Diet: Resume usual diet. Begin with light or bland foods.  Drink plenty of fluids.  Activity:  Weight bearing as tolerated in brace. (Once block has worn off completely.) You are advised to go home directly from the hospital or surgical center. Restrict your activities.  GENERAL INSTRUCTIONS: 1.  Please apply ice to your wound to help with swelling and inflammation. This will improve your comfort and your overall recovery following surgery.     2. Please call Dr. Danetta office at [phone] with questions Monday-Friday during business hours. If no one answers, please leave a message and someone should get back to the patient within 24 hours. For emergencies please call 911 or proceed to the emergency room.   3. Patient to notify surgical team if experiences any of the following: Bowel/Bladder dysfunction, uncontrolled pain, nerve/muscle weakness, incision with increased drainage or redness, nausea/vomiting and Fever greater than 101.0 F.  Be alert for signs of infection including redness, streaking, odor, fever or chills. Be alert for excessive pain or bleeding and notify your surgeon immediately.  WOUND INSTRUCTIONS:   Leave your dressing, cast, or splint in place until your post operative visit.  Keep it clean and dry.  Always keep the incision clean and dry until the staples/sutures are removed. If there is no drainage from the incision you should keep it open to air. If there is drainage from the incision you must keep it covered at all times until the drainage stops  Do not soak in a bath tub, hot tub, pool, lake or other body of water until 21 days after your surgery and your incision is completely dry and healed.  If you have removable  sutures (or staples) they must be removed 10-14 days (unless otherwise instructed) from the day of your surgery.     1)  Elevate the extremity as much as possible.  2)  Keep the dressing clean and dry.  3)  Please call us  if the dressing becomes wet or dirty.  4)  If you are experiencing worsening pain or worsening swelling, please call.     MEDICATIONS: Resume all previous home medications at the previous prescribed dose and frequency unless otherwise noted Start taking the  pain medications on an as-needed basis as prescribed  Please taper down pain medication over the next week following surgery.  Ideally you should not require a refill of any narcotic pain medication.  Take pain medication with food to minimize nausea. In addition to the prescribed pain medication, you may take over-the-counter pain relievers such as Tylenol .  Do NOT take additional tylenol  if your pain medication already has tylenol  in it.  Aspirin  325mg  daily per instructions on bottle. Narcotic policy: Per [facility] clinic policy, our goal is ensure optimal postoperative pain control with a multimodal pain management strategy. For all [facility] patients, our goal is to wean post-operative narcotic medications by 6 weeks post-operatively, and many times sooner. If this is not possible due to utilization of pain medication prior to surgery, your [facility] doctor will support your acute post-operative pain control for the first 6 weeks postoperatively, with a plan to transition you back to your primary pain team following that. Maralee will work to ensure a therapist, occupational.       FOLLOWUP  INSTRUCTIONS: 1. Follow up at the Physical Therapy Clinic 3-4 days following surgery. This appointment should be scheduled unless other arrangements have been made.The Physical Therapy scheduling number is [phone] if an appointment has not already been arranged.  2. Contact Dr. Danetta office during office hours at [phone]  or the practice after hours line at [phone] for non-emergencies. For medical emergencies call 911.   Discharge Location: Home  No tylenol  before 2pm.  Regional Anesthesia Blocks  1. You may not be able to move or feel the blocked extremity after a regional anesthetic block. This may last may last from 3-48 hours after placement, but it will go away. The length of time depends on the medication injected and your individual response to the medication. As the nerves start to wake up, you may experience tingling as the movement and feeling returns to your extremity. If the numbness and inability to move your extremity has not gone away after 48 hours, please call your surgeon.   2. The extremity that is blocked will need to be protected until the numbness is gone and the strength has returned. Because you cannot feel it, you will need to take extra care to avoid injury. Because it may be weak, you may have difficulty moving it or using it. You may not know what position it is in without looking at it while the block is in effect.  3. For blocks in the legs and feet, returning to weight bearing and walking needs to be done carefully. You will need to wait until the numbness is entirely gone and the strength has returned. You should be able to move your leg and foot normally before you try and bear weight or walk. You will need someone to be with you when you first try to ensure you do not fall and possibly risk injury.  4. Bruising and tenderness at the needle site are common side effects and will resolve in a few days.  5. Persistent numbness or new problems with movement should be communicated to the surgeon or the [facility] [phone] [facility] [phone]).  Post Anesthesia Home Care Instructions  Activity: Get plenty of rest for the remainder of the day. A responsible individual must stay with you for 24 hours following the procedure.  For the next 24  hours, DO NOT: -Drive a car -Advertising copywriter -Drink alcoholic beverages -Take any medication unless instructed by your physician -Make any legal decisions or sign important papers.  Meals: Start with liquid foods such as gelatin or soup. Progress to regular foods as tolerated. Avoid greasy, spicy, heavy foods. If nausea and/or vomiting occur, drink only clear liquids until the nausea and/or vomiting subsides. Call your physician if vomiting continues.  Special Instructions/Symptoms: Your throat may feel dry or sore from the anesthesia or the breathing tube placed in your throat during surgery. If this causes discomfort, gargle with warm salt water. The discomfort should disappear within 24 hours.  If you had a scopolamine  patch placed behind your ear for the management of post- operative nausea and/or vomiting:  1. The medication in the patch is effective for 72 hours, after which it should be removed.  Wrap patch in a tissue and discard in the trash. Wash hands thoroughly with soap and water. 2. You may remove the patch earlier than 72 hours if you experience unpleasant side effects which may include dry mouth, dizziness or visual disturbances. 3. Avoid touching the patch. Wash your hands  with soap and water after contact with the patch.

## 2024-01-20 NOTE — Brief Op Note (Signed)
   Brief Op Note  Date of Surgery: 01/20/2024  Preoperative Diagnosis: RIGHT PATELLAR INSTABILITY  Postoperative Diagnosis: same  Procedure: Procedure(s): RECONSTRUCTION, LIGAMENT, MEDIAL PATELLOFEMORAL  Implants: Implant Name Type Inv. Item Serial No. Manufacturer Lot No. LRB No. Used Action  ANCH JUGGERLOOP OC 2.9 MB SUT - [id number] Anchor ANCH JUGGERLOOP OC 2.9 MB SUT  [employer](ORTH,TRAU,BIO,SG) [id number] Right 1 Implanted  [employer] [employer] JUGGERLOOP OC 2.9 MB SUT - [id number] Anchor ANCH JUGGERLOOP OC 2.9 MB SUT  [employer]ON(ORTH,TRAU,BIO,SG) [id number] Right 1 Implanted  ANCHOR SUT QUATTRO KNTLS 4.5 - [id number] Anchor ANCHOR SUT QUATTRO KNTLS 4.5  [employer]ON(ORTH,TRAU,BIO,SG) [id number] Right 1 Implanted  GRAFT TISS 230-320 GRACILIS - [id number] Bone Implant GRAFT TISS 230-320 GRACILIS [id number] [facility]  Right 1 Implanted    Surgeons: Surgeon(s): Genelle Standing, MD  Anesthesia: General    Estimated Blood Loss: See anesthesia record  Complications: None  Condition to PACU: Stable  Standing LITTIE Genelle, MD 01/20/2024 11:01 AM

## 2024-01-20 NOTE — Op Note (Signed)
 Date of Surgery: 01/20/2024  INDICATIONS: Ms. Esselman is a 33 y.o.-year-old female with right knee patellar instability.  The risk and benefits of the procedure were discussed in detail and documented in the pre-operative evaluation.   PREOPERATIVE DIAGNOSIS: 1.  Right knee patellar instability  POSTOPERATIVE DIAGNOSIS: Same.  PROCEDURE: 1.  Right knee medial patellofemoral ligament reconstruction with active brace placement  SURGEON: Elspeth LITTIE Parker MD  ASSISTANT: Conley Dawson, ATC  ANESTHESIA:  general  IV FLUIDS AND URINE: See anesthesia record.  ANTIBIOTICS: Ancef   ESTIMATED BLOOD LOSS: 10 mL.  IMPLANTS:  Implant Name Type Inv. Item Serial No. Manufacturer Lot No. LRB No. Used Action  ANCH JUGGERLOOP OC 2.9 MB SUT - [id number] Anchor ANCH JUGGERLOOP OC 2.9 MB SUT  [employer](ORTH,TRAU,BIO,SG) [id number] Right 1 Implanted  [facility] JUGGERLOOP OC 2.9 MB SUT - [id number] Anchor ANCH JUGGERLOOP OC 2.9 MB SUT  [employer](ORTH,TRAU,BIO,SG) [id number] Right 1 Implanted  ANCHOR SUT QUATTRO KNTLS 4.5 - [id number] Anchor ANCHOR SUT QUATTRO KNTLS 4.5  [employer](ORTH,TRAU,BIO,SG) [id number] Right 1 Implanted  GRAFT TISS 230-320 GRACILIS - [id number] Bone Implant GRAFT TISS 230-320 GRACILIS [id number] [facility]  Right 1 Implanted    DRAINS: None  CULTURES: None  COMPLICATIONS: none  DESCRIPTION OF PROCEDURE:  I identified the patient in the pre-operative holding area.  I marked the operative right knee with my initials. I reviewed the risks and benefits of the proposed surgical intervention and the patient (and/or patient's guardian) wished to proceed.  Anesthesia was then performed with regional block.  The patient was transferred to the operative suite and placed in the supine position with all bony prominences padded.     SCDs were placed on the non-operative lower extremity. Appropriate antibiotics was administered within 1 hour before incision. The operative extremity was  then prepped and draped in standard fashion. A time out was performed confirming the correct extremity, correct patient and correct procedure.     A 3 cm incision over the medial aspect of the patella was created and dissection was carried down to layer 1. Full thickness medial and lateral cutaneous flaps were developed.     At this point, a 15 blade was used to dissect layers 1 and 2 off the medial patella, and I then bluntly developed the interval between layers 2 and 3.  This was taken bluntly all the way to the level of the medial epicondyle.  A passing suture was placed in this interval medial epicondylar incision was created   Next, electrocautery was used to remove soft tissue from the medial aspect of the patella from the proximal pole of the equator.  2 jogger loop anchors were then placed after drilling.  Both the graft and 1 strand of active braid was placed through these loops and these were subsequentially tightened.  At this point, loops of the graft as well as the suture were delivered into the medial epicondylar wound.  At this time shuttles point was localized using fluoroscopy.  The ends of the graft were sutured together using a whipstitch technique.  Finally these were placed into shuttles point which was again confirmed first with an awl then with an anchor.  The free limbs of the active braid were then tightened to achieve 1-1/2 quadrants of lateral patellar motion.   The wounds were copiously irrigated. All the incisions were then closed in layers, 0-vicryl for deep layers, 2-0 vicryl for deep dermis, and a running subcuticular 3-0 monocryl. The arthroscopy portals were closed with  3- 0 monocryl. A sterile dressing was applied followed by a Polar Care device and a hinged knee brace locked in extension.   The patient awoke from anesthesia without difficulty and was transferred to PACU in stable condition.       POSTOPERATIVE PLAN: She will be weightbearing as tolerated.  She  will follow the MPFL reconstruction protocol.  I will plan to see her back in 2 weeks for suture removal.  She will be placed on aspirin  during that time  Elspeth LITTIE Parker, MD 11:01 AM

## 2024-01-20 NOTE — H&P (Signed)
 Post Operative Evaluation      Procedure/Date of Surgery: Right shoulder arthroscopy with capsulorrhaphy labral repair 5/13   Interval History:    Presents today for follow up of her right shoulder.  Overall she is continuing to make headway and is doing quite well after a recent glenohumeral injection.  Range of motion is now normalized.  There is some occasional shifting although stability is quite improved compared to the contralateral side     PMH/PSH/Family History/Social History/Meds/Allergies:         Past Medical History:  Diagnosis Date   Anxiety     Asthma     Celiac disease     Complication of anesthesia     Depression     Family history of breast cancer     Family history of colon cancer     Family history of colonic polyps     Family history of melanoma     Family history of thyroid  cancer     Migraines               Past Surgical History:  Procedure Laterality Date   SHOULDER ARTHROSCOPY WITH LABRAL REPAIR Right 07/30/2023    Procedure: ARTHROSCOPY, SHOULDER, WITH GLENOID LABRUM REPAIR;  Surgeon: Genelle Standing, MD;  Location: [facility];  Service: Orthopedics;  Laterality: Right;  RIGHT SHOULDER ARTHROSCOPY WITH INFERIOR LABRAL REPAIR   WISDOM TOOTH EXTRACTION       WRIST SURGERY Left      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         Social History         Socioeconomic History   Marital status: Single      Spouse name: Not on file   Number of children: Not on file   Years of education: Not on file   Highest education level: Not on file  Occupational History   Not on file  Tobacco Use   Smoking status: Never   Smokeless tobacco: Never  Vaping Use   Vaping status: Never Used  Substance and Sexual Activity   Alcohol use: Yes      Alcohol/week: 1.0 standard drink of alcohol      Types: 1 Cans of beer per week   Drug use: No   Sexual activity: Never  Other Topics Concern   Not on file  Social  History Narrative   Not on file    Social Drivers of Health    Financial Resource Strain: Not on file  Food Insecurity: Not on file  Transportation Needs: Not on file  Physical Activity: Not on file  Stress: Not on file  Social Connections: Not on file         Family History  Problem Relation Age of Onset   Thyroid  disease Mother     Hypothyroidism Mother     Osteoarthritis Mother     Fibromyalgia Mother     Restless legs syndrome Mother     Thyroid  cancer Mother 20        Papillary   Melanoma Mother 38   Colon polyps Mother          cscope every 3 years   Migraines Mother     Colon polyps Father     Colon cancer Maternal Grandmother 51   Kidney cancer Maternal Grandfather 21   Dementia Paternal Grandmother     Throat cancer Paternal Grandfather     Melanoma Paternal Grandfather  Breast cancer Other          Allergies       Allergies  Allergen Reactions   Betadine [Povidone-Iodine] Hives   Clarithromycin Anxiety, Nausea Only and Shortness Of Breath   Amphetamine-Dextroamphetamine        Other reaction(s): nausea   Cefaclor        REACTION: Hives Other reaction(s): hives   Food        Multiple Food Allergies: bell Peppers, Garlic, Shellfish, Pine Nuts, Peanuts, Soy, Melon, Watermelon, Honeydew, Cantaloupe, Squash, Pumpkin, Gourd, Zucchini   Meloxicam        Other reaction(s): migraine and hives   Peanuts [Peanut Oil]     Prednisone        Other reaction(s): tachycardia   Soy Allergy (Obsolete)     Coconut (Cocos Nucifera) Dermatitis, Hives and Rash   Latex Hives and Rash            Current Outpatient Medications  Medication Sig Dispense Refill   acetaminophen  (TYLENOL ) 500 MG tablet Take 1 tablet (500 mg total) by mouth every 8 (eight) hours for 10 days. 30 tablet 0   aspirin  EC 325 MG tablet Take 1 tablet (325 mg total) by mouth daily. 14 tablet 0   HYDROmorphone  (DILAUDID ) 2 MG tablet Take 1 tablet (2 mg total) by mouth every 4 (four) hours as  needed for severe pain (pain score 7-10). 15 tablet 0   ibuprofen  (ADVIL ) 800 MG tablet Take 1 tablet (800 mg total) by mouth every 8 (eight) hours for 10 days. Please take with food, please alternate with acetaminophen  30 tablet 0   ondansetron  (ZOFRAN -ODT) 8 MG disintegrating tablet Take 1 tablet (8 mg total) by mouth every 8 (eight) hours as needed for nausea or vomiting. (Patient not taking: Reported on 08/21/2023) 15 tablet 0   scopolamine  (TRANSDERM-SCOP) 1 MG/3DAYS Place 1 patch (1 mg total) onto the skin every 3 (three) days. 5 patch 12   albuterol  (ACCUNEB ) 0.63 MG/3ML nebulizer solution Take 1 ampule by nebulization every 6 (six) hours as needed for Wheezing.       aspirin  EC 325 MG tablet Take 1 tablet (325 mg total) by mouth daily. 14 tablet 0   Atogepant  (QULIPTA ) 60 MG TABS Take 1 tablet (60 mg total) by mouth daily. 30 tablet 11   azelastine (ASTELIN) 0.1 % nasal spray Place into both nostrils.       Baclofen 20 MG/20ML SOLN 2 (two) times daily as needed.       budesonide-formoterol (SYMBICORT) 160-4.5 MCG/ACT inhaler USE 2 PUFFS TWICE A DAY       celecoxib  (CELEBREX ) 100 MG capsule Take 1 capsule (100 mg total) by mouth 2 (two) times daily. 60 capsule 2   EPINEPHrine  0.3 mg/0.3 mL IJ SOAJ injection See admin instructions.   0   fexofenadine (ALLEGRA) 180 MG tablet Take 180 mg by mouth in the morning and at bedtime.       FLUoxetine (PROZAC) 20 MG capsule Take 20 mg by mouth every morning.   2   HYDROmorphone  (DILAUDID ) 2 MG tablet Take 1 tablet (2 mg total) by mouth every 4 (four) hours as needed for severe pain (pain score 7-10). (Patient not taking: Reported on 08/21/2023) 30 tablet 0   HYDROmorphone  (DILAUDID ) 2 MG tablet Take 1 tablet (2 mg total) by mouth every 4 (four) hours as needed for severe pain (pain score 7-10). (Patient not taking: Reported on 08/21/2023) 20 tablet 0   montelukast (SINGULAIR)  10 MG tablet Take 10 mg by mouth at bedtime.       Multiple Vitamin (MULTIVITAMIN)  capsule Take 1 capsule by mouth daily.       omeprazole (PRILOSEC) 20 MG capsule Take 20 mg by mouth daily.       scopolamine  (TRANSDERM-SCOP) 1 MG/3DAYS Place 1 patch (1.5 mg total) onto the skin every 3 (three) days. (Patient not taking: Reported on 08/21/2023) 10 patch 0   SPIRIVA RESPIMAT 1.25 MCG/ACT AERS SMARTSIG:2 Puff(s) Via Inhaler Daily       triamcinolone  (NASACORT ) 55 MCG/ACT AERO nasal inhaler Place 2 sprays into the nose daily.       UBRELVY  100 MG TABS Take 100 mg by mouth if needed as close to onset of migraine as possible. May repeat in 2 hours if headache persists or returns. Do not exceed 2 tablets in 24 hours. 10 tablet 11      No current facility-administered medications for this visit.      Imaging Results (Last 48 hours)  No results found.     Review of Systems:   A ROS was performed including pertinent positives and negatives as documented in the HPI.     Musculoskeletal Exam:         Shoulder with range of motion is to 165 degrees degrees with external rotation at the side to 80 degrees.  Internal rotation is to T1.  Negative sulcus on the right     Bilateral patellofemoral joint with significant 4+ laxity with the knee in full extension.  Otherwise range of motion is from 0 to 120 degrees.  There is positive patellar crepitus with grind bilaterally   Imaging:     MRI right knee, MRI left knee: Bilateral elevated TT-TG distance of 15 mm with lateral patellar subluxation with preserved patellofemoral space and atrophy and thinning of the MPFL bilaterally   I personally reviewed and interpreted the radiographs.     Assessment:   Status post right shoulder capsulorrhaphy doing extremely well.  Right shoulder is now doing extremely well.  I did also discuss that her MRIs of bilateral knees do show evidence of an elevated TT-TG distance of 15 consistent with her known history of patella subluxation of which she has more recently had an occurrence on the right  side.  Given her persistent patellar instability with multiple subluxations daily despite continued physical therapy and strengthening of the right leg we did continue to discuss MPFL reconstruction with active brace placement.  I do believe that ultimately she would do extremely well with this.  She has also trialed over-the-counter brace usage without effect.  I did discuss the risks and limitations as well as associated recovery.  After discussion she would like to proceed   Plan :     - Plan for right knee medial patellofemoral ligament reconstruction with active brace placement     After a lengthy discussion of treatment options, including risks, benefits, alternatives, complications of surgical and nonsurgical conservative options, the patient elected surgical repair.    The patient  is aware of the material risks  and complications including, but not limited to injury to adjacent structures, neurovascular injury, infection, numbness, bleeding, implant failure, thermal burns, stiffness, persistent pain, failure to heal, disease transmission from allograft, need for further surgery, dislocation, anesthetic risks, blood clots, risks of death,and others. The probabilities of surgical success and failure discussed with patient given their particular co-morbidities.The time and nature of expected rehabilitation and recovery was discussed.The patient's  questions were all answered preoperatively.  No barriers to understanding were noted. I explained the natural history of the disease process and Rx rationale.  I explained to the patient what I considered to be reasonable expectations given their personal situation.  The final treatment plan was arrived at through a shared patient decision making process model.               I personally saw and evaluated the patient, and participated in the management and treatment plan.   Elspeth Parker, MD Attending Physician, Orthopedic Surgery

## 2024-01-20 NOTE — Interval H&P Note (Signed)
 History and Physical Interval Note:  01/20/2024 8:44 AM  Pamela Norton  has presented today for surgery, with the diagnosis of RIGHT PATELLAR INSTABILITY.  The various methods of treatment have been discussed with the patient and family. After consideration of risks, benefits and other options for treatment, the patient has consented to  Procedure(s): ARTHROSCOPY, KNEE (Right) RECONSTRUCTION, LIGAMENT, MEDIAL PATELLOFEMORAL (Right) as a surgical intervention.  The patient's history has been reviewed, patient examined, no change in status, stable for surgery.  I have reviewed the patient's chart and labs.  Questions were answered to the patient's satisfaction.     [name]

## 2024-01-20 NOTE — Anesthesia Procedure Notes (Signed)
 Procedure Name: LMA Insertion Date/Time: 01/20/2024 10:01 AM  Performed by: Frost Kayla MATSU, CRNAPre-anesthesia Checklist: Patient identified, Emergency Drugs available, Suction available and Patient being monitored Patient Re-evaluated:Patient Re-evaluated prior to induction Oxygen Delivery Method: Circle system utilized Preoxygenation: Pre-oxygenation with 100% oxygen Induction Type: IV induction LMA: LMA inserted LMA Size: 4.0 Number of attempts: 1 Placement Confirmation: positive ETCO2 Tube secured with: Tape Dental Injury: Teeth and Oropharynx as per pre-operative assessment

## 2024-01-20 NOTE — Progress Notes (Signed)
 Assisted Dr. Ardeen Jourdain with right, adductor canal, ultrasound guided block. Side rails up, monitors on throughout procedure. See vital signs in flow sheet. Tolerated Procedure well.

## 2024-01-20 NOTE — Anesthesia Procedure Notes (Signed)
 Anesthesia Regional Block: Adductor canal block   Pre-Anesthetic Checklist: , timeout performed,  Correct Patient, Correct Site, Correct Laterality,  Correct Procedure, Correct Position, site marked,  Risks and benefits discussed,  Surgical consent,  Pre-op evaluation,  At surgeon's request and post-op pain management  Laterality: Right  Prep: chloraprep       Needles:  Injection technique: Single-shot  Needle Type: Echogenic Stimulator Needle     Needle Length: 9cm  Needle Gauge: 21     Additional Needles:   Procedures:,,,, ultrasound used (permanent image in chart),,    Narrative:  Start time: 01/20/2024 9:19 AM End time: 01/20/2024 9:23 AM Injection made incrementally with aspirations every 5 mL.  Performed by: Personally  Anesthesiologist: Peggye Delon Brunswick, MD  Additional Notes: Discussed risks and benefits of nerve block including, but not limited to, prolonged and/or permanent nerve injury involving sensory and/or motor function. Monitors were applied and a time-out was performed. The nerve and associated structures were visualized under ultrasound guidance. After negative aspiration, local anesthetic was slowly injected around the nerve. There was no evidence of high pressure during the procedure. There were no paresthesias. VSS remained stable and the patient tolerated the procedure well.

## 2024-01-20 NOTE — Anesthesia Preprocedure Evaluation (Addendum)
 Anesthesia Evaluation  Patient identified by MRN, date of birth, ID band Patient awake    Reviewed: Allergy & Precautions, NPO status , Patient's Chart, lab work & pertinent test results  History of Anesthesia Complications (+) PONV and history of anesthetic complications  Airway Mallampati: II  TM Distance: >3 FB Neck ROM: Full   Comment: Previous grade I view with MAC 3, easy mask Dental  (+) Dental Advisory Given   Pulmonary neg shortness of breath, asthma (used inhaler today) , neg sleep apnea, neg COPD, neg recent URI   Pulmonary exam normal breath sounds clear to auscultation       Cardiovascular negative cardio ROS  Rhythm:Regular Rate:Normal     Neuro/Psych  Headaches, neg Seizures PSYCHIATRIC DISORDERS Anxiety Depression       GI/Hepatic Neg liver ROS,GERD  Medicated,,Celiac disease   Endo/Other  negative endocrine ROS    Renal/GU negative Renal ROS     Musculoskeletal  (+) Arthritis ,    Abdominal   Peds  Hematology negative hematology ROS (+)   Anesthesia Other Findings Ehlers-Danlos syndrome  Reproductive/Obstetrics                              Anesthesia Physical Anesthesia Plan  ASA: 2  Anesthesia Plan: General   Post-op Pain Management: Tylenol  PO (pre-op)* and Regional block*   Induction: Intravenous  PONV Risk Score and Plan: 4 or greater and Ondansetron , Dexamethasone , Midazolam , Propofol  infusion, TIVA, Treatment may vary due to age or medical condition and Scopolamine  patch - Pre-op  Airway Management Planned: LMA  Additional Equipment:   Intra-op Plan:   Post-operative Plan: Extubation in OR  Informed Consent: I have reviewed the patients History and Physical, chart, labs and discussed the procedure including the risks, benefits and alternatives for the proposed anesthesia with the patient or authorized representative who has indicated his/her  understanding and acceptance.     Dental advisory given  Plan Discussed with: CRNA and Anesthesiologist  Anesthesia Plan Comments: (Discussed potential risks of nerve blocks including, but not limited to, infection, bleeding, nerve damage, seizures, pneumothorax, respiratory depression, and potential failure of the block. Alternatives to nerve blocks discussed. All questions answered.  Risks of general anesthesia discussed including, but not limited to, sore throat, hoarse voice, chipped/damaged teeth, injury to vocal cords, nausea and vomiting, allergic reactions, lung infection, heart attack, stroke, and death. All questions answered. )         Anesthesia Quick Evaluation

## 2024-01-21 ENCOUNTER — Encounter (HOSPITAL_BASED_OUTPATIENT_CLINIC_OR_DEPARTMENT_OTHER): Payer: Self-pay | Admitting: Orthopaedic Surgery

## 2024-01-21 ENCOUNTER — Telehealth: Payer: Self-pay

## 2024-01-21 NOTE — Telephone Encounter (Signed)
 Pt called triage stating that her nerve block wore off from surgery yesterday 01/20/24 and that her pain is sitting at a 5-6/10 even with her prescribed pain medication and icing. She was wondering what she could do to help with the pain.

## 2024-01-22 ENCOUNTER — Telehealth (HOSPITAL_BASED_OUTPATIENT_CLINIC_OR_DEPARTMENT_OTHER): Payer: Self-pay | Admitting: Orthopaedic Surgery

## 2024-01-22 NOTE — Telephone Encounter (Signed)
 Pt called triage stating that her nerve block wore off from surgery yesterday 01/20/24 and that her pain is sitting at a 5-6/10 even with her prescribed pain medication and icing. She was wondering what she could do to help with the pain.   Patient has also called this morning please advise. Best contact [phone]

## 2024-01-22 NOTE — Telephone Encounter (Signed)
 Leonce please advise since Dr Genelle is out

## 2024-01-22 NOTE — Telephone Encounter (Signed)
 Talked with patient on the phone, advised alternating Tylenol  and ibuprofen  every 4 hours in addition to the Dilaudid .  Also discussed not pushing into too much weightbearing until pain is more controlled.  Patient in agreement with plan and will keep us  updated on her symptoms over the coming days.

## 2024-01-22 NOTE — Telephone Encounter (Signed)
 This is being addressed in another message.

## 2024-01-23 ENCOUNTER — Encounter (HOSPITAL_BASED_OUTPATIENT_CLINIC_OR_DEPARTMENT_OTHER): Payer: Self-pay

## 2024-01-27 ENCOUNTER — Other Ambulatory Visit (HOSPITAL_BASED_OUTPATIENT_CLINIC_OR_DEPARTMENT_OTHER): Payer: Self-pay | Admitting: Orthopaedic Surgery

## 2024-01-27 ENCOUNTER — Encounter (HOSPITAL_BASED_OUTPATIENT_CLINIC_OR_DEPARTMENT_OTHER): Payer: Self-pay | Admitting: Orthopaedic Surgery

## 2024-01-27 DIAGNOSIS — M6281 Muscle weakness (generalized): Secondary | ICD-10-CM | POA: Diagnosis not present

## 2024-01-27 DIAGNOSIS — M25661 Stiffness of right knee, not elsewhere classified: Secondary | ICD-10-CM | POA: Diagnosis not present

## 2024-01-27 DIAGNOSIS — M25561 Pain in right knee: Secondary | ICD-10-CM | POA: Diagnosis not present

## 2024-01-27 DIAGNOSIS — Z4889 Encounter for other specified surgical aftercare: Secondary | ICD-10-CM | POA: Diagnosis not present

## 2024-01-31 DIAGNOSIS — J3081 Allergic rhinitis due to animal (cat) (dog) hair and dander: Secondary | ICD-10-CM | POA: Diagnosis not present

## 2024-01-31 DIAGNOSIS — M25561 Pain in right knee: Secondary | ICD-10-CM | POA: Diagnosis not present

## 2024-01-31 DIAGNOSIS — J301 Allergic rhinitis due to pollen: Secondary | ICD-10-CM | POA: Diagnosis not present

## 2024-01-31 DIAGNOSIS — Z4889 Encounter for other specified surgical aftercare: Secondary | ICD-10-CM | POA: Diagnosis not present

## 2024-01-31 DIAGNOSIS — J3089 Other allergic rhinitis: Secondary | ICD-10-CM | POA: Diagnosis not present

## 2024-01-31 DIAGNOSIS — M25661 Stiffness of right knee, not elsewhere classified: Secondary | ICD-10-CM | POA: Diagnosis not present

## 2024-01-31 DIAGNOSIS — M6281 Muscle weakness (generalized): Secondary | ICD-10-CM | POA: Diagnosis not present

## 2024-02-03 DIAGNOSIS — Z4889 Encounter for other specified surgical aftercare: Secondary | ICD-10-CM | POA: Diagnosis not present

## 2024-02-03 DIAGNOSIS — M25661 Stiffness of right knee, not elsewhere classified: Secondary | ICD-10-CM | POA: Diagnosis not present

## 2024-02-03 DIAGNOSIS — M6281 Muscle weakness (generalized): Secondary | ICD-10-CM | POA: Diagnosis not present

## 2024-02-03 DIAGNOSIS — M25561 Pain in right knee: Secondary | ICD-10-CM | POA: Diagnosis not present

## 2024-02-05 ENCOUNTER — Ambulatory Visit (INDEPENDENT_AMBULATORY_CARE_PROVIDER_SITE_OTHER): Admitting: Orthopaedic Surgery

## 2024-02-05 DIAGNOSIS — M25411 Effusion, right shoulder: Secondary | ICD-10-CM | POA: Diagnosis not present

## 2024-02-05 DIAGNOSIS — M25611 Stiffness of right shoulder, not elsewhere classified: Secondary | ICD-10-CM | POA: Diagnosis not present

## 2024-02-05 DIAGNOSIS — M25361 Other instability, right knee: Secondary | ICD-10-CM

## 2024-02-05 DIAGNOSIS — Z4789 Encounter for other orthopedic aftercare: Secondary | ICD-10-CM | POA: Diagnosis not present

## 2024-02-05 DIAGNOSIS — M25511 Pain in right shoulder: Secondary | ICD-10-CM | POA: Diagnosis not present

## 2024-02-05 NOTE — Progress Notes (Signed)
 Post Operative Evaluation    Procedure/Date of Surgery: Right knee MPFL reconstruction 11/3  Interval History:    Presents today 2 weeks status post above procedure.  Overall she is doing very well.  She is continuing to work with physical therapy   PMH/PSH/Family History/Social History/Meds/Allergies:    Past Medical History:  Diagnosis Date   Anxiety    Asthma    Celiac disease    Complication of anesthesia    Depression    Family history of breast cancer    Family history of colon cancer    Family history of colonic polyps    Family history of melanoma    Family history of thyroid  cancer    Migraines    PONV (postoperative nausea and vomiting)    Past Surgical History:  Procedure Laterality Date   MEDIAL PATELLOFEMORAL LIGAMENT REPAIR Right 01/20/2024   Procedure: RECONSTRUCTION, LIGAMENT, MEDIAL PATELLOFEMORAL;  Surgeon: Genelle Standing, MD;  Location: [facility];  Service: Orthopedics;  Laterality: Right;   SHOULDER ARTHROSCOPY WITH LABRAL REPAIR Right 07/30/2023   Procedure: ARTHROSCOPY, SHOULDER, WITH GLENOID LABRUM REPAIR;  Surgeon: Genelle Standing, MD;  Location: [facility];  Service: Orthopedics;  Laterality: Right;  RIGHT SHOULDER ARTHROSCOPY WITH INFERIOR LABRAL REPAIR   WISDOM TOOTH EXTRACTION     WRIST SURGERY Left    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    Social History   Socioeconomic History   Marital status: Single    Spouse name: Not on file   Number of children: Not on file   Years of education: Not on file   Highest education level: Not on file  Occupational History   Not on file  Tobacco Use   Smoking status: Never   Smokeless tobacco: Never  Vaping Use   Vaping status: Never Used  Substance and Sexual Activity   Alcohol use: Yes    Alcohol/week: 1.0 standard drink of alcohol    Types: 1 Cans of beer per week   Drug use: No   Sexual activity: Never  Other  Topics Concern   Not on file  Social History Narrative   Not on file   Social Drivers of Health   Financial Resource Strain: Not on file  Food Insecurity: Not on file  Transportation Needs: Not on file  Physical Activity: Not on file  Stress: Not on file  Social Connections: Not on file   Family History  Problem Relation Age of Onset   Thyroid  disease Mother    Hypothyroidism Mother    Osteoarthritis Mother    Fibromyalgia Mother    Restless legs syndrome Mother    Thyroid  cancer Mother 58       Papillary   Melanoma Mother 33   Colon polyps Mother        cscope every 3 years   Migraines Mother    Colon polyps Father    Colon cancer Maternal Grandmother 9   Kidney cancer Maternal Grandfather 68   Dementia Paternal Grandmother    Throat cancer Paternal Grandfather    Melanoma Paternal Grandfather    Breast cancer Other    Allergies  Allergen Reactions   Betadine [Povidone-Iodine] Hives   Clarithromycin Anxiety, Nausea Only and Shortness Of Breath   Amphetamine-Dextroamphetamine     Other reaction(s): nausea  Cefaclor     REACTION: Hives Other reaction(s): hives   Codeine Hives    Per patient report   Food     Multiple Food Allergies: bell Peppers, Garlic, Shellfish, Pine Nuts, Peanuts, Soy, Melon, Watermelon, Honeydew, Cantaloupe, Squash, Pumpkin, Gourd, Zucchini   Meloxicam     Other reaction(s): migraine and hives   Peanuts [Peanut Oil]    Prednisone     Other reaction(s): tachycardia   Soy Allergy (Obsolete)    Coconut (Cocos Nucifera) Dermatitis, Hives and Rash   Latex Hives and Rash   Current Outpatient Medications  Medication Sig Dispense Refill   ondansetron  (ZOFRAN -ODT) 8 MG disintegrating tablet Take 1 tablet (8 mg total) by mouth every 8 (eight) hours as needed for nausea or vomiting. (Patient not taking: Reported on 08/21/2023) 15 tablet 0   albuterol  (ACCUNEB ) 0.63 MG/3ML nebulizer solution Take 1 ampule by nebulization every 6 (six) hours as  needed for Wheezing.     aspirin  EC 325 MG tablet Take 1 tablet (325 mg total) by mouth daily. 14 tablet 0   Atogepant  (QULIPTA ) 60 MG TABS Take 1 tablet (60 mg total) by mouth daily. 30 tablet 11   azelastine (ASTELIN) 0.1 % nasal spray Place into both nostrils.     Baclofen 20 MG/20ML SOLN 2 (two) times daily as needed.     budesonide-formoterol (SYMBICORT) 160-4.5 MCG/ACT inhaler USE 2 PUFFS TWICE A DAY     celecoxib  (CELEBREX ) 100 MG capsule Take 1 capsule (100 mg total) by mouth 2 (two) times daily. 60 capsule 2   EPINEPHrine  0.3 mg/0.3 mL IJ SOAJ injection See admin instructions.  0   fexofenadine (ALLEGRA) 180 MG tablet Take 180 mg by mouth in the morning and at bedtime.     FLUoxetine (PROZAC) 20 MG capsule Take 20 mg by mouth every morning.  2   HYDROmorphone  (DILAUDID ) 2 MG tablet Take 1 tablet (2 mg total) by mouth every 4 (four) hours as needed for severe pain (pain score 7-10). (Patient not taking: Reported on 08/21/2023) 30 tablet 0   HYDROmorphone  (DILAUDID ) 2 MG tablet Take 1 tablet (2 mg total) by mouth every 4 (four) hours as needed for severe pain (pain score 7-10). (Patient not taking: Reported on 08/21/2023) 20 tablet 0   HYDROmorphone  (DILAUDID ) 2 MG tablet Take 1 tablet (2 mg total) by mouth every 4 (four) hours as needed for severe pain (pain score 7-10). 15 tablet 0   montelukast (SINGULAIR) 10 MG tablet Take 10 mg by mouth at bedtime.     Multiple Vitamin (MULTIVITAMIN) capsule Take 1 capsule by mouth daily.     omeprazole (PRILOSEC) 20 MG capsule Take 20 mg by mouth daily.     scopolamine  (TRANSDERM-SCOP) 1 MG/3DAYS Place 1 patch (1.5 mg total) onto the skin every 3 (three) days. (Patient not taking: Reported on 08/21/2023) 10 patch 0   scopolamine  (TRANSDERM-SCOP) 1 MG/3DAYS Place 1 patch (1 mg total) onto the skin every 3 (three) days. 5 patch 12   SPIRIVA RESPIMAT 1.25 MCG/ACT AERS SMARTSIG:2 Puff(s) Via Inhaler Daily     triamcinolone  (NASACORT ) 55 MCG/ACT AERO nasal  inhaler Place 2 sprays into the nose daily.     UBRELVY  100 MG TABS Take 100 mg by mouth if needed as close to onset of migraine as possible. May repeat in 2 hours if headache persists or returns. Do not exceed 2 tablets in 24 hours. 10 tablet 11   No current facility-administered medications for this visit.  No results found.  Review of Systems:   A ROS was performed including pertinent positives and negatives as documented in the HPI.   Musculoskeletal Exam:     Right knee incisions well-appearing without erythema or drainage.  No lateral patellar laxity.  Range of motion is from -3 to 1 to 30 degrees distal neurosensory exams intact  Imaging:      I personally reviewed and interpreted the radiographs.   Assessment:   2 weeks status post right knee MPFL reconstruction doing extremely well.  She will advance her weightbearing to as tolerated and I will plan to see her back in 4 weeks for reassessment  Plan :    - Return to clinic 4 weeks for reassessment      I personally saw and evaluated the patient, and participated in the management and treatment plan.  Elspeth Parker, MD Attending Physician, Orthopedic Surgery  This document was dictated using Dragon voice recognition software. A reasonable attempt at proof reading has been made to minimize errors.

## 2024-02-06 ENCOUNTER — Other Ambulatory Visit (HOSPITAL_COMMUNITY): Payer: Self-pay

## 2024-02-07 ENCOUNTER — Telehealth: Payer: Self-pay | Admitting: Pharmacist

## 2024-02-07 DIAGNOSIS — J3081 Allergic rhinitis due to animal (cat) (dog) hair and dander: Secondary | ICD-10-CM | POA: Diagnosis not present

## 2024-02-07 DIAGNOSIS — Z4889 Encounter for other specified surgical aftercare: Secondary | ICD-10-CM | POA: Diagnosis not present

## 2024-02-07 DIAGNOSIS — M6281 Muscle weakness (generalized): Secondary | ICD-10-CM | POA: Diagnosis not present

## 2024-02-07 DIAGNOSIS — J3089 Other allergic rhinitis: Secondary | ICD-10-CM | POA: Diagnosis not present

## 2024-02-07 DIAGNOSIS — J301 Allergic rhinitis due to pollen: Secondary | ICD-10-CM | POA: Diagnosis not present

## 2024-02-07 DIAGNOSIS — M25561 Pain in right knee: Secondary | ICD-10-CM | POA: Diagnosis not present

## 2024-02-07 DIAGNOSIS — M25661 Stiffness of right knee, not elsewhere classified: Secondary | ICD-10-CM | POA: Diagnosis not present

## 2024-02-07 NOTE — Telephone Encounter (Signed)
 Error

## 2024-02-09 DIAGNOSIS — M25661 Stiffness of right knee, not elsewhere classified: Secondary | ICD-10-CM | POA: Diagnosis not present

## 2024-02-09 DIAGNOSIS — M25561 Pain in right knee: Secondary | ICD-10-CM | POA: Diagnosis not present

## 2024-02-09 DIAGNOSIS — M6281 Muscle weakness (generalized): Secondary | ICD-10-CM | POA: Diagnosis not present

## 2024-02-09 DIAGNOSIS — Z4889 Encounter for other specified surgical aftercare: Secondary | ICD-10-CM | POA: Diagnosis not present

## 2024-02-11 DIAGNOSIS — Z4889 Encounter for other specified surgical aftercare: Secondary | ICD-10-CM | POA: Diagnosis not present

## 2024-02-11 DIAGNOSIS — M6281 Muscle weakness (generalized): Secondary | ICD-10-CM | POA: Diagnosis not present

## 2024-02-11 DIAGNOSIS — M25661 Stiffness of right knee, not elsewhere classified: Secondary | ICD-10-CM | POA: Diagnosis not present

## 2024-02-11 DIAGNOSIS — M25561 Pain in right knee: Secondary | ICD-10-CM | POA: Diagnosis not present

## 2024-02-12 ENCOUNTER — Encounter (HOSPITAL_BASED_OUTPATIENT_CLINIC_OR_DEPARTMENT_OTHER): Payer: Self-pay | Admitting: Orthopaedic Surgery

## 2024-02-12 DIAGNOSIS — M25411 Effusion, right shoulder: Secondary | ICD-10-CM | POA: Diagnosis not present

## 2024-02-12 DIAGNOSIS — M25611 Stiffness of right shoulder, not elsewhere classified: Secondary | ICD-10-CM | POA: Diagnosis not present

## 2024-02-12 DIAGNOSIS — Z4789 Encounter for other orthopedic aftercare: Secondary | ICD-10-CM | POA: Diagnosis not present

## 2024-02-12 DIAGNOSIS — M25511 Pain in right shoulder: Secondary | ICD-10-CM | POA: Diagnosis not present

## 2024-02-17 DIAGNOSIS — Z4889 Encounter for other specified surgical aftercare: Secondary | ICD-10-CM | POA: Diagnosis not present

## 2024-02-17 DIAGNOSIS — M6281 Muscle weakness (generalized): Secondary | ICD-10-CM | POA: Diagnosis not present

## 2024-02-17 DIAGNOSIS — M25661 Stiffness of right knee, not elsewhere classified: Secondary | ICD-10-CM | POA: Diagnosis not present

## 2024-02-17 DIAGNOSIS — M25561 Pain in right knee: Secondary | ICD-10-CM | POA: Diagnosis not present

## 2024-02-19 DIAGNOSIS — Z4789 Encounter for other orthopedic aftercare: Secondary | ICD-10-CM | POA: Diagnosis not present

## 2024-02-19 DIAGNOSIS — M25511 Pain in right shoulder: Secondary | ICD-10-CM | POA: Diagnosis not present

## 2024-02-19 DIAGNOSIS — M25611 Stiffness of right shoulder, not elsewhere classified: Secondary | ICD-10-CM | POA: Diagnosis not present

## 2024-02-19 DIAGNOSIS — M25411 Effusion, right shoulder: Secondary | ICD-10-CM | POA: Diagnosis not present

## 2024-02-20 DIAGNOSIS — I781 Nevus, non-neoplastic: Secondary | ICD-10-CM | POA: Diagnosis not present

## 2024-02-20 DIAGNOSIS — D225 Melanocytic nevi of trunk: Secondary | ICD-10-CM | POA: Diagnosis not present

## 2024-02-20 DIAGNOSIS — Z1283 Encounter for screening for malignant neoplasm of skin: Secondary | ICD-10-CM | POA: Diagnosis not present

## 2024-02-20 DIAGNOSIS — L905 Scar conditions and fibrosis of skin: Secondary | ICD-10-CM | POA: Diagnosis not present

## 2024-02-21 DIAGNOSIS — J3089 Other allergic rhinitis: Secondary | ICD-10-CM | POA: Diagnosis not present

## 2024-02-21 DIAGNOSIS — J3081 Allergic rhinitis due to animal (cat) (dog) hair and dander: Secondary | ICD-10-CM | POA: Diagnosis not present

## 2024-02-21 DIAGNOSIS — M25661 Stiffness of right knee, not elsewhere classified: Secondary | ICD-10-CM | POA: Diagnosis not present

## 2024-02-21 DIAGNOSIS — M25561 Pain in right knee: Secondary | ICD-10-CM | POA: Diagnosis not present

## 2024-02-21 DIAGNOSIS — Z4889 Encounter for other specified surgical aftercare: Secondary | ICD-10-CM | POA: Diagnosis not present

## 2024-02-21 DIAGNOSIS — J301 Allergic rhinitis due to pollen: Secondary | ICD-10-CM | POA: Diagnosis not present

## 2024-02-21 DIAGNOSIS — M6281 Muscle weakness (generalized): Secondary | ICD-10-CM | POA: Diagnosis not present

## 2024-02-24 DIAGNOSIS — Z4889 Encounter for other specified surgical aftercare: Secondary | ICD-10-CM | POA: Diagnosis not present

## 2024-02-24 DIAGNOSIS — M25561 Pain in right knee: Secondary | ICD-10-CM | POA: Diagnosis not present

## 2024-02-24 DIAGNOSIS — M25661 Stiffness of right knee, not elsewhere classified: Secondary | ICD-10-CM | POA: Diagnosis not present

## 2024-02-24 DIAGNOSIS — M6281 Muscle weakness (generalized): Secondary | ICD-10-CM | POA: Diagnosis not present

## 2024-02-26 DIAGNOSIS — M25611 Stiffness of right shoulder, not elsewhere classified: Secondary | ICD-10-CM | POA: Diagnosis not present

## 2024-02-26 DIAGNOSIS — M25411 Effusion, right shoulder: Secondary | ICD-10-CM | POA: Diagnosis not present

## 2024-02-26 DIAGNOSIS — M25511 Pain in right shoulder: Secondary | ICD-10-CM | POA: Diagnosis not present

## 2024-02-26 DIAGNOSIS — Z4789 Encounter for other orthopedic aftercare: Secondary | ICD-10-CM | POA: Diagnosis not present

## 2024-02-28 DIAGNOSIS — M25561 Pain in right knee: Secondary | ICD-10-CM | POA: Diagnosis not present

## 2024-02-28 DIAGNOSIS — M6281 Muscle weakness (generalized): Secondary | ICD-10-CM | POA: Diagnosis not present

## 2024-02-28 DIAGNOSIS — J3081 Allergic rhinitis due to animal (cat) (dog) hair and dander: Secondary | ICD-10-CM | POA: Diagnosis not present

## 2024-02-28 DIAGNOSIS — J3089 Other allergic rhinitis: Secondary | ICD-10-CM | POA: Diagnosis not present

## 2024-02-28 DIAGNOSIS — J301 Allergic rhinitis due to pollen: Secondary | ICD-10-CM | POA: Diagnosis not present

## 2024-02-28 DIAGNOSIS — M25661 Stiffness of right knee, not elsewhere classified: Secondary | ICD-10-CM | POA: Diagnosis not present

## 2024-02-28 DIAGNOSIS — Z4889 Encounter for other specified surgical aftercare: Secondary | ICD-10-CM | POA: Diagnosis not present

## 2024-03-02 DIAGNOSIS — M25561 Pain in right knee: Secondary | ICD-10-CM | POA: Diagnosis not present

## 2024-03-02 DIAGNOSIS — Z4889 Encounter for other specified surgical aftercare: Secondary | ICD-10-CM | POA: Diagnosis not present

## 2024-03-02 DIAGNOSIS — M6281 Muscle weakness (generalized): Secondary | ICD-10-CM | POA: Diagnosis not present

## 2024-03-02 DIAGNOSIS — M25661 Stiffness of right knee, not elsewhere classified: Secondary | ICD-10-CM | POA: Diagnosis not present

## 2024-03-04 DIAGNOSIS — M25511 Pain in right shoulder: Secondary | ICD-10-CM | POA: Diagnosis not present

## 2024-03-04 DIAGNOSIS — Z4789 Encounter for other orthopedic aftercare: Secondary | ICD-10-CM | POA: Diagnosis not present

## 2024-03-04 DIAGNOSIS — M25411 Effusion, right shoulder: Secondary | ICD-10-CM | POA: Diagnosis not present

## 2024-03-04 DIAGNOSIS — M25611 Stiffness of right shoulder, not elsewhere classified: Secondary | ICD-10-CM | POA: Diagnosis not present

## 2024-03-05 ENCOUNTER — Other Ambulatory Visit (HOSPITAL_BASED_OUTPATIENT_CLINIC_OR_DEPARTMENT_OTHER): Payer: Self-pay

## 2024-03-05 ENCOUNTER — Encounter (HOSPITAL_BASED_OUTPATIENT_CLINIC_OR_DEPARTMENT_OTHER): Admitting: Orthopaedic Surgery

## 2024-03-05 DIAGNOSIS — M25361 Other instability, right knee: Secondary | ICD-10-CM

## 2024-03-05 NOTE — Progress Notes (Signed)
 Post Operative Evaluation    Procedure/Date of Surgery: Right knee MPFL reconstruction 11/3  Interval History:    Presents today 6 weeks status post above procedure.  At this time she is working diligently on physical therapy with limited range of motion   PMH/PSH/Family History/Social History/Meds/Allergies:    Past Medical History:  Diagnosis Date   Anxiety    Asthma    Celiac disease    Complication of anesthesia    Depression    Family history of breast cancer    Family history of colon cancer    Family history of colonic polyps    Family history of melanoma    Family history of thyroid  cancer    Migraines    PONV (postoperative nausea and vomiting)    Past Surgical History:  Procedure Laterality Date   MEDIAL PATELLOFEMORAL LIGAMENT REPAIR Right 01/20/2024   Procedure: RECONSTRUCTION, LIGAMENT, MEDIAL PATELLOFEMORAL;  Surgeon: Genelle Standing, MD;  Location: [facility];  Service: Orthopedics;  Laterality: Right;   SHOULDER ARTHROSCOPY WITH LABRAL REPAIR Right 07/30/2023   Procedure: ARTHROSCOPY, SHOULDER, WITH GLENOID LABRUM REPAIR;  Surgeon: Genelle Standing, MD;  Location: [facility];  Service: Orthopedics;  Laterality: Right;  RIGHT SHOULDER ARTHROSCOPY WITH INFERIOR LABRAL REPAIR   WISDOM TOOTH EXTRACTION     WRIST SURGERY Left    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    Social History   Socioeconomic History   Marital status: Single    Spouse name: Not on file   Number of children: Not on file   Years of education: Not on file   Highest education level: Not on file  Occupational History   Not on file  Tobacco Use   Smoking status: Never   Smokeless tobacco: Never  Vaping Use   Vaping status: Never Used  Substance and Sexual Activity   Alcohol use: Yes    Alcohol/week: 1.0 standard drink of alcohol    Types: 1 Cans of beer per week   Drug use: No   Sexual activity: Never   Other Topics Concern   Not on file  Social History Narrative   Not on file   Social Drivers of Health   Tobacco Use: Low Risk (01/20/2024)   Patient History    Smoking Tobacco Use: Never    Smokeless Tobacco Use: Never    Passive Exposure: Not on file  Financial Resource Strain: Not on file  Food Insecurity: Not on file  Transportation Needs: Not on file  Physical Activity: Not on file  Stress: Not on file  Social Connections: Not on file  Depression (EYV7-0): Not on file  Alcohol Screen: Not on file  Housing: Not on file  Utilities: Not on file  Health Literacy: Not on file   Family History  Problem Relation Age of Onset   Thyroid  disease Mother    Hypothyroidism Mother    Osteoarthritis Mother    Fibromyalgia Mother    Restless legs syndrome Mother    Thyroid  cancer Mother 75       Papillary   Melanoma Mother 53   Colon polyps Mother        cscope every 3 years   Migraines Mother    Colon polyps Father    Colon cancer Maternal Grandmother 38   Kidney  cancer Maternal Grandfather 14   Dementia Paternal Grandmother    Throat cancer Paternal Grandfather    Melanoma Paternal Grandfather    Breast cancer Other    Allergies  Allergen Reactions   Betadine [Povidone-Iodine] Hives   Clarithromycin Anxiety, Nausea Only and Shortness Of Breath   Amphetamine-Dextroamphetamine     Other reaction(s): nausea   Cefaclor     REACTION: Hives Other reaction(s): hives   Codeine Hives    Per patient report   Food     Multiple Food Allergies: bell Peppers, Garlic, Shellfish, Pine Nuts, Peanuts, Soy, Melon, Watermelon, Honeydew, Cantaloupe, Squash, Pumpkin, Gourd, Zucchini   Meloxicam     Other reaction(s): migraine and hives   Peanuts [Peanut Oil]    Prednisone     Other reaction(s): tachycardia   Soy Allergy (Obsolete)    Coconut (Cocos Nucifera) Dermatitis, Hives and Rash   Latex Hives and Rash   Current Outpatient Medications  Medication Sig Dispense Refill    ondansetron  (ZOFRAN -ODT) 8 MG disintegrating tablet Take 1 tablet (8 mg total) by mouth every 8 (eight) hours as needed for nausea or vomiting. (Patient not taking: Reported on 08/21/2023) 15 tablet 0   albuterol  (ACCUNEB ) 0.63 MG/3ML nebulizer solution Take 1 ampule by nebulization every 6 (six) hours as needed for Wheezing.     aspirin  EC 325 MG tablet Take 1 tablet (325 mg total) by mouth daily. 14 tablet 0   Atogepant  (QULIPTA ) 60 MG TABS Take 1 tablet (60 mg total) by mouth daily. 30 tablet 11   azelastine (ASTELIN) 0.1 % nasal spray Place into both nostrils.     Baclofen 20 MG/20ML SOLN 2 (two) times daily as needed.     budesonide-formoterol (SYMBICORT) 160-4.5 MCG/ACT inhaler USE 2 PUFFS TWICE A DAY     celecoxib  (CELEBREX ) 100 MG capsule Take 1 capsule (100 mg total) by mouth 2 (two) times daily. 60 capsule 2   EPINEPHrine  0.3 mg/0.3 mL IJ SOAJ injection See admin instructions.  0   fexofenadine (ALLEGRA) 180 MG tablet Take 180 mg by mouth in the morning and at bedtime.     FLUoxetine (PROZAC) 20 MG capsule Take 20 mg by mouth every morning.  2   HYDROmorphone  (DILAUDID ) 2 MG tablet Take 1 tablet (2 mg total) by mouth every 4 (four) hours as needed for severe pain (pain score 7-10). (Patient not taking: Reported on 08/21/2023) 30 tablet 0   HYDROmorphone  (DILAUDID ) 2 MG tablet Take 1 tablet (2 mg total) by mouth every 4 (four) hours as needed for severe pain (pain score 7-10). (Patient not taking: Reported on 08/21/2023) 20 tablet 0   HYDROmorphone  (DILAUDID ) 2 MG tablet Take 1 tablet (2 mg total) by mouth every 4 (four) hours as needed for severe pain (pain score 7-10). 15 tablet 0   montelukast (SINGULAIR) 10 MG tablet Take 10 mg by mouth at bedtime.     Multiple Vitamin (MULTIVITAMIN) capsule Take 1 capsule by mouth daily.     omeprazole (PRILOSEC) 20 MG capsule Take 20 mg by mouth daily.     scopolamine  (TRANSDERM-SCOP) 1 MG/3DAYS Place 1 patch (1.5 mg total) onto the skin every 3 (three)  days. (Patient not taking: Reported on 08/21/2023) 10 patch 0   scopolamine  (TRANSDERM-SCOP) 1 MG/3DAYS Place 1 patch (1 mg total) onto the skin every 3 (three) days. 5 patch 12   SPIRIVA RESPIMAT 1.25 MCG/ACT AERS SMARTSIG:2 Puff(s) Via Inhaler Daily     triamcinolone  (NASACORT ) 55 MCG/ACT AERO nasal inhaler  Place 2 sprays into the nose daily.     UBRELVY  100 MG TABS Take 100 mg by mouth if needed as close to onset of migraine as possible. May repeat in 2 hours if headache persists or returns. Do not exceed 2 tablets in 24 hours. 10 tablet 11   No current facility-administered medications for this visit.   No results found.  Review of Systems:   A ROS was performed including pertinent positives and negatives as documented in the HPI.   Musculoskeletal Exam:     Right knee incisions well-appearing without erythema or drainage.  No lateral patellar laxity.  Range of motion is from 0 to 50 degrees distal neurosensory exams intact  Imaging:      I personally reviewed and interpreted the radiographs.   Assessment:   6 weeks status post right knee MPFL reconstruction doing extremely well.  At this time she is continue to work diligently through physical therapy although she is continue to have somewhat of a block to range of motion.  Given this I have recommended an intra-articular ultrasound-guided injection of steroid to increase the pliability of her scar tissue to allow for additional gains to be made  Plan :    - Return to clinic 4 weeks for reassessment      I personally saw and evaluated the patient, and participated in the management and treatment plan.  Elspeth Parker, MD Attending Physician, Orthopedic Surgery  This document was dictated using Dragon voice recognition software. A reasonable attempt at proof reading has been made to minimize errors.

## 2024-03-06 DIAGNOSIS — J301 Allergic rhinitis due to pollen: Secondary | ICD-10-CM | POA: Diagnosis not present

## 2024-03-06 DIAGNOSIS — M25561 Pain in right knee: Secondary | ICD-10-CM | POA: Diagnosis not present

## 2024-03-06 DIAGNOSIS — M6281 Muscle weakness (generalized): Secondary | ICD-10-CM | POA: Diagnosis not present

## 2024-03-06 DIAGNOSIS — J3089 Other allergic rhinitis: Secondary | ICD-10-CM | POA: Diagnosis not present

## 2024-03-06 DIAGNOSIS — M25661 Stiffness of right knee, not elsewhere classified: Secondary | ICD-10-CM | POA: Diagnosis not present

## 2024-03-06 DIAGNOSIS — J3081 Allergic rhinitis due to animal (cat) (dog) hair and dander: Secondary | ICD-10-CM | POA: Diagnosis not present

## 2024-03-06 DIAGNOSIS — Z4889 Encounter for other specified surgical aftercare: Secondary | ICD-10-CM | POA: Diagnosis not present

## 2024-03-09 DIAGNOSIS — M25661 Stiffness of right knee, not elsewhere classified: Secondary | ICD-10-CM | POA: Diagnosis not present

## 2024-03-09 DIAGNOSIS — M6281 Muscle weakness (generalized): Secondary | ICD-10-CM | POA: Diagnosis not present

## 2024-03-09 DIAGNOSIS — M25561 Pain in right knee: Secondary | ICD-10-CM | POA: Diagnosis not present

## 2024-03-09 DIAGNOSIS — Z4889 Encounter for other specified surgical aftercare: Secondary | ICD-10-CM | POA: Diagnosis not present

## 2024-03-11 DIAGNOSIS — J3081 Allergic rhinitis due to animal (cat) (dog) hair and dander: Secondary | ICD-10-CM | POA: Diagnosis not present

## 2024-03-11 DIAGNOSIS — M25661 Stiffness of right knee, not elsewhere classified: Secondary | ICD-10-CM | POA: Diagnosis not present

## 2024-03-11 DIAGNOSIS — Z4889 Encounter for other specified surgical aftercare: Secondary | ICD-10-CM | POA: Diagnosis not present

## 2024-03-11 DIAGNOSIS — J3089 Other allergic rhinitis: Secondary | ICD-10-CM | POA: Diagnosis not present

## 2024-03-11 DIAGNOSIS — M25561 Pain in right knee: Secondary | ICD-10-CM | POA: Diagnosis not present

## 2024-03-11 DIAGNOSIS — J301 Allergic rhinitis due to pollen: Secondary | ICD-10-CM | POA: Diagnosis not present

## 2024-03-11 DIAGNOSIS — M6281 Muscle weakness (generalized): Secondary | ICD-10-CM | POA: Diagnosis not present

## 2024-03-13 DIAGNOSIS — J4540 Moderate persistent asthma, uncomplicated: Secondary | ICD-10-CM | POA: Diagnosis not present

## 2024-03-16 ENCOUNTER — Encounter (HOSPITAL_BASED_OUTPATIENT_CLINIC_OR_DEPARTMENT_OTHER): Payer: Self-pay | Admitting: Orthopaedic Surgery

## 2024-03-16 DIAGNOSIS — Z4889 Encounter for other specified surgical aftercare: Secondary | ICD-10-CM | POA: Diagnosis not present

## 2024-03-16 DIAGNOSIS — M25561 Pain in right knee: Secondary | ICD-10-CM | POA: Diagnosis not present

## 2024-03-16 DIAGNOSIS — M6281 Muscle weakness (generalized): Secondary | ICD-10-CM | POA: Diagnosis not present

## 2024-03-16 DIAGNOSIS — M25661 Stiffness of right knee, not elsewhere classified: Secondary | ICD-10-CM | POA: Diagnosis not present

## 2024-03-17 ENCOUNTER — Telehealth: Admitting: Neurology

## 2024-03-17 DIAGNOSIS — G43709 Chronic migraine without aura, not intractable, without status migrainosus: Secondary | ICD-10-CM

## 2024-03-17 DIAGNOSIS — G43E09 Chronic migraine with aura, not intractable, without status migrainosus: Secondary | ICD-10-CM

## 2024-03-17 DIAGNOSIS — G43109 Migraine with aura, not intractable, without status migrainosus: Secondary | ICD-10-CM

## 2024-03-17 MED ORDER — QULIPTA 60 MG PO TABS: 60.0000 mg | ORAL_TABLET | Freq: Every day | ORAL | 11 refills | Status: AC

## 2024-03-17 MED ORDER — UBRELVY 100 MG PO TABS: ORAL_TABLET | ORAL | 11 refills | Status: AC

## 2024-03-17 NOTE — Progress Notes (Signed)
 "  Patient: Pamela Norton Date of Birth: Jan 09, 1991  Reason for Visit: Follow up History from: Patient Primary Neurologist: Ahern/[facility]   Virtual Visit via Video Note  I connected with Pamela Norton on 03/17/2024 at  2:45 PM EST by a video enabled telemedicine application and verified that I am speaking with the correct person using two identifiers.  Location: Patient: at her home Provider: in the office    I discussed the limitations of evaluation and management by telemedicine and the availability of in person appointments. The patient expressed understanding and agreed to proceed.  ASSESSMENT AND PLAN 33 y.o. year old female   1.  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 Advised to follow up with her birth control prescriber -Previously tried and failed: Emgality  (wearing off), Topamax  (drowsiness, slowed cognition), Ajovy  (insurance preference), Nurtec (insurance preference), amitriptyline (side effects), Wellbutrin, Celexa, Lexapro, Imitrex, Relpax - Follow up in 1 year or sooner if needed   There is increased risk for stroke in women with migraine with aura and a contraindication for the combined contraceptive pill for use by women who have migraine with aura. The risk for women with migraine without aura is lower. However other risk factors like smoking are far more likely to increase stroke risk than migraine. There is a recommendation for no smoking and for the use of OCPs without estrogen such as progestogen only pills particularly for women with migraine with aura.SABRA People who have migraine headaches with auras  may be 3 times more likely to have a stroke caused by a blood clot, compared to migraine patients who don't see auras. Women who take hormone-replacement therapy may be 30 percent more likely to suffer a clot-based stroke than women not taking medication containing estrogen. Other risk factors like smoking and high blood pressure may be  much more important. And stroke is still a rare complication due to migraine aura and is controversial and lower doses may not cause a risk.  HISTORY OF PRESENT ILLNESS: Today 03/17/2024 Update 03/17/24 SS: VV, migraines are better with Qulipta . Takes # 2 tablets of Ubrelvy  to knock it out. Having 1 migraine monthly with Qulipta ! She likes Qulipta  better than Emgality ! No side effects from Qulipta .   Update 08/21/23 SS: Remains on Emgality . Does notice it wears off towards the end of the injection cycle, takes a few days for it start working. Ubrelvy  works well, often has to take 2nd to get complete relief. On average 1-2 migraines every 2 weeks. She is experiencing some injection fatigue, the injection is painful.   Update 07/10/22 SS: At last visit in May 2023 Dr. Ines started Topamax  had drowsiness, slow cognition.  Started Ajovy  in May 2023.  Did 3 months, then switched to Emgality  for insurance reasons. Doing well on Emgality , few and less intense migraines. 2-3 migraines a month now, was having 8 or more a month. Has been noticing some insomnia after taking the Emgality  at night, yesterday she tried it in the morning. Recently we had to stop the Nurtec due to insurance, ordered Ubrelvy . 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  HISTORY  07/19/21 Dr. Ines: HPI:  Pamela Norton is a 33 y.o. female here as requested by Gerome Brunet, DO for migraines. PMHx migraine headaches, asthma, celiac disease, depression, idiopathic hypersomnia.  I reviewed Dr. Gerome notes, she was initially referred to [facility] per  patient's request for worsening migraine headaches, but she is here at [facility] for migraines.  She also has anxiety.  Hemoglobin A1c in June 2022 was 5.6, thyroid  was 5.01 which was elevated but free T4 was normal, LDL was 131 which was elevated, CBC was normal, CMP was unremarkable with BUN 9 and creatinine 0.68, physical examination was unremarkable, she has been started on Ubrelvy  in the past for her migraines and given samples, it appears she is also had Relpax on medication list, she has a history of migraines usually 1/month increased to 3-4, unknown triggers, Relpax not helping as much, described as throbbing sharp pain around the entire head, some nausea, no vision changes or vomiting, positive photophobia.  More recent labs include a normal free T4 all collected in February 2023, normal TSH, negative thyroid  antibody panel.   She has had migraines since HS since 14-15, mom has migraines and maternal father also had migraine. No visual aura but sometimes has some speech changes, usually this happens before the migraine 1-2, not every time but recurs in the exact same manner with multiple migraines had it at least with 5 migraines many more.She has never been able to pinpoint a trigger. It starts as a normal headahe and progresses. She may wake up with one. Pain is behind the eyes, sharp, throbbing/pulsating, +phono/photophobia, nausea, diarrhea, moderate to severe, occ vomiting, movement makes them worse, can last 24 hours. Not posotional but after she exercises she can have a headache ongoing 3-4 years. She did have an MRi in the past and it was negative. She has a lot of food allergies and celiac disease. For the last 2 years she gets 2 a week, 8 migraine days a month and > 15headache days a month (headaches+migraines). Relpax did not work. Ubrelvy  worked for a while and doesn't help. No other focal neurologic deficits, associated symptoms, inciting events or modifiable factors.    Propranolol contraindicated due to asthma, never been on topamax /topiramate , been on multiple depression medications prozac, amitriptyline(side effects could not tolerate), wellbutrin, celexa, lexapro, imitrex, relpax, aimovig contraindicated due to celiac disease and constipation.  REVIEW OF SYSTEMS: Out of a complete 14 system review of symptoms, the patient complains only of the following symptoms, and all other reviewed systems are negative.  See HPI  ALLERGIES: Allergies  Allergen Reactions   Betadine [Povidone-Iodine] Hives   Clarithromycin Anxiety, Nausea Only and Shortness Of Breath   Amphetamine-Dextroamphetamine     Other reaction(s): nausea   Cefaclor     REACTION: Hives Other reaction(s): hives   Codeine Hives    Per patient report   Food     Multiple Food Allergies: bell Peppers, Garlic, Shellfish, Pine Nuts, Peanuts, Soy, Melon, Watermelon, Honeydew, Cantaloupe, Squash, Pumpkin, Gourd, Zucchini   Meloxicam     Other reaction(s): migraine and hives   Peanuts [Peanut Oil]    Prednisone     Other reaction(s): tachycardia   Soy Allergy (Obsolete)    Coconut (Cocos Nucifera) Dermatitis, Hives and Rash   Latex Hives and Rash    HOME MEDICATIONS: Outpatient Medications Prior to Visit  Medication Sig Dispense Refill   ondansetron  (ZOFRAN -ODT) 8 MG disintegrating tablet Take 1 tablet (8  mg total) by mouth every 8 (eight) hours as needed for nausea or vomiting. (Patient not taking: Reported on 08/21/2023) 15 tablet 0   albuterol  (ACCUNEB ) 0.63 MG/3ML nebulizer solution Take 1 ampule by nebulization every 6 (six) hours as needed for Wheezing.     aspirin  EC 325 MG tablet Take 1 tablet (325 mg total) by mouth daily. 14 tablet 0   azelastine (ASTELIN) 0.1 % nasal spray Place into both nostrils.     Baclofen 20 MG/20ML SOLN 2 (two) times daily as needed.     budesonide-formoterol (SYMBICORT) 160-4.5 MCG/ACT inhaler USE 2 PUFFS TWICE A DAY     celecoxib  (CELEBREX ) 100 MG capsule  Take 1 capsule (100 mg total) by mouth 2 (two) times daily. 60 capsule 2   EPINEPHrine  0.3 mg/0.3 mL IJ SOAJ injection See admin instructions.  0   fexofenadine (ALLEGRA) 180 MG tablet Take 180 mg by mouth in the morning and at bedtime.     FLUoxetine (PROZAC) 20 MG capsule Take 20 mg by mouth every morning.  2   HYDROmorphone  (DILAUDID ) 2 MG tablet Take 1 tablet (2 mg total) by mouth every 4 (four) hours as needed for severe pain (pain score 7-10). (Patient not taking: Reported on 08/21/2023) 30 tablet 0   HYDROmorphone  (DILAUDID ) 2 MG tablet Take 1 tablet (2 mg total) by mouth every 4 (four) hours as needed for severe pain (pain score 7-10). (Patient not taking: Reported on 08/21/2023) 20 tablet 0   HYDROmorphone  (DILAUDID ) 2 MG tablet Take 1 tablet (2 mg total) by mouth every 4 (four) hours as needed for severe pain (pain score 7-10). 15 tablet 0   montelukast (SINGULAIR) 10 MG tablet Take 10 mg by mouth at bedtime.     Multiple Vitamin (MULTIVITAMIN) capsule Take 1 capsule by mouth daily.     omeprazole (PRILOSEC) 20 MG capsule Take 20 mg by mouth daily.     scopolamine  (TRANSDERM-SCOP) 1 MG/3DAYS Place 1 patch (1.5 mg total) onto the skin every 3 (three) days. (Patient not taking: Reported on 08/21/2023) 10 patch 0   scopolamine  (TRANSDERM-SCOP) 1 MG/3DAYS Place 1 patch (1 mg total) onto the skin every 3 (three) days. 5 patch 12   SPIRIVA RESPIMAT 1.25 MCG/ACT AERS SMARTSIG:2 Puff(s) Via Inhaler Daily     triamcinolone  (NASACORT ) 55 MCG/ACT AERO nasal inhaler Place 2 sprays into the nose daily.     Atogepant  (QULIPTA ) 60 MG TABS Take 1 tablet (60 mg total) by mouth daily. 30 tablet 11   UBRELVY  100 MG TABS Take 100 mg by mouth if needed as close to onset of migraine as possible. May repeat in 2 hours if headache persists or returns. Do not exceed 2 tablets in 24 hours. 10 tablet 11   No facility-administered medications prior to visit.    PAST MEDICAL HISTORY: Past Medical History:  Diagnosis  Date   Anxiety    Asthma    Celiac disease    Complication of anesthesia    Depression    Family history of breast cancer    Family history of colon cancer    Family history of colonic polyps    Family history of melanoma    Family history of thyroid  cancer    Migraines    PONV (postoperative nausea and vomiting)     PAST SURGICAL HISTORY: Past Surgical History:  Procedure Laterality Date   MEDIAL PATELLOFEMORAL LIGAMENT REPAIR Right 01/20/2024   Procedure: RECONSTRUCTION, LIGAMENT, MEDIAL PATELLOFEMORAL;  Surgeon: Genelle Standing, MD;  Location: [facility];  Service: Orthopedics;  Laterality: Right;   SHOULDER ARTHROSCOPY WITH LABRAL REPAIR Right 07/30/2023   Procedure: ARTHROSCOPY, SHOULDER, WITH GLENOID LABRUM REPAIR;  Surgeon: Genelle Standing, MD;  Location:  SURGERY CENTER;  Service: Orthopedics;  Laterality: Right;  RIGHT SHOULDER ARTHROSCOPY WITH INFERIOR LABRAL REPAIR   WISDOM TOOTH EXTRACTION     WRIST SURGERY Left    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     FAMILY HISTORY: Family History  Problem Relation Age of Onset   Thyroid  disease Mother    Hypothyroidism Mother    Osteoarthritis Mother    Fibromyalgia Mother    Restless legs syndrome Mother    Thyroid  cancer Mother 45       Papillary   Melanoma Mother 4   Colon polyps Mother        cscope every 3 years   Migraines Mother    Colon polyps Father    Colon cancer Maternal Grandmother 43   Kidney cancer Maternal Grandfather 49   Dementia Paternal Grandmother    Throat cancer Paternal Grandfather    Melanoma Paternal Grandfather    Breast cancer Other     SOCIAL HISTORY: Social History   Socioeconomic History   Marital status: Single    Spouse name: Not on file   Number of children: Not on file   Years of education: Not on file   Highest education level: Not on file  Occupational History   Not on file  Tobacco Use   Smoking status: Never   Smokeless tobacco:  Never  Vaping Use   Vaping status: Never Used  Substance and Sexual Activity   Alcohol use: Yes    Alcohol/week: 1.0 standard drink of alcohol    Types: 1 Cans of beer per week   Drug use: No   Sexual activity: Never  Other Topics Concern   Not on file  Social History Narrative   Not on file   Social Drivers of Health   Tobacco Use: Low Risk (01/20/2024)   Patient History    Smoking Tobacco Use: Never    Smokeless Tobacco Use: Never    Passive Exposure: Not on file  Financial Resource Strain: Not on file  Food Insecurity: Not on file  Transportation Needs: Not on file  Physical Activity: Not on file  Stress: Not on file  Social Connections: Not on file  Intimate Partner Violence: Not on file  Depression (EYV7-0): Not on file  Alcohol Screen: Not on file  Housing: Not on file  Utilities: Not on file  Health Literacy: Not on file   PHYSICAL EXAM  Virtual Visit   DIAGNOSTIC DATA (LABS, IMAGING, TESTING) - I reviewed patient records, labs, notes, testing and imaging myself where available.  Lab Results  Component Value Date   WBC 7.2 02/13/2016   HGB 13.5 02/13/2016   HCT 40.7 02/13/2016   MCV 91.3 02/13/2016   PLT 369 02/13/2016      Component Value Date/Time   NA 141 02/13/2016 1603   K 4.4 02/13/2016 1603   CL 106 02/13/2016 1603   CO2 26 02/13/2016 1603   GLUCOSE 81 02/13/2016 1603   BUN 9 02/13/2016 1603   CREATININE 0.64 02/13/2016 1603   CALCIUM 9.6 02/13/2016 1603   PROT 7.1 02/13/2016 1603   ALBUMIN 4.3 02/13/2016 1603   AST 18 02/13/2016 1603   ALT 13 02/13/2016 1603   ALKPHOS 45 02/13/2016 1603   BILITOT 0.3 02/13/2016 1603  No results found for: CHOL, HDL, LDLCALC, LDLDIRECT, TRIG, CHOLHDL No results found for: YHAJ8R No results found for: [id number] Lab Results  Component Value Date   TSH 1.98 02/13/2016   Lauraine Born, AGNP-C, DNP 03/17/2024, 3:11 PM [facility] [address], [address] [place], [place] [address] [phone]  "

## 2024-03-17 NOTE — Patient Instructions (Signed)
 Great to see you today! Continue current medications Call for worsening headaches Follow-up in 1 year.  Thanks!!

## 2024-03-18 DIAGNOSIS — Z4889 Encounter for other specified surgical aftercare: Secondary | ICD-10-CM | POA: Diagnosis not present

## 2024-03-18 DIAGNOSIS — M6281 Muscle weakness (generalized): Secondary | ICD-10-CM | POA: Diagnosis not present

## 2024-03-18 DIAGNOSIS — M25561 Pain in right knee: Secondary | ICD-10-CM | POA: Diagnosis not present

## 2024-03-18 DIAGNOSIS — M25661 Stiffness of right knee, not elsewhere classified: Secondary | ICD-10-CM | POA: Diagnosis not present

## 2024-04-08 ENCOUNTER — Other Ambulatory Visit (HOSPITAL_BASED_OUTPATIENT_CLINIC_OR_DEPARTMENT_OTHER): Payer: Self-pay

## 2024-04-08 ENCOUNTER — Ambulatory Visit (INDEPENDENT_AMBULATORY_CARE_PROVIDER_SITE_OTHER): Admitting: Orthopaedic Surgery

## 2024-04-08 DIAGNOSIS — M25361 Other instability, right knee: Secondary | ICD-10-CM

## 2024-04-08 MED ORDER — TRAMADOL HCL 50 MG PO TABS
50.0000 mg | ORAL_TABLET | Freq: Four times a day (QID) | ORAL | 0 refills | Status: AC | PRN
  Filled 2024-04-08: qty 20, 5d supply, fill #0

## 2024-04-08 NOTE — Progress Notes (Signed)
 "                                Post Operative Evaluation    Procedure/Date of Surgery: Right knee MPFL reconstruction 11/3  Interval History:    Presents today 12 weeks status post above procedure.  She is now at approximately 93 degrees of flexion   PMH/PSH/Family History/Social History/Meds/Allergies:    Past Medical History:  Diagnosis Date   Anxiety    Asthma    Celiac disease    Complication of anesthesia    Depression    Family history of breast cancer    Family history of colon cancer    Family history of colonic polyps    Family history of melanoma    Family history of thyroid  cancer    Migraines    PONV (postoperative nausea and vomiting)    Past Surgical History:  Procedure Laterality Date   MEDIAL PATELLOFEMORAL LIGAMENT REPAIR Right 01/20/2024   Procedure: RECONSTRUCTION, LIGAMENT, MEDIAL PATELLOFEMORAL;  Surgeon: Genelle Standing, MD;  Location: [facility];  Service: Orthopedics;  Laterality: Right;   SHOULDER ARTHROSCOPY WITH LABRAL REPAIR Right 07/30/2023   Procedure: ARTHROSCOPY, SHOULDER, WITH GLENOID LABRUM REPAIR;  Surgeon: Genelle Standing, MD;  Location: [facility];  Service: Orthopedics;  Laterality: Right;  RIGHT SHOULDER ARTHROSCOPY WITH INFERIOR LABRAL REPAIR   WISDOM TOOTH EXTRACTION     WRIST SURGERY Left    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    Social History   Socioeconomic History   Marital status: Single    Spouse name: Not on file   Number of children: Not on file   Years of education: Not on file   Highest education level: Not on file  Occupational History   Not on file  Tobacco Use   Smoking status: Never   Smokeless tobacco: Never  Vaping Use   Vaping status: Never Used  Substance and Sexual Activity   Alcohol use: Yes    Alcohol/week: 1.0 standard drink of alcohol    Types: 1 Cans of beer per week   Drug use: No   Sexual activity: Never  Other Topics Concern   Not on file   Social History Narrative   Not on file   Social Drivers of Health   Tobacco Use: Low Risk (01/20/2024)   Patient History    Smoking Tobacco Use: Never    Smokeless Tobacco Use: Never    Passive Exposure: Not on file  Financial Resource Strain: Not on file  Food Insecurity: Not on file  Transportation Needs: Not on file  Physical Activity: Not on file  Stress: Not on file  Social Connections: Not on file  Depression (EYV7-0): Not on file  Alcohol Screen: Not on file  Housing: Not on file  Utilities: Not on file  Health Literacy: Not on file   Family History  Problem Relation Age of Onset   Thyroid  disease Mother    Hypothyroidism Mother    Osteoarthritis Mother    Fibromyalgia Mother    Restless legs syndrome Mother    Thyroid  cancer Mother 31       Papillary   Melanoma Mother 50   Colon polyps Mother        cscope every 3 years   Migraines Mother    Colon polyps Father    Colon cancer Maternal Grandmother 16   Kidney cancer Maternal Grandfather 21  Dementia Paternal Grandmother    Throat cancer Paternal Grandfather    Melanoma Paternal Grandfather    Breast cancer Other    Allergies  Allergen Reactions   Betadine [Povidone-Iodine] Hives   Clarithromycin Anxiety, Nausea Only and Shortness Of Breath   Amphetamine-Dextroamphetamine     Other reaction(s): nausea   Cefaclor     REACTION: Hives Other reaction(s): hives   Codeine Hives    Per patient report   Food     Multiple Food Allergies: bell Peppers, Garlic, Shellfish, Pine Nuts, Peanuts, Soy, Melon, Watermelon, Honeydew, Cantaloupe, Squash, Pumpkin, Gourd, Zucchini   Meloxicam     Other reaction(s): migraine and hives   Peanuts [Peanut Oil]    Prednisone     Other reaction(s): tachycardia   Soy Allergy (Obsolete)    Coconut (Cocos Nucifera) Dermatitis, Hives and Rash   Latex Hives and Rash   Current Outpatient Medications  Medication Sig Dispense Refill   ondansetron  (ZOFRAN -ODT) 8 MG  disintegrating tablet Take 1 tablet (8 mg total) by mouth every 8 (eight) hours as needed for nausea or vomiting. (Patient not taking: Reported on 08/21/2023) 15 tablet 0   traMADol  (ULTRAM ) 50 MG tablet Take 1 tablet (50 mg total) by mouth every 6 (six) hours as needed. 20 tablet 0   albuterol  (ACCUNEB ) 0.63 MG/3ML nebulizer solution Take 1 ampule by nebulization every 6 (six) hours as needed for Wheezing.     aspirin  EC 325 MG tablet Take 1 tablet (325 mg total) by mouth daily. 14 tablet 0   Atogepant  (QULIPTA ) 60 MG TABS Take 1 tablet (60 mg total) by mouth daily. 30 tablet 11   azelastine (ASTELIN) 0.1 % nasal spray Place into both nostrils.     Baclofen 20 MG/20ML SOLN 2 (two) times daily as needed.     budesonide-formoterol (SYMBICORT) 160-4.5 MCG/ACT inhaler USE 2 PUFFS TWICE A DAY     celecoxib  (CELEBREX ) 100 MG capsule Take 1 capsule (100 mg total) by mouth 2 (two) times daily. 60 capsule 2   EPINEPHrine  0.3 mg/0.3 mL IJ SOAJ injection See admin instructions.  0   fexofenadine (ALLEGRA) 180 MG tablet Take 180 mg by mouth in the morning and at bedtime.     FLUoxetine (PROZAC) 20 MG capsule Take 20 mg by mouth every morning.  2   HYDROmorphone  (DILAUDID ) 2 MG tablet Take 1 tablet (2 mg total) by mouth every 4 (four) hours as needed for severe pain (pain score 7-10). (Patient not taking: Reported on 08/21/2023) 30 tablet 0   HYDROmorphone  (DILAUDID ) 2 MG tablet Take 1 tablet (2 mg total) by mouth every 4 (four) hours as needed for severe pain (pain score 7-10). (Patient not taking: Reported on 08/21/2023) 20 tablet 0   HYDROmorphone  (DILAUDID ) 2 MG tablet Take 1 tablet (2 mg total) by mouth every 4 (four) hours as needed for severe pain (pain score 7-10). 15 tablet 0   montelukast (SINGULAIR) 10 MG tablet Take 10 mg by mouth at bedtime.     Multiple Vitamin (MULTIVITAMIN) capsule Take 1 capsule by mouth daily.     omeprazole (PRILOSEC) 20 MG capsule Take 20 mg by mouth daily.     scopolamine   (TRANSDERM-SCOP) 1 MG/3DAYS Place 1 patch (1.5 mg total) onto the skin every 3 (three) days. (Patient not taking: Reported on 08/21/2023) 10 patch 0   scopolamine  (TRANSDERM-SCOP) 1 MG/3DAYS Place 1 patch (1 mg total) onto the skin every 3 (three) days. 5 patch 12   SPIRIVA RESPIMAT 1.25  MCG/ACT AERS SMARTSIG:2 Puff(s) Via Inhaler Daily     triamcinolone  (NASACORT ) 55 MCG/ACT AERO nasal inhaler Place 2 sprays into the nose daily.     UBRELVY  100 MG TABS Take 100 mg by mouth if needed as close to onset of migraine as possible. May repeat in 2 hours if headache persists or returns. Do not exceed 2 tablets in 24 hours. 10 tablet 11   No current facility-administered medications for this visit.   No results found.  Review of Systems:   A ROS was performed including pertinent positives and negatives as documented in the HPI.   Musculoskeletal Exam:     Right knee incisions well-appearing without erythema or drainage.  No lateral patellar laxity.  Range of motion is from 0 to 93 degrees distal neurosensory exams intact  Imaging:      I personally reviewed and interpreted the radiographs.   Assessment:   12 weeks status post right knee MPFL reconstruction with persistent improvements in flexion of the knee.  She is now approximately 93 degrees.  I did describe that I would like to observe her over the course of the next month to see if there is steady improvement to weekly.  Discussed that should she not be able to progress to 100 degrees on her own we may need to consider arthroscopic lysis of adhesions and manipulation under anesthesia  Plan :    - Return to clinic 6 weeks for reassessment      I personally saw and evaluated the patient, and participated in the management and treatment plan.  Elspeth Parker, MD Attending Physician, Orthopedic Surgery  This document was dictated using Dragon voice recognition software. A reasonable attempt at proof reading has been made to minimize  errors. "

## 2024-04-09 ENCOUNTER — Encounter: Payer: Self-pay | Admitting: Neurology

## 2024-04-09 ENCOUNTER — Telehealth: Payer: Self-pay | Admitting: *Deleted

## 2024-04-09 NOTE — Telephone Encounter (Signed)
 Patient sent my chart message that PA is needed for Qulipta 

## 2024-04-10 ENCOUNTER — Other Ambulatory Visit (HOSPITAL_COMMUNITY): Payer: Self-pay

## 2024-04-10 ENCOUNTER — Telehealth: Payer: Self-pay | Admitting: Pharmacy Technician

## 2024-04-10 NOTE — Telephone Encounter (Signed)
 PA has been submitted, and telephone encounter has been created. Please see telephone encounter dated 1.23.26.

## 2024-04-10 NOTE — Telephone Encounter (Signed)
 Pharmacy Patient Advocate Encounter   Received notification from Pt Calls Messages that prior authorization for QULIPTA  60MG  is required/requested.   Insurance verification completed.   The patient is insured through [employer].   Per test claim: PA required; PA submitted to above mentioned insurance via Latent Key/confirmation #/[id number] Status is pending

## 2024-05-20 ENCOUNTER — Encounter (HOSPITAL_BASED_OUTPATIENT_CLINIC_OR_DEPARTMENT_OTHER): Admitting: Orthopaedic Surgery

## 2025-03-18 ENCOUNTER — Ambulatory Visit: Admitting: Neurology
